# Patient Record
Sex: Female | Born: 1960
Health system: Southern US, Community
[De-identification: ages and names within clinical notes are randomized; demographics above are authoritative.]

## PROBLEM LIST (undated history)

## (undated) DIAGNOSIS — Z9109 Other allergy status, other than to drugs and biological substances: Secondary | ICD-10-CM

## (undated) DIAGNOSIS — Z973 Presence of spectacles and contact lenses: Secondary | ICD-10-CM

## (undated) DIAGNOSIS — M179 Osteoarthritis of knee, unspecified: Secondary | ICD-10-CM

## (undated) DIAGNOSIS — B259 Cytomegaloviral disease, unspecified: Secondary | ICD-10-CM

## (undated) DIAGNOSIS — S83207A Unspecified tear of unspecified meniscus, current injury, left knee, initial encounter: Secondary | ICD-10-CM

## (undated) DIAGNOSIS — K589 Irritable bowel syndrome without diarrhea: Secondary | ICD-10-CM

## (undated) DIAGNOSIS — M1710 Unilateral primary osteoarthritis, unspecified knee: Secondary | ICD-10-CM

## (undated) DIAGNOSIS — S0300XA Dislocation of jaw, unspecified side, initial encounter: Secondary | ICD-10-CM

## (undated) HISTORY — PX: URETERAL REIMPLANTION: SHX2611

## (undated) HISTORY — DX: Cytomegaloviral disease, unspecified: B25.9

## (undated) HISTORY — PX: APPENDECTOMY: SHX54

## (undated) HISTORY — DX: Irritable bowel syndrome, unspecified: K58.9

---

## 1998-02-26 ENCOUNTER — Other Ambulatory Visit: Admission: RE | Admit: 1998-02-26 | Discharge: 1998-02-26 | Payer: Self-pay | Admitting: *Deleted

## 1999-03-07 ENCOUNTER — Other Ambulatory Visit: Admission: RE | Admit: 1999-03-07 | Discharge: 1999-03-07 | Payer: Self-pay | Admitting: *Deleted

## 2001-05-25 ENCOUNTER — Other Ambulatory Visit: Admission: RE | Admit: 2001-05-25 | Discharge: 2001-05-25 | Payer: Self-pay | Admitting: Gynecology

## 2001-06-03 ENCOUNTER — Encounter: Payer: Self-pay | Admitting: Gynecology

## 2001-06-03 ENCOUNTER — Ambulatory Visit (HOSPITAL_COMMUNITY): Admission: RE | Admit: 2001-06-03 | Discharge: 2001-06-03 | Payer: Self-pay | Admitting: Gynecology

## 2001-11-16 ENCOUNTER — Encounter: Payer: Self-pay | Admitting: Emergency Medicine

## 2001-11-16 ENCOUNTER — Emergency Department (HOSPITAL_COMMUNITY): Admission: EM | Admit: 2001-11-16 | Discharge: 2001-11-16 | Payer: Self-pay | Admitting: Emergency Medicine

## 2002-04-12 ENCOUNTER — Ambulatory Visit (HOSPITAL_COMMUNITY): Admission: RE | Admit: 2002-04-12 | Discharge: 2002-04-12 | Payer: Self-pay | Admitting: Gynecology

## 2002-04-12 ENCOUNTER — Encounter: Payer: Self-pay | Admitting: Gynecology

## 2002-04-20 ENCOUNTER — Inpatient Hospital Stay (HOSPITAL_COMMUNITY): Admission: RE | Admit: 2002-04-20 | Discharge: 2002-04-22 | Payer: Self-pay | Admitting: Gynecology

## 2002-04-20 ENCOUNTER — Encounter (INDEPENDENT_AMBULATORY_CARE_PROVIDER_SITE_OTHER): Payer: Self-pay | Admitting: Specialist

## 2002-04-20 HISTORY — PX: OTHER SURGICAL HISTORY: SHX169

## 2002-06-23 ENCOUNTER — Other Ambulatory Visit: Admission: RE | Admit: 2002-06-23 | Discharge: 2002-06-23 | Payer: Self-pay | Admitting: Gynecology

## 2003-07-17 ENCOUNTER — Other Ambulatory Visit: Admission: RE | Admit: 2003-07-17 | Discharge: 2003-07-17 | Payer: Self-pay | Admitting: Gynecology

## 2004-02-01 ENCOUNTER — Encounter (INDEPENDENT_AMBULATORY_CARE_PROVIDER_SITE_OTHER): Payer: Self-pay | Admitting: *Deleted

## 2004-02-01 ENCOUNTER — Ambulatory Visit (HOSPITAL_COMMUNITY): Admission: RE | Admit: 2004-02-01 | Discharge: 2004-02-01 | Payer: Self-pay | Admitting: *Deleted

## 2004-04-23 ENCOUNTER — Ambulatory Visit (HOSPITAL_COMMUNITY): Admission: RE | Admit: 2004-04-23 | Discharge: 2004-04-23 | Payer: Self-pay | Admitting: Gynecology

## 2004-05-01 ENCOUNTER — Encounter: Admission: RE | Admit: 2004-05-01 | Discharge: 2004-05-01 | Payer: Self-pay | Admitting: Gynecology

## 2004-09-04 ENCOUNTER — Other Ambulatory Visit: Admission: RE | Admit: 2004-09-04 | Discharge: 2004-09-04 | Payer: Self-pay | Admitting: Gynecology

## 2005-09-16 ENCOUNTER — Other Ambulatory Visit: Admission: RE | Admit: 2005-09-16 | Discharge: 2005-09-16 | Payer: Self-pay | Admitting: Gynecology

## 2005-10-31 ENCOUNTER — Ambulatory Visit (HOSPITAL_BASED_OUTPATIENT_CLINIC_OR_DEPARTMENT_OTHER): Admission: RE | Admit: 2005-10-31 | Discharge: 2005-10-31 | Payer: Self-pay | Admitting: Gynecology

## 2005-10-31 ENCOUNTER — Encounter (INDEPENDENT_AMBULATORY_CARE_PROVIDER_SITE_OTHER): Payer: Self-pay | Admitting: *Deleted

## 2005-10-31 HISTORY — PX: COMBINED LAPAROSCOPY W/ HYSTEROSCOPY: SUR299

## 2006-04-14 ENCOUNTER — Encounter: Admission: RE | Admit: 2006-04-14 | Discharge: 2006-04-14 | Payer: Self-pay | Admitting: Gynecology

## 2007-01-28 ENCOUNTER — Other Ambulatory Visit: Admission: RE | Admit: 2007-01-28 | Discharge: 2007-01-28 | Payer: Self-pay | Admitting: Gynecology

## 2007-07-05 ENCOUNTER — Ambulatory Visit: Payer: Self-pay | Admitting: Cardiology

## 2007-07-08 ENCOUNTER — Ambulatory Visit: Payer: Self-pay

## 2008-03-06 ENCOUNTER — Emergency Department (HOSPITAL_BASED_OUTPATIENT_CLINIC_OR_DEPARTMENT_OTHER): Admission: EM | Admit: 2008-03-06 | Discharge: 2008-03-06 | Payer: Self-pay | Admitting: Emergency Medicine

## 2008-11-07 ENCOUNTER — Telehealth: Payer: Self-pay | Admitting: Family Medicine

## 2008-11-07 ENCOUNTER — Ambulatory Visit: Payer: Self-pay | Admitting: Family Medicine

## 2008-11-07 DIAGNOSIS — R5381 Other malaise: Secondary | ICD-10-CM | POA: Insufficient documentation

## 2008-11-07 DIAGNOSIS — R5383 Other fatigue: Secondary | ICD-10-CM

## 2008-11-08 LAB — CONVERTED CEMR LAB
Albumin: 4.4 g/dL (ref 3.5–5.2)
Alkaline Phosphatase: 70 units/L (ref 39–117)
BUN: 21 mg/dL (ref 6–23)
CO2: 25 meq/L (ref 19–32)
Calcium: 9.2 mg/dL (ref 8.4–10.5)
Chloride: 108 meq/L (ref 96–112)
Cholesterol: 184 mg/dL (ref 0–200)
Glucose, Bld: 110 mg/dL — ABNORMAL HIGH (ref 70–99)
HDL: 48 mg/dL (ref >39)
Hemoglobin: 13.4 g/dL (ref 12.0–15.0)
MCHC: 32.6 g/dL (ref 30.0–36.0)
Potassium: 4.5 meq/L (ref 3.5–5.3)
RBC: 4.6 M/uL (ref 3.87–5.11)
Triglycerides: 181 mg/dL — ABNORMAL HIGH (ref <150)
Vit D, 25-Hydroxy: 17 ng/mL — ABNORMAL LOW (ref 30–89)

## 2008-12-26 ENCOUNTER — Other Ambulatory Visit: Admission: RE | Admit: 2008-12-26 | Discharge: 2008-12-26 | Payer: Self-pay | Admitting: Gynecology

## 2008-12-26 ENCOUNTER — Ambulatory Visit: Payer: Self-pay | Admitting: Gynecology

## 2008-12-26 ENCOUNTER — Encounter: Payer: Self-pay | Admitting: Gynecology

## 2009-03-06 ENCOUNTER — Encounter: Admission: RE | Admit: 2009-03-06 | Discharge: 2009-03-06 | Payer: Self-pay | Admitting: Gynecology

## 2009-06-01 ENCOUNTER — Ambulatory Visit: Payer: Self-pay | Admitting: Gynecology

## 2009-06-05 ENCOUNTER — Ambulatory Visit: Payer: Self-pay | Admitting: Gynecology

## 2009-06-13 ENCOUNTER — Ambulatory Visit: Payer: Self-pay | Admitting: Gynecology

## 2010-01-01 ENCOUNTER — Ambulatory Visit: Payer: Self-pay | Admitting: Family Medicine

## 2010-01-01 DIAGNOSIS — L03319 Cellulitis of trunk, unspecified: Secondary | ICD-10-CM

## 2010-01-01 DIAGNOSIS — L02219 Cutaneous abscess of trunk, unspecified: Secondary | ICD-10-CM | POA: Insufficient documentation

## 2010-01-02 ENCOUNTER — Encounter: Payer: Self-pay | Admitting: Family Medicine

## 2010-07-17 ENCOUNTER — Telehealth: Payer: Self-pay | Admitting: Family Medicine

## 2010-07-17 ENCOUNTER — Ambulatory Visit: Payer: Self-pay | Admitting: Family Medicine

## 2010-07-17 DIAGNOSIS — R21 Rash and other nonspecific skin eruption: Secondary | ICD-10-CM | POA: Insufficient documentation

## 2010-07-18 ENCOUNTER — Encounter: Payer: Self-pay | Admitting: Family Medicine

## 2010-09-08 ENCOUNTER — Encounter: Payer: Self-pay | Admitting: Gynecology

## 2010-09-17 NOTE — Assessment & Plan Note (Signed)
Summary: abscess   Vital Signs:  Patient profile:   50 year old female Height:      63 inches Weight:      175 pounds BMI:     31.11 O2 Sat:      100 % on Room air Temp:     97.4 degrees F oral Pulse rate:   72 / minute BP sitting:   138 / 84  (left arm) Cuff size:   large  Vitals Entered By: Payton Spark CMA (Jan 01, 2010 48:44 PM)  O2 Flow:  Room air CC: ? abscess/bug bite on lower L back x 3 days. Has started draining.    Primary Care Provider:  Nani Gasser MD  CC:  ? abscess/bug bite on lower L back x 3 days. Has started draining. Marland Kitchen  History of Present Illness: 50 yo WF presents for an open wound over the L Lower back x 10 days that has opened and drained some yellow - clear fluid.  It is tender.  She had subjective fever and chills last night.  She is not using anything on it.  She works in Teacher, music.  Denies any bites.    Current Medications (verified): 1)  None  Allergies (verified): 1)  ! Darvocet  Past History:  Past Medical History: Reviewed history from 11/07/2008 and no changes required. Endometriosis - Did depo/Lupron in 2009  Social History: Reviewed history from 11/07/2008 and no changes required. 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  Come college. Certificate progrzam.  Married to [place] with 2 biologic and 2 step children.   Never Smoked Alcohol use-no Drug use-no Regular exercise-no  Review of Systems      See HPI  Physical Exam  General:  alert, well-developed, well-nourished, and well-hydrated.   Head:  normocephalic and atraumatic.   Skin:  color normal.   2.5 x 1.5 cm open abscess with shallow pink base over the L lower back with clear drainage.   Cervical Nodes:  No lymphadenopathy noted Psych:  good eye contact, not anxious appearing, and not depressed appearing.     Impression & Recommendations:  Problem # 1:  CELLULITIS AND ABSCESS OF TRUNK (ICD-682.2) Cleaned area with betadine and alcohol.  Opened with a TB syringe  (unroofed the top) to get a culture.  Lesion is flat and unable to I&D.  Will f/u cx results and start on Doxycycline.  Call if redenss/ pain/ fever.  Clean with antibacterial soap and water.  Cover with polysporin oitnment and a bandage. Her updated medication list for this problem includes:    Doxycycline Hyclate 100 Mg Caps (Doxycycline hyclate) .Marland Kitchen... 1 capsule by mouth two times a day x 10 days  Orders: T-Culture, Wound ([id number])  Complete Medication List: 1)  Doxycycline Hyclate 100 Mg Caps (Doxycycline hyclate) .Marland Kitchen.. 1 capsule by mouth two times a day x 10 days  Patient Instructions: 1)  Clean abscess area with warm soapy water. 2)  Cover with polysporin ointment and a bandaid. 3)  Take Doxycycline 2 x a day (with food) x 10 days. 4)  Will call you w/ culture results in 48 hrs. 5)  Use Motrin as needed for aches/ pains. Prescriptions: DOXYCYCLINE HYCLATE 100 MG CAPS (DOXYCYCLINE HYCLATE) 1 capsule by mouth two times a day x 10 days  #20 x 0   Entered and Authorized by:   Seymour Bars DO   Signed by:   Seymour Bars DO on 01/01/2010   Method used:   [facility]  [place] [phone] ([facility]       [address] [place], [place]  [address]       Ph: [phone] or [phone]       Fax: [phone]   RxID:   [id number]

## 2010-09-17 NOTE — Assessment & Plan Note (Signed)
Summary: RAsh   Vital Signs:  Patient profile:   50 year old female Height:      63 inches Weight:      174 pounds Pulse rate:   63 / minute BP sitting:   127 / 84  (right arm) Cuff size:   regular  Vitals Entered By: Avon Gully CMA, Duncan Dull) (July 17, 2010 11:43 AM) CC: red lesion on lower back that appeared last pm   Primary Care Provider:  Nani Gasser MD  CC:  red lesion on lower back that appeared last pm.  History of Present Illness: Lesion on her low back, feels likea bee sting. Not draining. This lesoin is in the exact same place as the lesion in May.  This si the thrid time had a lesion like this. with the previous two lesion she says after a few days it started draining on their own.  Typically it's a whitish or yellow drainage but then becomes clear or serous.  She said that the previous two lesions have resolved within one to two weeks.  This time she was placed on doxycycline and that did seem to clear it up pretty quickly.  The last episode was in May here in our office and she was placed on doxycycline.  She did have a culture that was positive for staph.  She denies any other infections.  She does feel slightly feverish and had some myalgias.  Current Medications (verified): 1)  None  Allergies (verified): 1)  ! Darvocet  Comments:  Nurse/Medical Assistant: The patient's medications and allergies were reviewed with the patient and were updated in the Medication and Allergy Lists. Avon Gully CMA, Duncan Dull) (July 17, 2010 11:44 AM)  Physical Exam  General:  Well-developed,well-nourished,in no acute distress; alert,appropriate and cooperative throughout examination Skin:  on her left leg back area she has an erythematous patch that approximately 3 x 4 to 5 cm.  Within that area she has very tiny flattened vesicles.  I did try it lanced a couple of these vesicles and collect a viral culture.  The sample was very small   Impression &  Recommendations:  Problem # 1:  SKIN RASH (ICD-782.1) based on exam and history I actually think this is herpes simplex virus.  I did do a herpes culture though I did not get a decent quantity of sample to send.  Thus if it is negative it doesn't rule out HSV.  A medical ahead and treat her with an antiviral as well as the doxycycline which she has used in the past.  I see no pus or drainage or any evidence that the lesion is going into the skin abscess.  The rash itself is very superficial Orders: T-Herpes Culture (Routine) ([id number])  Complete Medication List: 1)  Doxycycline Hyclate 100 Mg Caps (Doxycycline hyclate) .Marland Kitchen.. 1 capsule by mouth two times a day x 10 days 2)  Acyclovir 400 Mg Tabs (Acyclovir) .... Take 1 tablet by mouth three times a day for 5 days  Patient Instructions: 1)  We will call you with the culture results.  Prescriptions: DOXYCYCLINE HYCLATE 100 MG CAPS (DOXYCYCLINE HYCLATE) 1 capsule by mouth two times a day x 10 days  #20 x 0   Entered and Authorized by:   Nani Gasser MD   Signed by:   Nani Gasser MD on 07/17/2010   Method used:   [facility]        [facility] [phone][facility] (retail)  [address], [place]  [address]       Ph: [phone] or [phone]       Fax: [phone]   RxID:   [id number] ACYCLOVIR 400 MG TABS (ACYCLOVIR) Take 1 tablet by mouth three times a day for 5 days  #15 x 0   Entered and Authorized by:   Nani Gasser MD   Signed by:   Nani Gasser MD on 07/17/2010   Method used:   [facility]ronically to        [facility] [phone]* (retail)       [address] [place], [place]  [address]       Ph: [phone] or [phone]       Fax: [phone]   RxID:   [id number]    Orders Added: 1)  T-Herpes Culture (Routine) [[id number]] 2)  Est. Patient Level III [10258]

## 2010-09-17 NOTE — Progress Notes (Signed)
Summary: Staph inf on back  Phone Note Call from Patient   Caller: Patient Call For: Nani Gasser MD Summary of Call: Pt calls and states that the area on her left side of back is back- has had several times and treated for staph. Wanted to know if you would call in antibiotic for her since has came back again. Send to [facility] on [address] Initial call taken by: Kathlene November LPN,  July 17, 2010 8:30 AM  Follow-up for Phone Call        REally needs to come in.  Follow-up by: Nani Gasser MD,  July 17, 2010 8:54 AM  Additional Follow-up for Phone Call Additional follow up Details #1::        Called pt and notified her of above instructions. Scheduled her appt for 11:30 today Additional Follow-up by: Kathlene November LPN,  July 17, 2010 9:26 AM

## 2010-12-20 ENCOUNTER — Telehealth: Payer: Self-pay | Admitting: Family Medicine

## 2010-12-20 DIAGNOSIS — R928 Other abnormal and inconclusive findings on diagnostic imaging of breast: Secondary | ICD-10-CM

## 2010-12-20 NOTE — Telephone Encounter (Signed)
Pt called and needs an order to do diagnostic.  Original mammo showed mass LT chest wall, and they need to do diagnostic.  Pt would like an order for them to do it at [facility]/off [address]. Pt at work today [phone](downstairs). Plan:  Routed to Dr. Linford Arnold and Sue Lush, CMA Jarvis Newcomer, LPN Domingo Dimes

## 2010-12-23 ENCOUNTER — Telehealth: Payer: Self-pay | Admitting: Family Medicine

## 2010-12-23 NOTE — Telephone Encounter (Signed)
Called the patient at the work number [phone] to let her know that her diagnostic  mammography order is in the system for her to go for additional testing per her request. Plan:  Pt informed that order in system. Jarvis Newcomer, LPN Domingo Dimes

## 2010-12-23 NOTE — Telephone Encounter (Signed)
Cara from imaging in [place] calling regarding order that is in the system regarding Jayleene Glaeser  DOB April 06, 2061 .  Please call. Plan:  Call returned to [place], and regarding order placed in [facility].for diagnostic mammo.  They want to know where the last imaging was done??  Plan:  Jason Nest pts work number and told her to call pt for specific information.  Charlynne Pander said she may need to call us back. Jarvis Newcomer, LPN Domingo Dimes

## 2010-12-24 ENCOUNTER — Telehealth: Payer: Self-pay | Admitting: Family Medicine

## 2010-12-24 NOTE — Telephone Encounter (Addendum)
Order needs to be resent please and say per [facility] [place] [facility].  Thanks. Diagnostic bilateral mammo. Plan:  Routed to Dr. Marlyne Beards, LPN Domingo Dimes   This was taken care of. Jarvis Newcomer, LPN Domingo Dimes

## 2010-12-26 ENCOUNTER — Telehealth: Payer: Self-pay | Admitting: Family Medicine

## 2010-12-26 ENCOUNTER — Other Ambulatory Visit: Payer: Self-pay | Admitting: Family Medicine

## 2010-12-26 DIAGNOSIS — N63 Unspecified lump in unspecified breast: Secondary | ICD-10-CM

## 2010-12-26 NOTE — Telephone Encounter (Signed)
Order resent

## 2010-12-26 NOTE — Telephone Encounter (Addendum)
Please change order in the system for this patient so she can have her test.  Order states pt to have diagnostic limited on RT. However, order needs to change to read:  Need bilateral diagnostic per Cara at imaging center.  2nd request. Plan:  Routed to Dr. Marlyne Beards, LPN Domingo Dimes  This encounter has long been taken care of.  Closed encounter. Jarvis Newcomer, LPN Domingo Dimes

## 2010-12-31 NOTE — Assessment & Plan Note (Signed)
[facility]                            CARDIOLOGY OFFICE NOTE   CYRENE, GHARIBIAN                    MRN:          [id number]  DATE:07/05/2007                            DOB:          06-09-1961    REFERRING PHYSICIAN:  Madlyn Frankel. Charlann Boxer, M.D.   REASON FOR CONSULTATION:  Evaluate the patient with chest pain,  cardiovascular risk factors.   HISTORY OF PRESENT ILLNESS:  The patient is a 50 year old white female  without prior cardiac history.  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  She is  complaining of chest discomfort.  This has been going on for about 5  months.  It is a substernal gripping discomfort.  It seems to come and  go.  She does notice it with emotional stress, though not with physical  stress.  She is able to do some jogging without bringing this on.  When  it does come on, it again is substernal.  There is no radiation to her  jaw or to her arms.  It can be 5/10 in intensity.  It can last from  minutes to hours.  There is no associated nausea, vomiting, or  diaphoresis.  She has never had this kind of discomfort before.  She has  not tried to take anything to get rid of it.  It does go away on its  own.  She does not notice it to be positional or associated with  respiration.   Of note, she did have syncope related to a GI illness in March.  At that  time, she was seen in the ER.  There was some chest pain.  She had a  spiral CT and did not have a pulmonary embolism or other abnormalities  apparently at that time.   PAST MEDICAL HISTORY:  Endometriosis.  The patient has no history of  hypertension, diabetes, or hyperlipidemia.   PAST SURGICAL HISTORY:  Two C-sections.  Transurethral mass resected,  apparently benign.   ALLERGIES:  None.   MEDICATIONS:  Depo-Lupron.  Progestin.   SOCIAL HISTORY:  The patient is an Publishing rights manager.  She is married.  She has  2 children.  She has never smoked cigarettes.  She does not drink  alcohol.   FAMILY HISTORY:  Contributory for coronary artery disease with her  father having heart disease in his 16s.   REVIEW OF SYSTEMS:  Positive for dizziness, deviated septum.  Negative  for other systems.   PHYSICAL EXAMINATION:  The patient is in no distress.  Blood pressure 131/72, heart rate 66 and regular, weight 161 pounds,  body mass index 28.  HEENT:  Eyelids unremarkable.  Pupils are equal, round, and reactive to  light and accommodation.  Fundi within normal limits.  Oral mucosa  unremarkable.  NECK:  No jugular venous distension at 45 degrees, carotid upstroke  brisk and symmetric, no bruits, thyromegaly.  LYMPHATICS:  No cervical, axillary, or inguinal adenopathy.  LUNGS:  Clear to auscultation bilaterally.  BACK:  No costovertebral angle tenderness.  CHEST:  Unremarkable.  HEART:  PMI not displaced or sustained, S1 and S2 within normal  limits,  no S3, no S4, no clicks, rubs, murmurs.  ABDOMEN:  Obese, positive bowel sounds, normal in frequency and pitch,  no bruits, rebound, guarding.  No midline pulsatile masses,  hepatomegaly, splenomegaly.  SKIN:  No rashes, no nodules.  EXTREMITIES:  With 2+ pulses throughout, no edema, cyanosis, clubbing.  NEURO:  Oriented to person, place, and time, cranial nerves 2-12 grossly  intact, motor grossly intact.   ASSESSMENT AND PLAN:  1. Chest discomfort.  The patient's chest discomfort has some typical      and worrisome features.  It has been increasing in intensity and      frequency.  She is on hormone replacement and has a family history.      Given all of this, I think the pre-test probability of obstructive      coronary disease is somewhat moderate.  She needs to be screened      and I will perform an exercise Cardiolite.  Further evaluation      based on these results.  2. Weight.  We discussed the fact that she is overweight.  She is well      aware of her body mass index and does exercise to try to improve      this.  3.  Risk reduction.  She says she does know her lipid profile and that      it has been normal.  I would be happy to review that at her      request.  4. Followup.  I will see the patient back based on the results of the      above.     Rollene Rotunda, MD, [facility]  Electronically Signed    JH/MedQ  DD: 07/05/2007  DT: 07/05/2007  Job #: [id number]   cc:   Madlyn Frankel Charlann Boxer, M.D.

## 2011-01-03 NOTE — H&P (Signed)
Jessica Mullen, Jessica Mullen             ACCOUNT NO.:  [id number]   MEDICAL RECORD NO.:  [id number]          PATIENT TYPE:  AMB   LOCATION:  [facility]                         FACILITY:  [facility]   PHYSICIAN:  Timothy P. Fontaine, M.D.DATE OF BIRTH:  1960-10-16   DATE OF ADMISSION:  DATE OF DISCHARGE:                                HISTORY & PHYSICAL   DATE OF ADMISSION:  October 31, 2005, at 7:30 a.m.   CHIEF COMPLAINT:  1.  Pelvic pain.  2.  Endometrial cells on Pap smear.   HISTORY OF PRESENT ILLNESS:  A 50 year old G2, P2 female. History of  endometriosis with endometrioma status post left salpingo-oophorectomy 2003.  Presents with worsening left-sided pelvic pain with intercourse as well as  increasing dysmenorrhea. The patient also notes this pain with defecation  during her menses and she is admitted at this time for laser laparoscopy.  The patient also on recent annual exam had benign-appearing endometrial  cells on Pap smear. She is for hysteroscopy D&C at the same time.   PAST MEDICAL HISTORY:  Mitral valve prolapse for which she takes no  antibiotic prophylaxis.   PAST SURGICAL HISTORY:  Includes left salpingo-oophorectomy, transurethral  resection of polyp, cesarean section x2, reimplantation of left ureter.   ALLERGIES:  None.   REVIEW OF SYSTEMS:  Noncontributory.   FAMILY HISTORY:  Noncontributory.   SOCIAL HISTORY:  Noncontributory.   ADMISSION PHYSICAL EXAMINATION:  VITAL SIGNS:  Afebrile, vital signs are  stable.  HEENT:  Normal.  LUNGS:  Clear.  CARDIAC:  Regular rate without rubs, murmurs, or gallops.  ABDOMEN:  Benign.  PELVIC:  External, BUS, vagina normal. Cervix normal. Uterus grossly normal  in size, midline and mobile, nontender. Adnexa without gross masses or  tenderness.   ASSESSMENT:  A 50 year old gravida 2, para 2 female, vasectomy birth  control, left-sided pelvic pain with intercourse, increasing dysmenorrhea,  pain with defecation during menses, all  reminiscent of her prior episode of  endometriosis, for laparoscopy. She also had benign-appearing endometrial  cells on Pap smear, for hysteroscopy D&C. Risks, benefits, indications, and  alternatives for the procedures were reviewed with the patient to include  the expected intraoperative/postoperative courses. The D&C portion,  hysteroscopy, as well as the laparoscopic portion, insufflation, trocar  placement, multiple port sites, use of sharp/blunt dissection,  electrocautery, and laser were all reviewed, understood, and accepted. The  patient understands that her pain may persist, worsen, or change following  the procedure. There are no guarantees as far as pain relief. We will assess  the pelvis. If pathology is encountered we will attempt to eradicate  pathology as is felt safe to do and she understands and accepts this. The  risks of infection - both internal requiring prolonged antibiotics as well  as incisional requiring opening and draining of incisions, closure by  secondary intention, was all discussed, understood, and accepted. The risk  of hemorrhage necessitating transfusion and the risks of transfusion  including transfusion reaction, hepatitis, HIV, mad cow disease, and other  unknown entities was all discussed, understood, and accepted. The risks of  inadvertant injury to internal organs -  both immediately recognized and  delay recognized including bowel, bladder, ureters, vessels and nerves  necessitating major exploratory reparative surgeries and further reparative  surgeries including ostomy formation was all discussed, understood, and  accepted. The patient's questions are answered to her satisfaction and she  is ready to proceed with surgery.      Timothy P. Fontaine, M.D.  Electronically Signed     TPF/MEDQ  D:  10/28/2005  T:  10/28/2005  Job:  [id number]

## 2011-01-03 NOTE — Op Note (Signed)
TNAMEBRIEL, GALLICCHIO                      ACCOUNT NO.:  [id number]   MEDICAL RECORD NO.:  [id number]                   PATIENT TYPE:  INP   LOCATION:  0478                                 FACILITY:  [facility]   PHYSICIAN:  Timothy P. Fontaine, M.D.           DATE OF BIRTH:  [birth year]   DATE OF PROCEDURE:  04/20/2002  DATE OF DISCHARGE:                                 OPERATIVE REPORT   PREOPERATIVE DIAGNOSES:  Left pelvic mass.   POSTOPERATIVE DIAGNOSES:  Endometriosis with left ovarian endometrioma.   PROCEDURE:  Exploratory laparotomy, left salpingo-oophorectomy, fulguration  endometriosis.   SURGEON:  Timothy P. Fontaine, M.D.   ASSISTANTGaetano Hawthorne. Lily Peer, M.D.   ANESTHESIA:  General endotracheal.   COMPLICATIONS:  None.   ESTIMATED BLOOD LOSS:  65 cc   SPECIMENS:  Opening cell washing left salpingo-oophorectomy.   FINDINGS:  Left ovary 9-10 cystic, adherent to the pelvic sidewall ruptured  upon dissection with chocolate-like material extruding. Left fallopian tube  attenuated with a hydrosalpinx type appearance, anterior cul-de-sac normal,  posterior cul-de-sac normal, uterus normal size, shape, contour. Right  fallopian tube normal __________ and caliber fimbriated end, right ovary  grossly normal in size with several areas of superficial endometriosis which  were superficially fulgurated.   DESCRIPTION OF PROCEDURE:  The patient was taken to the operating room,  placed in the low dorsal lithotomy position, received abdominoperineal  vaginal preparation with Betadine solution, bladder emptied with indwelling  Foley catheter placement sterile technique. The patient was draped in the  usual fashion and the abdomen was sharply entered through a Pfannenstiel  incision keeping adequate hemostasis at all levels. An opening cell washing  was then obtained and subsequently a Balfour retractor and bladder blade  were placed within the incision and the intestines were  packed in the  operative site. The sigmoid colon was noted to be adherent along the left  ovary and these adhesions were sharply lysed to free up the superior aspect  of the left cystic mass. The cystic mass was adherent to the pelvic sidewall  and during the elevation and dissection, the cystic mass was ruptured noting  to have endometriotic type fluid extruding. The resection was initiated at  the uterine ovarian pedicle and this was doubly clamped, cut and ligated  using #0 Vicryl suture. The mesosalpinx broad ligament were sharply incised  to the level of the pelvic sidewall and through sharp and blunt dissection,  the ovary and fallopian tube segment were freed to the level of the  infundibulopelvic ligament and vessels. The underlying great vessels and  ureter were identified and found to be away from the surgical site. The  infundibulopelvic ligament was then clamped, cut, and doubly ligated using  #0 Vicryl suture. The specimen was then sent for frozen section diagnosis  which ultimately returned consistent with endometriosis. The pelvis was  copiously irrigated, adequate hemostasis was visualized. The bowel packing  was removed,  Balfour retractor and bladder blade removed and the anterior  fascia was reapproximated using #0 Vicryl  suture starting at the angle meeting in the middle. The subcutaneous tissues  were irrigated, adequate hemostasis achieved with electrocautery. The skin  reapproximated with staples, sterile dressing applied. The patient was taken  to the recovery room in good condition having tolerated the procedure well.                                               Timothy P. Audie Box, M.D.    TPF/MEDQ  D:  04/20/2002  T:  04/20/2002  Job:  [id number]

## 2011-01-03 NOTE — H&P (Signed)
NAME:  Jessica Mullen, Jessica Mullen                       ACCOUNT NO.:  [id number]   MEDICAL RECORD NO.:  [id number]                   PATIENT TYPE:  INP   LOCATION:  NA                                   FACILITY:  [facility]   PHYSICIAN:  Timothy P. Fontaine, M.D.           DATE OF BIRTH:  [birth year]   DATE OF ADMISSION:  04/20/2002  DATE OF DISCHARGE:                                HISTORY & PHYSICAL   CHIEF COMPLAINT:  Pelvic mass.   HISTORY OF PRESENT ILLNESS:  The patient is a 50 year old G2, P2 female  presented April 07, 2002 complaining of left flank to low back pain.  Evaluation included an initial ultrasound which showed a large mixed echo  pattern left ovarian mass measuring 9.7 cm.  Right adnexa is normal.  Cul-de-  sac showed a moderate amount of fluid.  She subsequently underwent CT scan  which confirmed the left cystic pelvic mass with some ureteral deviation but  no compression and is otherwise normal showing no adenopathy.  Serum markers  to include CA 125, CEA, alpha-fetoprotein, beta hCG were all negative.  The  patient is admitted at this time for exploratory laparotomy, left salpingo-  oophorectomy and a possible debulking procedure.   PAST MEDICAL HISTORY:  Mitral valve prolapse for which she does not take  antibiotics.   PAST SURGICAL HISTORY:  Cesarean section x2 and a transurethral resection of  polyp.   CURRENT MEDICATION:  Tylox for pain.   SOCIAL HISTORY:  Noncontributory.   FAMILY HISTORY:  Noncontributory.   ADMISSION PHYSICAL EXAMINATION:  VITAL SIGNS: Stable.  HEENT: Normal.  LUNGS: Clear.  CARDIAC: Regular rate without rubs, murmurs, or gallops.  ABDOMEN: Benign.  No masses, guarding, rebound, or organomegaly.  PELVIC: External BUS vagina normal.  Cervix normal.  Bimanual with 10-weeks-  size mass.   ASSESSMENT:  The patient is a 50 year old G2, P2 female vasectomy birth  control with large left ovarian cystic mass causing her increasing pain.   PLAN:  The patient is admitted for exploratory laparotomy and probable left  salpingo-oophorectomy.  I reviewed the situation with the patient and her  husband and the various scenarios to include exploratory laparotomy with  removal of tube and ovary on that side.  If frozen section confirms it to be  benign then we will stop there.  If frozen section is suspicious for  carcinoma or is consistent with overt carcinoma then we will proceed with a  TAH/BSO, omentectomy, lymph node sampling, appendectomy, and resection of  any carcinoma to debulk her to include the potential for bowel resection.  The risks, benefits, indications and alternatives for the various potential  surgeries were reviewed with her to include the risks of bleeding,  transfusion including transfusion reaction, hepatitis, HIV, mad cow disease  and other unknown entities.  The risks of infection both internal requiring  prolonged antibiotics as well as abscess formation requiring reoperation and  drainage of  abscesses as well as wound complications requiring opening and  draining of incisions and closure by secondary intention.  The risks of  inadvertant injury to internal organs including bowel, bladder, ureters,  vessels, and nerves necessitating major exploratory reparative surgeries and  future reparative surgeries including ostomy formation was all discussed,  understood and accepted.  The patient is on a bowel prep for this potential.  The absolute sterility of hysterectomy was reviewed with her as well as  sexuality following hysterectomy to include the potential for orgasmic  dysfunction and persistent dyspareunia.  I also reviewed that there is an  oncologist on standby that if indeed it does turn out to be cancer we need  to proceed with a staging procedure that he will scrub into the case and  participate in her care and I reviewed the risks of lymphadenectomies to  include the potential for major vascular neural  injury as well as persistent  lymphedema following the procedure.  The patient's questions were answered  to her satisfaction and she is ready to proceed with surgery.                                               Timothy P. Audie Box, M.D.    TPF/MEDQ  D:  04/19/2002  T:  04/19/2002  Job:  [id number]

## 2011-01-03 NOTE — Op Note (Signed)
Jessica Mullen, Jessica Mullen             ACCOUNT NO.:  [id number]   MEDICAL RECORD NO.:  [id number]          PATIENT TYPE:  AMB   LOCATION:  [facility]                         FACILITY:  [facility]   PHYSICIAN:  Timothy P. Fontaine, M.D.DATE OF BIRTH:  [birth year]   DATE OF PROCEDURE:  10/31/2005  DATE OF DISCHARGE:                                 OPERATIVE REPORT   PREOPERATIVE DIAGNOSES:  1.  Pelvic pain.  2.  History of endometriosis.  3.  Endometrial cells on Pap smear.   POSTOPERATIVE DIAGNOSES:  1.  Pelvic pain, history of endometriosis.  2.  Endometrial cells on Pap smear.  3.  Omental to the anterior abdominal wall adhesions.  4.  Sigmoid colon to the left pelvic sidewall adhesion. s   PROCEDURE:  Hysteroscopy D&C, laparoscopic biopsy fulguration endometriosis.   SURGEON:  Timothy P. Fontaine, M.D.   ANESTHESIA:  General.   ESTIMATED BLOOD LOSS:  Minimal.   COMPLICATIONS:  None.   SORBITOL DISCREPANCY:  Reported at 65 mL   SPECIMEN:  1.  Endometrial curetting.  2.  Peritoneal biopsy endometriosis.   FINDINGS:  Hysteroscopic, adequate normal noting fundus, anterior-posterior  uterine surfaces, right and left tubal ostia, left lower uterine segment,  endocervical canal all visualized and normal. Laparoscopic anterior cul-de-  sac with several classic powder burn lesions vesicouterine fold  representative biopsy taken. Posterior cul-de-sac with classic powder burn  lesions on both inner aspects of the right and left uterosacral ligament,  uterus normal size, midline and mobile, left adnexa, sigmoid colon adherent  to the left sidewall, tube and ovary surgically absent, right fallopian tube  normal length, caliber fimbriated end. Right ovary grossly normal in size,  free and mobile with multiple brown endometriotic superficial implants on  the capsule. Right pelvic sidewall with several classic endometriotic powder  burn implants, upper abdominal exam with omental veil adhesed  along the  anterior abdominal wall from the infraumbilical region, liver smooth, no  abnormalities. Gallbladder not visualized, no other upper abdominal adhesive  disease noted.   PROCEDURE:  The patient was taken to the operating room, underwent general  anesthesia and was placed in the dorsal lithotomy position, received an  abdominoperineal vaginal preparation with Betadine solution. Bladder emptied  with in-and-out Foley catheterization. EUA performed and the patient was  draped in the usual fashion. Cervix was visualized with a speculum, anterior  lip grasped with a single-tooth tenaculum and the cervix was gently  gradually dilated to admit the diagnostic hysteroscope. Hysteroscopy was  performed which was normal. Subsequently a sharp curettage was performed and  specimen was sent to pathology. The Hulka tenaculum was placed through the  cervix after regloving and gowning. The laparoscopic portion was begun. A  vertical infraumbilical incision was made, the Veress needle placed,  position verified with water and approximately 2-1/2 liters of carbon  dioxide gas was infused without difficulty. Laparoscopic trocar was then  placed without difficulty, its position verified visually.  A right of  midline suprapubic 5 mm port was then placed under direct visualization  after transillumination for the vessels without difficulties. Examination of  the pelvic  organs upper abdominal exam was carried out with findings noted  above. A representative biopsy of the vesicouterine endometriotic implant  was taken and subsequently all visible powder burn endometriotic implants  were bipolar fulgurated superficially. The superficial capsular ovarian  endometriosis on the right was also fulgurated, several small cystic changes  which appeared physiologic were entered to assure that these were not small  endometriomas and they were all consistent with physiologic follicles.  During the entire  fulguration process, care was made to identify and avoid  vascular and ureteral structures. The left sidewall sigmoid area was  inspected and was found to be firmly adherent and was left undisturbed. The  omental veil adherent anteriorly was likewise inspected, there is no  evidence of bile involving the area and the veil was left undisturbed. The  right suprapubic port was removed, the gas allowed to slowly escape,  adequate hemostasis inspected at the port sites as well as the peritoneal  biopsy site. The infraumbilical port was then backed out under direct  visualization showing adequate hemostasis and no evidence of hernia  formation. A #0 Vicryl interrupted subcutaneous fascial stitch was placed  infraumbilically, both port sites injected using 0.25% Marcaine and both  skin incisions closed using Dermabond skin adhesive. The Hulka tenaculum was  removed. The patient placed in the supine position, awakened without  difficulty and taken to the recovery room in good condition having tolerated  the procedure well.      Timothy P. Fontaine, M.D.  Electronically Signed     TPF/MEDQ  D:  10/31/2005  T:  11/01/2005  Job:  [id number]

## 2011-01-06 ENCOUNTER — Ambulatory Visit
Admission: RE | Admit: 2011-01-06 | Discharge: 2011-01-06 | Disposition: A | Discharge status: Home / Self Care | Payer: PRIVATE HEALTH INSURANCE | Source: Ambulatory Visit | Attending: Family Medicine | Admitting: Family Medicine

## 2011-01-06 DIAGNOSIS — N63 Unspecified lump in unspecified breast: Secondary | ICD-10-CM

## 2011-01-07 ENCOUNTER — Telehealth: Payer: Self-pay | Admitting: Family Medicine

## 2011-01-07 NOTE — Telephone Encounter (Signed)
Call patient: Left breast ultrasound showed probably a fatty tumor called a lipoma. These are completely benign. They recommend is to followup mammogram in 1 year.

## 2011-01-08 NOTE — Telephone Encounter (Signed)
Called and left message on pt's cell with results and to call me back with any questions

## 2011-05-16 LAB — BASIC METABOLIC PANEL
BUN: 21
CO2: 29
Chloride: 104
Creatinine, Ser: 0.8
Potassium: 3.8

## 2011-05-16 LAB — PREGNANCY, URINE: Preg Test, Ur: NEGATIVE

## 2011-05-16 LAB — DIFFERENTIAL
Eosinophils Relative: 1
Lymphocytes Relative: 22
Lymphs Abs: 1.9
Monocytes Absolute: 0.5
Neutro Abs: 6.5

## 2011-05-16 LAB — CBC
HCT: 42
Hemoglobin: 14.7
RBC: 4.8
WBC: 8.9

## 2011-05-16 LAB — URINALYSIS, ROUTINE W REFLEX MICROSCOPIC
Glucose, UA: NEGATIVE
Hgb urine dipstick: NEGATIVE
Protein, ur: NEGATIVE
pH: 6.5

## 2011-08-27 ENCOUNTER — Encounter: Payer: Self-pay | Admitting: Emergency Medicine

## 2011-08-27 ENCOUNTER — Emergency Department
Admission: EM | Admit: 2011-08-27 | Discharge: 2011-08-27 | Disposition: A | Discharge status: Home / Self Care | Payer: PRIVATE HEALTH INSURANCE | Source: Home / Self Care | Attending: Family Medicine | Admitting: Family Medicine

## 2011-08-27 DIAGNOSIS — R6889 Other general symptoms and signs: Secondary | ICD-10-CM

## 2011-08-27 DIAGNOSIS — J111 Influenza due to unidentified influenza virus with other respiratory manifestations: Secondary | ICD-10-CM

## 2011-08-27 DIAGNOSIS — H6123 Impacted cerumen, bilateral: Secondary | ICD-10-CM

## 2011-08-27 DIAGNOSIS — H6120 Impacted cerumen, unspecified ear: Secondary | ICD-10-CM

## 2011-08-27 MED ORDER — AMOXICILLIN 875 MG PO TABS: 875.0000 mg | ORAL_TABLET | Freq: Two times a day (BID) | ORAL | Status: AC

## 2011-08-27 NOTE — ED Notes (Signed)
Began ear lavage and worked on Biomedical engineer for approximately 10 minutes each.  Removed a small amount of wax from each ear and was able to see the ear drum but patient was unable to tolerate the entire procedure so the full impaction was not removed.

## 2011-08-27 NOTE — ED Provider Notes (Signed)
History     CSN: [id number]  Arrival date & time 08/27/11  1118   First MD Initiated Contact with Patient 08/27/11 1237      Chief Complaint  Patient presents with  . URI      HPI Comments: HPI : Flu symptoms started 5 days ago.  Developed chills, sweats, myalgias, fatigue, headache, and nasal congestion.  Symptoms worsened and she developed a nonproductive cough.  Has decreased appetite but no nausea/vomiting.  She remains fatigued.  No pleuritic pain or shortness of breath.  She did have a flu shot this season. She notes that her ears feel more full than usual.     History reviewed. No pertinent past medical history.  Past Surgical History  Procedure Date  . Cesarean section   . Gyn surgery     Cystoscopy, ureter reimplantion, ovarian mass removal, oophrectomy, hysteroscopy    No family history on file.  History  Substance Use Topics  . Smoking status: Not on file  . Smokeless tobacco: Not on file  . Alcohol Use:     OB History    Grav Para Term Preterm Abortions TAB SAB Ect Mult Living                  Review of Systems + sore throat initially + cough No pleuritic pain No wheezing + nasal congestion ? post-nasal drainage No sinus pain/pressure No itchy/red eyes ? earache No hemoptysis No SOB No fever, + chills No nausea No vomiting No abdominal pain No diarrhea No urinary symptoms No skin rashes + fatigue + myalgias + headache Used OTC meds without relief  Allergies  Propoxyphene n-acetaminophen  Home Medications   Current Outpatient Rx  Name Route Sig Dispense Refill  . ACETAMINOPHEN 325 MG PO TABS Oral Take 650 mg by mouth every 6 (six) hours as needed.    Marland Kitchen PSEUDOEPHEDRINE-APAP-DM 13-086-57 MG/30ML PO LIQD Oral Take by mouth.    . AMOXICILLIN 875 MG PO TABS Oral Take 1 tablet (875 mg total) by mouth 2 (two) times daily. 20 tablet 0    BP 124/81  Pulse 70  Temp(Src) 98.2 F (36.8 C) (Tympanic)  Resp 16  Ht 5\' 3"  (1.6 m)  Wt 151  lb (68.493 kg)  BMI 26.75 kg/m2  SpO2 99%  Physical Exam Nursing notes and Vital Signs reviewed. Appearance:  Patient appears healthy, stated age, and in no acute distress Eyes:  Pupils are equal, round, and reactive to light and accomodation.  Extraocular movement is intact.  Conjunctivae are not inflamed  Ears:  Canals occluded bilaterally with cerumen.  Unable to clear with lavage Nose:  Mildly congested turbinates.  No sinus tenderness.  Pharynx:  Normal Neck:  Supple.  Slightly tender shotty anterior/posterior nodes are palpated bilaterally  Lungs:  Clear to auscultation.  Breath sounds are equal.  Heart:  Regular rate and rhythm without murmurs, rubs, or gallops.  Abdomen:  Nontender without masses or hepatosplenomegaly.  Bowel sounds are present.  No CVA or flank tenderness.  Extremities:  No edema.  No calf tenderness Skin:  No rash present.   ED Course  Procedures  none      1. Influenza-like illness   2. Impacted cerumen of both ears       MDM  ? Influenza; too late for Tamiflu Will assume patient has bilateral otitis media since she has had increased ear discomfort, and unable to clear cerumen. Take Mucinex D (guaifenesin with decongestant) twice daily for congestion.  Increase fluid intake, rest. May use Afrin nasal spray (or generic oxymetazoline) twice daily for about 5 days.  Also recommend using saline nasal spray several times daily and/or saline nasal irrigation. May take Delsym Cough Suppressant at bedtime for nighttime cough.  Stop all antihistamines for now, and other non-prescription cough/cold preparations. Followup with ENT in about one week to clear bilateral cerumen.        Donna Christen, MD 08/27/11 1352

## 2011-08-27 NOTE — ED Notes (Signed)
Fever, cough, headache, congestion x 5 days

## 2011-08-29 ENCOUNTER — Telehealth: Payer: Self-pay | Admitting: Family Medicine

## 2012-01-29 DIAGNOSIS — I341 Nonrheumatic mitral (valve) prolapse: Secondary | ICD-10-CM | POA: Insufficient documentation

## 2012-01-29 DIAGNOSIS — N809 Endometriosis, unspecified: Secondary | ICD-10-CM | POA: Insufficient documentation

## 2012-02-03 ENCOUNTER — Other Ambulatory Visit (HOSPITAL_COMMUNITY)
Admission: RE | Admit: 2012-02-03 | Discharge: 2012-02-03 | Disposition: A | Discharge status: Home / Self Care | Payer: PRIVATE HEALTH INSURANCE | Source: Ambulatory Visit | Attending: Gynecology | Admitting: Gynecology

## 2012-02-03 ENCOUNTER — Ambulatory Visit (INDEPENDENT_AMBULATORY_CARE_PROVIDER_SITE_OTHER): Payer: PRIVATE HEALTH INSURANCE | Admitting: Gynecology

## 2012-02-03 ENCOUNTER — Encounter: Payer: Self-pay | Admitting: Gynecology

## 2012-02-03 VITALS — BP 116/78 | Ht 62.0 in | Wt 143.0 lb

## 2012-02-03 DIAGNOSIS — Z1159 Encounter for screening for other viral diseases: Secondary | ICD-10-CM | POA: Insufficient documentation

## 2012-02-03 DIAGNOSIS — N9089 Other specified noninflammatory disorders of vulva and perineum: Secondary | ICD-10-CM

## 2012-02-03 DIAGNOSIS — Z113 Encounter for screening for infections with a predominantly sexual mode of transmission: Secondary | ICD-10-CM

## 2012-02-03 DIAGNOSIS — Z131 Encounter for screening for diabetes mellitus: Secondary | ICD-10-CM

## 2012-02-03 DIAGNOSIS — N907 Vulvar cyst: Secondary | ICD-10-CM

## 2012-02-03 DIAGNOSIS — Z13220 Encounter for screening for lipoid disorders: Secondary | ICD-10-CM

## 2012-02-03 DIAGNOSIS — Z01419 Encounter for gynecological examination (general) (routine) without abnormal findings: Secondary | ICD-10-CM

## 2012-02-03 DIAGNOSIS — IMO0002 Reserved for concepts with insufficient information to code with codable children: Secondary | ICD-10-CM

## 2012-02-03 LAB — CBC WITH DIFFERENTIAL/PLATELET
Basophils Relative: 0 % (ref 0–1)
HCT: 40.9 % (ref 36.0–46.0)
Hemoglobin: 13.8 g/dL (ref 12.0–15.0)
Lymphocytes Relative: 25 % (ref 12–46)
Lymphs Abs: 2.2 10*3/uL (ref 0.7–4.0)
MCHC: 33.7 g/dL (ref 30.0–36.0)
Monocytes Absolute: 0.5 10*3/uL (ref 0.1–1.0)
Monocytes Relative: 5 % (ref 3–12)
Neutro Abs: 6 10*3/uL (ref 1.7–7.7)
Neutrophils Relative %: 69 % (ref 43–77)
RBC: 4.55 MIL/uL (ref 3.87–5.11)

## 2012-02-03 NOTE — Patient Instructions (Signed)
Follow up for ultrasound as scheduled 

## 2012-02-03 NOTE — Progress Notes (Signed)
Jessica Mullen 1961-03-25 [id number]        51 y.o.  for annual exam.  Several issues noted below.  Past medical history,surgical history, medications, allergies, family history and social history were all reviewed and documented in the EPIC chart. ROS:  Was performed and pertinent positives and negatives are included in the history.  Exam: Sherrilyn Rist chaperone present Filed Vitals:   02/03/12 1508  BP: 116/78   General appearance  Normal Skin grossly normal Head/Neck normal with no cervical or supraclavicular adenopathy thyroid normal Lungs  clear Cardiac RR, without RMG Abdominal  soft, nontender, without masses, organomegaly or hernia Breasts  examined lying and sitting without masses, retractions, discharge or axillary adenopathy. Pelvic  Ext/BUS/vagina normal   Cervix normal Pap/HPV, GC/Chlamydia  Uterus anteverted, normal size, shape and contour, midline and mobile nontender   Adnexa  Without masses or tenderness    Anus and perineum  normal   Rectovaginal  normal sphincter tone without palpated masses or tenderness.    Assessment/Plan:  51 y.o. female for annual exam.   Several years since her last exam. 1. Amenorrhea. Patient has not had a period in several years. The last time we saw her her [facility] was elevated and her periods are becoming more sporadic consistent with menopausal changes. She's not having significant hot flushes night sweats now and we'll continue to monitor. 2. Dyspareunia. Patient has deep dyspareunia as something is being hit with every coital episode. On exam it is reproduced with manipulation of her cervix. She does have a history of endometriosis with last laparoscopy in 2007 showing endometriosis in the anterior cul-de-sac/posterior cul-de-sac/right adnexa. Recommend start with ultrasound now. Possibilities for relaparoscopy up to and including hysterectomy was discussed. 3. Infidelity. Patient recently found out her husband has spent time with another woman  although denies sexual relations. GC Chlamydia screen done today. Offered blood studies to include HIV RPR hepatitis B and C and she declined these at this time.  We'll follow up if she changes her mind. 4. Vulvar cyst. Patient has a classic small sebaceous cyst right mid to lower labia majora. It is not bothersome to her and she'll continue to monitor.  Follow up if it changes or enlarges. 5. Pap smear. No history of abnormal Pap smears with last Pap smear 2010. Pap/HPV done today. 6. Mammography. Patient is due for her mammogram now I reminded her to schedule this. SBE monthly reviewed. 7. Health maintenance. Patient has not seen her primary physician in over a year. Baseline CBC lipid profile glucose and urinalysis ordered.    Dara Lords MD, 4:01 PM 02/03/2012

## 2012-02-03 NOTE — Addendum Note (Signed)
Addended by: Richardson Chiquito on: 02/03/2012 04:31 PM   Modules accepted: Orders

## 2012-02-04 LAB — URINALYSIS W MICROSCOPIC + REFLEX CULTURE
Bacteria, UA: NONE SEEN
Bilirubin Urine: NEGATIVE
Glucose, UA: NEGATIVE mg/dL
Hgb urine dipstick: NEGATIVE
Ketones, ur: NEGATIVE mg/dL
Protein, ur: NEGATIVE mg/dL
Squamous Epithelial / LPF: NONE SEEN
Urobilinogen, UA: 0.2 mg/dL (ref 0.0–1.0)

## 2012-02-04 LAB — LIPID PANEL
Cholesterol: 186 mg/dL (ref 0–200)
HDL: 56 mg/dL (ref >39)
Total CHOL/HDL Ratio: 3.3 Ratio
Triglycerides: 142 mg/dL (ref <150)
VLDL: 28 mg/dL (ref 0–40)

## 2012-02-04 LAB — GLUCOSE, RANDOM: Glucose, Bld: 92 mg/dL (ref 70–99)

## 2012-02-04 LAB — GC/CHLAMYDIA PROBE AMP, GENITAL: GC Probe Amp, Genital: NEGATIVE

## 2012-02-11 ENCOUNTER — Ambulatory Visit (INDEPENDENT_AMBULATORY_CARE_PROVIDER_SITE_OTHER): Payer: PRIVATE HEALTH INSURANCE | Admitting: Gynecology

## 2012-02-11 ENCOUNTER — Encounter: Payer: Self-pay | Admitting: Gynecology

## 2012-02-11 ENCOUNTER — Ambulatory Visit (INDEPENDENT_AMBULATORY_CARE_PROVIDER_SITE_OTHER): Payer: PRIVATE HEALTH INSURANCE

## 2012-02-11 DIAGNOSIS — IMO0002 Reserved for concepts with insufficient information to code with codable children: Secondary | ICD-10-CM

## 2012-02-11 DIAGNOSIS — N809 Endometriosis, unspecified: Secondary | ICD-10-CM

## 2012-02-11 NOTE — Patient Instructions (Signed)
Follow up if you want to consider surgery for the pain.

## 2012-02-11 NOTE — Progress Notes (Signed)
Patient presents for ultrasound due to her history of deep dyspareunia and endometriosis. Ultrasound is normal showing endometrial echo 1.9 mm. Normal appearing homogeneous uterine echotexture. Right ovary is normal. She is status post LSO with left adnexa negative. Cul-de-sac is negative. Reviewed this with the patient. Options for management reviewed to include observation versus surgical evaluation such as laparoscopy up to and including hysterectomy. With her history of endometriosis certainly reasonable I think to proceed with hysterectomy if she's having persistent deep dyspareunia reproduced with cervical manipulation. More conservative laparoscopy also offered although the question is what intervention could be made that would relieve her pain with laparoscopy and she would have to face the possibility of persistent dyspareunia despite laparoscopy. Ultimately no guarantees even with hysterectomy and she may have persistent dyspareunia. Patient wants to monitor at present will follow up if it continues to be an issue and she wants to pursue surgery.

## 2012-03-03 ENCOUNTER — Telehealth: Payer: Self-pay | Admitting: Cardiology

## 2012-03-03 NOTE — Telephone Encounter (Signed)
Pt has been experienceing chest pain for the last couple of weeks but she thinks it is due to stress but was told to get an ekg to check it out

## 2012-03-04 NOTE — Telephone Encounter (Signed)
Pt scheduled for an appointment 

## 2012-04-13 ENCOUNTER — Ambulatory Visit: Payer: PRIVATE HEALTH INSURANCE | Admitting: Cardiology

## 2012-04-30 ENCOUNTER — Ambulatory Visit (INDEPENDENT_AMBULATORY_CARE_PROVIDER_SITE_OTHER): Payer: PRIVATE HEALTH INSURANCE | Admitting: Physician Assistant

## 2012-04-30 ENCOUNTER — Encounter: Payer: Self-pay | Admitting: Physician Assistant

## 2012-04-30 VITALS — BP 104/65 | HR 86 | Temp 97.5°F | Wt 145.0 lb

## 2012-04-30 DIAGNOSIS — R55 Syncope and collapse: Secondary | ICD-10-CM

## 2012-04-30 DIAGNOSIS — I341 Nonrheumatic mitral (valve) prolapse: Secondary | ICD-10-CM

## 2012-04-30 DIAGNOSIS — R21 Rash and other nonspecific skin eruption: Secondary | ICD-10-CM

## 2012-04-30 DIAGNOSIS — J309 Allergic rhinitis, unspecified: Secondary | ICD-10-CM

## 2012-04-30 DIAGNOSIS — R300 Dysuria: Secondary | ICD-10-CM

## 2012-04-30 DIAGNOSIS — I059 Rheumatic mitral valve disease, unspecified: Secondary | ICD-10-CM

## 2012-04-30 LAB — POCT URINALYSIS DIPSTICK
Bilirubin, UA: NEGATIVE
Glucose, UA: NEGATIVE
Ketones, UA: NEGATIVE
Spec Grav, UA: <=1.005
pH, UA: 5.5

## 2012-04-30 MED ORDER — FLUTICASONE PROPIONATE 50 MCG/ACT NA SUSP: 2.0000 | Freq: Every day | NASAL | Status: DC

## 2012-04-30 NOTE — Progress Notes (Signed)
Subjective:    Patient ID: Jessica Mullen, female    DOB: September 24, 1960, 51 y.o.   MRN: [id number]  HPI Patient presents to the clinic to follow up on a syncopal episode that happened on Wednesday night 2 days ago. She has had syncopal episodes before when she had an ovarian cyst and endometrosis. She seems to be prone to vasovagal episodes when something else is going on in her body. She denies any other pain today. She does have MVP and it does not seemed to been evaluated recently.   Episode happened 2 days ago while at a concert outside and it was hot. She had had one small alcoholic beverage 2 hours prior. She was standing with her husband leaning on him when she said she didn't feel good. Then per husband stated she passed out and he laid her to the ground. She was out per husband 20-30s but had a pulse the whole time but was in the 50's. Once she came to her pulse went up to 120's. She did not void on herself on experience any convulsions. Pt refused to go to the hospital and wanted to stay for concert. She has had some sinus pressure lately and tiny red spots over legs and arms. She denies itching, nausea, vomiting. Has hx of allergies and she does feel like her allergies are worse. She denies fever, chills, muscle aches. She has had a little discomfort when urinating. She denies any CP, SOB, palpitations. She does feel very worn out since episode.   She does not smoke.     Review of Systems     Objective:   Physical Exam  Constitutional: She is oriented to person, place, and time. She appears well-nourished.  HENT:  Head: Normocephalic and atraumatic.  Right Ear: External ear normal.  Left Ear: External ear normal.  Mouth/Throat: Oropharynx is clear and moist. No oropharyngeal exudate.       TM's normal bilaterally.  Negative for maxillary tenderness or frontal tenderness.  Turbinates red and swollen.   Eyes: Conjunctivae normal and EOM are normal. Pupils are equal, round, and  reactive to light.  Neck: Normal range of motion. Neck supple. No thyromegaly present.  Cardiovascular: Normal rate, regular rhythm and intact distal pulses.        Mid systolic click.  Pulmonary/Chest: Effort normal and breath sounds normal. She has no wheezes.       No CVA tenderness.  Abdominal: Bowel sounds are normal. She exhibits no distension and no mass. There is no tenderness. There is no rebound and no guarding.  Lymphadenopathy:    She has no cervical adenopathy.  Neurological: She is alert and oriented to person, place, and time. No cranial nerve deficit.  Skin:       Small macular red spots suspect petechia on both legs and arms.   Psychiatric: She has a normal mood and affect. Her behavior is normal.          Assessment & Plan:  Syncope/Rash/dysuria- UA sent to culture to rule out any infection. EKG: NSR/Good r wave progression/no ST changes/ Normal axis. RAsh suspect petechia and want to check platelets and liver. Has hx of MVP will send to cardiology to get holter monitor and possible echo evaluation of MVP to make sure not worsening. Will get labs to check hgb, WBC, electrolytes. Encouraged her to stay hydrated. Stay away from decongestants. Go to ER with anymore syncopal episodes. Will give nasal spray to use daily and can start Zyrtec  for allergies. If cardiology checks out and epsiodes continue to happen you need a follow up visit.

## 2012-04-30 NOTE — Patient Instructions (Addendum)
Needs appt with Cardiology. Will call with labs. Go to ER for anymore syncope episodes.  Stay away decongestants. Stay hydrated.  Can use zyrtec and will give nasal spray to use daily.

## 2012-05-01 LAB — CBC WITH DIFFERENTIAL/PLATELET
Basophils Absolute: 0.2 10*3/uL — ABNORMAL HIGH (ref 0.0–0.1)
Basophils Relative: 4 % — ABNORMAL HIGH (ref 0–1)
Eosinophils Relative: 2 % (ref 0–5)
HCT: 38.3 % (ref 36.0–46.0)
Lymphocytes Relative: 53 % — ABNORMAL HIGH (ref 12–46)
MCHC: 34.2 g/dL (ref 30.0–36.0)
Monocytes Absolute: 0.4 10*3/uL (ref 0.1–1.0)
Neutro Abs: 1.5 10*3/uL — ABNORMAL LOW (ref 1.7–7.7)
Platelets: 162 10*3/uL (ref 150–400)
RDW: 13 % (ref 11.5–15.5)
WBC: 4.7 10*3/uL (ref 4.0–10.5)

## 2012-05-01 LAB — COMPREHENSIVE METABOLIC PANEL
ALT: 49 U/L — ABNORMAL HIGH (ref 0–35)
AST: 28 U/L (ref 0–37)
Alkaline Phosphatase: 67 U/L (ref 39–117)
BUN: 12 mg/dL (ref 6–23)
Creat: 0.76 mg/dL (ref 0.50–1.10)

## 2012-05-02 LAB — URINE CULTURE: Organism ID, Bacteria: NO GROWTH

## 2012-05-06 ENCOUNTER — Emergency Department (HOSPITAL_COMMUNITY)
Admission: EM | Admit: 2012-05-06 | Discharge: 2012-05-06 | Disposition: A | Discharge status: Home / Self Care | Payer: PRIVATE HEALTH INSURANCE | Attending: Emergency Medicine | Admitting: Emergency Medicine

## 2012-05-06 ENCOUNTER — Encounter (HOSPITAL_COMMUNITY): Payer: Self-pay | Admitting: Emergency Medicine

## 2012-05-06 ENCOUNTER — Emergency Department (HOSPITAL_COMMUNITY): Payer: PRIVATE HEALTH INSURANCE

## 2012-05-06 DIAGNOSIS — R509 Fever, unspecified: Secondary | ICD-10-CM | POA: Insufficient documentation

## 2012-05-06 DIAGNOSIS — R404 Transient alteration of awareness: Secondary | ICD-10-CM | POA: Insufficient documentation

## 2012-05-06 DIAGNOSIS — R21 Rash and other nonspecific skin eruption: Secondary | ICD-10-CM | POA: Insufficient documentation

## 2012-05-06 DIAGNOSIS — R4020 Unspecified coma: Secondary | ICD-10-CM

## 2012-05-06 DIAGNOSIS — R51 Headache: Secondary | ICD-10-CM | POA: Insufficient documentation

## 2012-05-06 HISTORY — DX: Other allergy status, other than to drugs and biological substances: Z91.09

## 2012-05-06 LAB — CSF CELL COUNT WITH DIFFERENTIAL
Tube #: 4
WBC, CSF: 1 /mm3 (ref 0–5)

## 2012-05-06 LAB — BASIC METABOLIC PANEL
BUN: 14 mg/dL (ref 6–23)
Chloride: 99 mEq/L (ref 96–112)
Creatinine, Ser: 0.83 mg/dL (ref 0.50–1.10)
GFR calc Af Amer: >90 mL/min (ref >90)
Glucose, Bld: 124 mg/dL — ABNORMAL HIGH (ref 70–99)
Potassium: 3.9 mEq/L (ref 3.5–5.1)

## 2012-05-06 LAB — CBC WITH DIFFERENTIAL/PLATELET
Basophils Absolute: 0.2 10*3/uL — ABNORMAL HIGH (ref 0.0–0.1)
Basophils Relative: 3 % — ABNORMAL HIGH (ref 0–1)
Lymphocytes Relative: 48 % — ABNORMAL HIGH (ref 12–46)
Lymphs Abs: 3.9 10*3/uL (ref 0.7–4.0)
MCHC: 34.2 g/dL (ref 30.0–36.0)
Monocytes Relative: 6 % (ref 3–12)
Neutro Abs: 3.4 10*3/uL (ref 1.7–7.7)
RBC: 4.19 MIL/uL (ref 3.87–5.11)
RDW: 13.4 % (ref 11.5–15.5)

## 2012-05-06 LAB — URINALYSIS, ROUTINE W REFLEX MICROSCOPIC
Bilirubin Urine: NEGATIVE
Glucose, UA: NEGATIVE mg/dL
Ketones, ur: NEGATIVE mg/dL
Specific Gravity, Urine: 1.019 (ref 1.005–1.030)
pH: 6 (ref 5.0–8.0)

## 2012-05-06 LAB — GRAM STAIN

## 2012-05-06 LAB — URINE MICROSCOPIC-ADD ON

## 2012-05-06 LAB — PROTEIN AND GLUCOSE, CSF: Glucose, CSF: 64 mg/dL (ref 43–76)

## 2012-05-06 MED ORDER — DOXYCYCLINE HYCLATE 100 MG PO TABS
100.0000 mg | ORAL_TABLET | Freq: Once | ORAL | Status: AC
  Administered 2012-05-06: 100 mg via ORAL
  Filled 2012-05-06: qty 1

## 2012-05-06 MED ORDER — LIDOCAINE HCL 2 % IJ SOLN
20.0000 mL | Freq: Once | INTRAMUSCULAR | Status: AC
  Administered 2012-05-06: 400 mg
  Filled 2012-05-06: qty 20

## 2012-05-06 MED ORDER — DOXYCYCLINE HYCLATE 100 MG PO CAPS: 100.0000 mg | ORAL_CAPSULE | Freq: Two times a day (BID) | ORAL | Status: DC

## 2012-05-06 NOTE — ED Provider Notes (Signed)
History     CSN: [id number]  Arrival date & time 05/06/12  4098   First MD Initiated Contact with Patient 05/06/12 [phone]      Chief Complaint  Patient presents with  . Loss of Consciousness    (Consider location/radiation/quality/duration/timing/severity/associated sxs/prior treatment) HPI 51 y.o. female INAD c/o LOC with prodrome of nausea this AM while in the shower. Husband states that he saw all extremities move similar to seizure, states she was out for 15 s with no post-ictal confusion.  No h/o seizure d/o. Pt also lost consciousness 8 days ago while at a concert. At that time she displayed the same movement to all extremities with no loss of bowel or bladder control, but Pt did have 15 minutes of postictal confusion. Fever to 101.8 last night. Pt has had petechial rash x7 days to legs and arms. She also reports fatigue, HA (releived by caffeine, and exedrine) 5/10, "vice like" to frontal area moving to occipital area x7days, not excarbated by valsa. Pt has noticed that her HR is faster than normal with palpations. Denies chest pain, SOB, emesis, tick exposure.    Past Medical History  Diagnosis Date  . Endometriosis   . MVP (mitral valve prolapse)   . Environmental allergies     Past Surgical History  Procedure Date  . Transurethral resection of bladder   . Cesarean section     X2  . Ureteral reimplantion     LEFT  . Pelvic laparoscopy 10/2005    ENDOMETRIOSIS  . Cystoscopy   . Oophorectomy 04/2002    LSO    Family History  Problem Relation Age of Onset  . Hypertension Mother   . Diabetes Father   . Cancer Father     lung, leukemia  . Cancer Maternal Grandfather     COLON    History  Substance Use Topics  . Smoking status: Never Smoker   . Smokeless tobacco: Not on file  . Alcohol Use: No    OB History    Grav Para Term Preterm Abortions TAB SAB Ect Mult Living   2 2   0     2      Review of Systems  Constitutional: Negative for fever.    Respiratory: Negative for shortness of breath.   Cardiovascular: Negative for chest pain.  Gastrointestinal: Negative for nausea, vomiting, abdominal pain and diarrhea.  Neurological: Positive for syncope.  All other systems reviewed and are negative.    Allergies  Bee venom and Propoxyphene-acetaminophen  Home Medications   Current Outpatient Rx  Name Route Sig Dispense Refill  . ACETAMINOPHEN 500 MG PO TABS Oral Take 1,000 mg by mouth once.    Marland Kitchen CETIRIZINE HCL 5 MG PO TABS Oral Take 5 mg by mouth daily.    Marland Kitchen FLUTICASONE PROPIONATE 50 MCG/ACT NA SUSP Nasal Place 2 sprays into the nose daily.      BP 109/67  Pulse 96  Temp 99.4 F (37.4 C) (Oral)  Resp 20  Ht 5\' 3"  (1.6 m)  Wt 145 lb (65.772 kg)  BMI 25.69 kg/m2  SpO2 96%  Physical Exam  Nursing note and vitals reviewed. Constitutional: She is oriented to person, place, and time. She appears well-developed and well-nourished. No distress.  HENT:  Head: Normocephalic and atraumatic.  Mouth/Throat: Oropharynx is clear and moist.  Eyes: Conjunctivae normal and EOM are normal. Pupils are equal, round, and reactive to light.  Neck: Normal range of motion. No JVD present.  No meningeal signs  Cardiovascular: Normal rate, regular rhythm and intact distal pulses.   Pulmonary/Chest: Effort normal and breath sounds normal. No stridor. No respiratory distress. She has no wheezes. She has no rales. She exhibits no tenderness.  Abdominal: Soft. Bowel sounds are normal. She exhibits no distension and no mass. There is no tenderness. There is no rebound and no guarding.  Musculoskeletal: Normal range of motion.  Neurological: She is alert and oriented to person, place, and time.       Cranial nerves III through XII intact, strength 5 out of 5x4 extremities, negative pronator drift, finger to nose and heel-to-shin coordinated, sensation intact to pinprick and light touch, gait is coordinated and Romberg is negative.   Skin: Skin  is warm and dry.  Psychiatric: She has a normal mood and affect.    ED Course  LUMBAR PUNCTURE Date/Time: 05/06/2012 11:24 AM Performed by: Wynetta Emery Authorized by: Wynetta Emery Consent: Verbal consent obtained. Written consent obtained. Risks and benefits: risks, benefits and alternatives were discussed Consent given by: patient Patient understanding: patient states understanding of the procedure being performed Patient consent: the patient's understanding of the procedure matches consent given Procedure consent: procedure consent matches procedure scheduled Relevant documents: relevant documents present and verified Test results: test results available and properly labeled Site marked: the operative site was marked Imaging studies: imaging studies available Required items: required blood products, implants, devices, and special equipment available Patient identity confirmed: arm band Time out: Immediately prior to procedure a "time out" was called to verify the correct patient, procedure, equipment, support staff and site/side marked as required. Indications: evaluation for infection Anesthesia: local infiltration Local anesthetic: lidocaine 2% without epinephrine Patient sedated: no Preparation: Patient was prepped and draped in the usual sterile fashion. Lumbar space: L4-L5 interspace Patient's position: sitting Needle gauge: 20 Needle type: spinal needle - Quincke tip Number of attempts: 1 Fluid appearance: clear Tubes of fluid: 4 Total volume: 5 ml Post-procedure: site cleaned and adhesive bandage applied Patient tolerance: Patient tolerated the procedure well with no immediate complications.   (including critical care time)    Labs Reviewed  CBC WITH DIFFERENTIAL - Abnormal; Notable for the following:    Platelets 139 (*)     Neutrophils Relative 42 (*)     Lymphocytes Relative 48 (*)     Basophils Relative 3 (*)     Basophils Absolute 0.2 (*)      All other components within normal limits  BASIC METABOLIC PANEL - Abnormal; Notable for the following:    Glucose, Bld 124 (*)     GFR calc non Af Amer 81 (*)     All other components within normal limits  URINALYSIS, ROUTINE W REFLEX MICROSCOPIC - Abnormal; Notable for the following:    Hgb urine dipstick TRACE (*)     Leukocytes, UA SMALL (*)     All other components within normal limits  URINE MICROSCOPIC-ADD ON - Abnormal; Notable for the following:    Bacteria, UA MANY (*)  MANY   All other components within normal limits  CSF CELL COUNT WITH DIFFERENTIAL - Abnormal; Notable for the following:    RBC Count, CSF 1 (*)     All other components within normal limits  GRAM STAIN  PROTEIN AND GLUCOSE, CSF  CSF CULTURE  ROCKY MTN SPOTTED FVR AB, IGG-BLOOD  ROCKY MTN SPOTTED FVR AB, IGM-BLOOD   Dg Chest 1 View  05/06/2012  *RADIOLOGY REPORT*  Clinical Data: Syncope.  Dizziness.  CHEST - 1 VIEW  Comparison: None.  Findings: Heart size and pulmonary vascularity are normal and the lungs are clear.  There is a thoracolumbar scoliosis.  No acute osseous abnormality.  IMPRESSION: No acute disease.   Original Report Authenticated By: Gwynn Burly, M.D.     Date: 05/06/2012  Rate: 82  Rhythm: normal sinus rhythm  QRS Axis: normal  Intervals: normal  ST/T Wave abnormalities: normal  Conduction Disutrbances:none  Narrative Interpretation:   Old EKG Reviewed: unchanged   1. LOC (loss of consciousness)   2. Fever   3. Rash   4. Headache       MDM  dDx Vasovagal vs RMSF vs meningitis vs new onset seizure d/o  Chart review shows h/o of vasovagal with stress.  Neurological exam show no abnormalities.   Pt is not orthostatic.   Platelet are dropping today 139 vs 162 x6 days ago  LP yields clear fluid and initial analysis shows no abnormalities.   Will treat for RMSF and send blood tests.   New Prescriptions   DOXYCYCLINE (VIBRAMYCIN) 100 MG CAPSULE    Take 1 capsule (100  mg total) by mouth 2 (two) times daily. One po bid x 7 days          Wynetta Emery, PA-C 05/06/12 1623

## 2012-05-06 NOTE — ED Notes (Addendum)
Patient states she passed out one week ago at a concert with seizure-like activity.  Patient passed out again this morning in the shower and had jerking movements (per husband) and +LOC for 10-15 seconds.  Patient was confused for several minutes when she woke up.  Patient was running a fever of 101.8 last night.

## 2012-05-07 ENCOUNTER — Telehealth: Payer: Self-pay | Admitting: *Deleted

## 2012-05-07 LAB — ROCKY MTN SPOTTED FVR AB, IGM-BLOOD: RMSF IgM: 0.32 IV (ref 0.00–0.89)

## 2012-05-07 LAB — ROCKY MTN SPOTTED FVR AB, IGG-BLOOD: RMSF IgG: 0.22 IV

## 2012-05-07 NOTE — Telephone Encounter (Signed)
Patient advised per Dr. Linford Arnold message

## 2012-05-07 NOTE — Telephone Encounter (Signed)
Patient called to states almost positive she has [facility] Fever-titers drawn but will not be back for 2 weeks. Has spots, HA and Fever symptoms. Has these questions: 1. Can she take Tylenol her Liver enzymes are elevated 2. Can she take Flonase and allegra 3.  Want can she take for cough

## 2012-05-07 NOTE — ED Provider Notes (Signed)
Medical screening examination/treatment/procedure(s) were conducted as a shared visit with non-physician practitioner(s) and myself.  I personally evaluated the patient during the encounter Patient with a new onset headache for the last one week and a petechial rash over the upper and lower strength his. Slow decline in her platelet count from 200-160 to 1:30 today. Otherwise labs are within normal limits except for atypical lymphocytes on CBC. Patient's head CT is negative an LP today demonstrates no signs of meningitis. Patient was started on doxycycline for [facility] spotted fever. Findings were discussed with her PCP and patient will have close followup. No evidence of altered mental status and feel the syncope is most likely due to the stress of other issues going on in her body.  Gwyneth Sprout, MD 05/07/12 1539

## 2012-05-07 NOTE — Telephone Encounter (Signed)
SHe can use Aleve or Ibuprofen for for fever and HA.  Ok to take flonase and Careers adviser. Can use delsym OTC for cough.

## 2012-05-08 ENCOUNTER — Inpatient Hospital Stay (HOSPITAL_COMMUNITY)
Admission: EM | Admit: 2012-05-08 | Discharge: 2012-05-11 | DRG: 866 | Disposition: A | Discharge status: Home / Self Care | Payer: PRIVATE HEALTH INSURANCE | Attending: Family Medicine | Admitting: Family Medicine

## 2012-05-08 ENCOUNTER — Encounter (HOSPITAL_COMMUNITY): Payer: Self-pay

## 2012-05-08 DIAGNOSIS — B349 Viral infection, unspecified: Secondary | ICD-10-CM

## 2012-05-08 DIAGNOSIS — I341 Nonrheumatic mitral (valve) prolapse: Secondary | ICD-10-CM | POA: Diagnosis present

## 2012-05-08 DIAGNOSIS — B088 Other specified viral infections characterized by skin and mucous membrane lesions: Secondary | ICD-10-CM | POA: Diagnosis present

## 2012-05-08 DIAGNOSIS — I059 Rheumatic mitral valve disease, unspecified: Secondary | ICD-10-CM | POA: Diagnosis present

## 2012-05-08 DIAGNOSIS — B199 Unspecified viral hepatitis without hepatic coma: Secondary | ICD-10-CM | POA: Diagnosis present

## 2012-05-08 DIAGNOSIS — R509 Fever, unspecified: Secondary | ICD-10-CM

## 2012-05-08 DIAGNOSIS — R5381 Other malaise: Secondary | ICD-10-CM | POA: Diagnosis present

## 2012-05-08 DIAGNOSIS — B2790 Infectious mononucleosis, unspecified without complication: Principal | ICD-10-CM | POA: Diagnosis present

## 2012-05-08 DIAGNOSIS — R161 Splenomegaly, not elsewhere classified: Secondary | ICD-10-CM | POA: Diagnosis present

## 2012-05-08 DIAGNOSIS — B259 Cytomegaloviral disease, unspecified: Secondary | ICD-10-CM | POA: Diagnosis present

## 2012-05-08 DIAGNOSIS — R21 Rash and other nonspecific skin eruption: Secondary | ICD-10-CM

## 2012-05-08 DIAGNOSIS — R55 Syncope and collapse: Secondary | ICD-10-CM

## 2012-05-08 DIAGNOSIS — R5383 Other fatigue: Secondary | ICD-10-CM | POA: Diagnosis present

## 2012-05-08 DIAGNOSIS — D709 Neutropenia, unspecified: Secondary | ICD-10-CM | POA: Diagnosis present

## 2012-05-08 DIAGNOSIS — D72820 Lymphocytosis (symptomatic): Secondary | ICD-10-CM | POA: Diagnosis present

## 2012-05-08 LAB — CBC
Hemoglobin: 12.5 g/dL (ref 12.0–15.0)
MCV: 89.2 fL (ref 78.0–100.0)
Platelets: 139 10*3/uL — ABNORMAL LOW (ref 150–400)
RBC: 4.16 MIL/uL (ref 3.87–5.11)
WBC: 5.3 10*3/uL (ref 4.0–10.5)

## 2012-05-08 LAB — BASIC METABOLIC PANEL
CO2: 29 mEq/L (ref 19–32)
Calcium: 8.8 mg/dL (ref 8.4–10.5)
GFR calc non Af Amer: >90 mL/min (ref >90)
Potassium: 3.3 mEq/L — ABNORMAL LOW (ref 3.5–5.1)
Sodium: 139 mEq/L (ref 135–145)

## 2012-05-08 MED ORDER — HEPARIN SODIUM (PORCINE) 5000 UNIT/ML IJ SOLN
5000.0000 [IU] | Freq: Three times a day (TID) | INTRAMUSCULAR | Status: DC
  Administered 2012-05-08 – 2012-05-11 (×9): 5000 [IU] via SUBCUTANEOUS
  Filled 2012-05-08 (×12): qty 1

## 2012-05-08 MED ORDER — POTASSIUM CHLORIDE CRYS ER 20 MEQ PO TBCR
40.0000 meq | EXTENDED_RELEASE_TABLET | Freq: Once | ORAL | Status: AC
  Administered 2012-05-08: 40 meq via ORAL
  Filled 2012-05-08: qty 2

## 2012-05-08 MED ORDER — METOCLOPRAMIDE HCL 5 MG/ML IJ SOLN
10.0000 mg | Freq: Once | INTRAMUSCULAR | Status: AC
  Administered 2012-05-08: 10 mg via INTRAVENOUS
  Filled 2012-05-08: qty 2

## 2012-05-08 MED ORDER — SODIUM CHLORIDE 0.9 % IV SOLN
INTRAVENOUS | Status: DC
  Administered 2012-05-08: 15:00:00 via INTRAVENOUS

## 2012-05-08 MED ORDER — SODIUM CHLORIDE 0.9 % IV BOLUS (SEPSIS)
1000.0000 mL | Freq: Once | INTRAVENOUS | Status: AC
  Administered 2012-05-08: 1000 mL via INTRAVENOUS

## 2012-05-08 MED ORDER — FLUTICASONE PROPIONATE 50 MCG/ACT NA SUSP
2.0000 | Freq: Every day | NASAL | Status: DC
  Administered 2012-05-08 – 2012-05-09 (×2): 2 via NASAL
  Filled 2012-05-08: qty 16

## 2012-05-08 NOTE — ED Notes (Addendum)
Pt in bed states feeling a little better. Headache gone.

## 2012-05-08 NOTE — ED Notes (Signed)
Report given to [facility] on 4 E.

## 2012-05-08 NOTE — ED Provider Notes (Signed)
History     CSN: [id number]  Arrival date & time 05/08/12  7829   First MD Initiated Contact with Patient 05/08/12 [phone]      Chief Complaint  Patient presents with  . Migraine    (Consider location/radiation/quality/duration/timing/severity/associated sxs/prior treatment) HPI Patient complains of headache for the past week. Headache is occipital patient has had multiple episodes of vomiting today. Maximum temperature was probably 1013 days ago per her husband. Patient has had 2 syncopal events in the past week. She continues to have dry heaves and nausea symptoms accompanied by mild photophobia no other complaint. With doxycycline empirically for possible RMSF no other complaint no other associated symptoms. RMSF titers have come back negative. Patient also had CT scan of her brain and lumbar puncture results of which are negative. Culture negative to date Past Medical History  Diagnosis Date  . Endometriosis   . MVP (mitral valve prolapse)   . Environmental allergies     Past Surgical History  Procedure Date  . Transurethral resection of bladder   . Cesarean section     X2  . Ureteral reimplantion     LEFT  . Pelvic laparoscopy 10/2005    ENDOMETRIOSIS  . Cystoscopy   . Oophorectomy 04/2002    LSO    Family History  Problem Relation Age of Onset  . Hypertension Mother   . Diabetes Father   . Cancer Father     lung, leukemia  . Cancer Maternal Grandfather     COLON    History  Substance Use Topics  . Smoking status: Never Smoker   . Smokeless tobacco: Not on file  . Alcohol Use: No    OB History    Grav Para Term Preterm Abortions TAB SAB Ect Mult Living   2 2   0     2      Review of Systems  Constitutional: Negative.   Eyes: Positive for photophobia.  Respiratory: Positive for cough.        Mild cough for the past 2 weeks, nonproductive typical of "allergies  Cardiovascular: Negative.   Gastrointestinal: Positive for nausea and vomiting.    Genitourinary:       Amenorrheic for 6 years and since her ablation for endometriosis  Musculoskeletal: Negative.   Skin: Negative.   Neurological: Positive for headaches.  Hematological: Negative.   Psychiatric/Behavioral: Negative.   All other systems reviewed and are negative.    Allergies  Bee venom and Propoxyphene-acetaminophen  Home Medications   Current Outpatient Rx  Name Route Sig Dispense Refill  . ACETAMINOPHEN 500 MG PO TABS Oral Take 1,000 mg by mouth once.    Marland Kitchen CETIRIZINE HCL 5 MG PO TABS Oral Take 5 mg by mouth daily.    Marland Kitchen DOXYCYCLINE HYCLATE 100 MG PO CAPS Oral Take 100 mg by mouth 2 (two) times daily. One po bid x 7 days. PT'S ON DAY 3 OF THERAPY    . FLUTICASONE PROPIONATE 50 MCG/ACT NA SUSP Nasal Place 2 sprays into the nose daily.      BP 117/55  Pulse 73  Temp 98 F (36.7 C) (Oral)  Resp 16  SpO2 98%  Physical Exam  Nursing note and vitals reviewed. Constitutional: She is oriented to person, place, and time. She appears well-developed and well-nourished. She appears distressed.       Mildly uncomfortable Glasgow Coma Score 15  HENT:  Head: Normocephalic and atraumatic.  Eyes: Conjunctivae normal are normal. Pupils are equal, round, and reactive  to light.       Fundi benign  Neck: Neck supple. No tracheal deviation present. No thyromegaly present.  Cardiovascular: Normal rate and regular rhythm.   No murmur heard. Pulmonary/Chest: Effort normal and breath sounds normal.  Abdominal: Soft. Bowel sounds are normal. She exhibits no distension. There is no tenderness.  Musculoskeletal: Normal range of motion. She exhibits no edema and no tenderness.  Neurological: She is alert and oriented to person, place, and time. She exhibits normal muscle tone. Coordination normal.  Skin: Skin is warm and dry. No rash noted.  Psychiatric: She has a normal mood and affect.    ED Course  Procedures (including critical care time)  Labs Reviewed - No data to  display No results found.  Date: 05/08/2012  Rate: 70  Rhythm: normal sinus rhythm  QRS Axis: normal  Intervals: normal  ST/T Wave abnormalities: nonspecific T wave changes  Conduction Disutrbances:none  Narrative Interpretation:   Old EKG Reviewed: No change from 04/16/1999 interpreted by me   No diagnosis found. Results for orders placed during the hospital encounter of 05/08/12  BASIC METABOLIC PANEL      Component Value Range   Sodium 139  135 - 145 mEq/L   Potassium 3.3 (*) 3.5 - 5.1 mEq/L   Chloride 103  96 - 112 mEq/L   CO2 29  19 - 32 mEq/L   Glucose, Bld 171 (*) 70 - 99 mg/dL   BUN 17  6 - 23 mg/dL   Creatinine, Ser 1.61  0.50 - 1.10 mg/dL   Calcium 8.8  8.4 - 09.6 mg/dL   GFR calc non Af Amer >90  >90 mL/min   GFR calc Af Amer >90  >90 mL/min  CBC      Component Value Range   WBC 5.3  4.0 - 10.5 K/uL   RBC 4.16  3.87 - 5.11 MIL/uL   Hemoglobin 12.5  12.0 - 15.0 g/dL   HCT 04.5  40.9 - 81.1 %   MCV 89.2  78.0 - 100.0 fL   MCH 30.0  26.0 - 34.0 pg   MCHC 33.7  30.0 - 36.0 g/dL   RDW 91.4  78.2 - 95.6 %   Platelets 139 (*) 150 - 400 K/uL   Dg Chest 1 View  05/06/2012  *RADIOLOGY REPORT*  Clinical Data: Syncope.  Dizziness.  CHEST - 1 VIEW  Comparison: None.  Findings: Heart size and pulmonary vascularity are normal and the lungs are clear.  There is a thoracolumbar scoliosis.  No acute osseous abnormality.  IMPRESSION: No acute disease.   Original Report Authenticated By: Gwynn Burly, M.D.    Ct Head Wo Contrast  05/06/2012  *RADIOLOGY REPORT*  Clinical Data: Loss of consciousness  CT HEAD WITHOUT CONTRAST  Technique:  Contiguous axial images were obtained from the base of the skull through the vertex without contrast.  Comparison: 03/06/2008  Findings: There is no evidence for acute hemorrhage, hydrocephalus, mass lesion, or abnormal extra-axial fluid collection.  No definite CT evidence for acute infarction.  The visualized paranasal sinuses and mastoid air  cells are predominately clear.  The  IMPRESSION: No acute intracranial abnormality.   Original Report Authenticated By: Waneta Martins, M.D.     12:40 PM patient is alert [place] to come score 15 there is improvement after treatment with intravenous Reglan  MDM   Spoke with Dr. Burnett Harry plan telemetry, observation, infectious disease consultation. Echocardiogram ordered Diagnoses #1 headache #2 recurrent syncope 3 hypokalemia 4 hyperglycemia  Doug Sou, MD 05/08/12 1256

## 2012-05-08 NOTE — Consult Note (Signed)
INFECTIOUS DISEASE CONSULT NOTE  Date of Admission:  05/08/2012  Date of Consult:  05/08/2012  Reason for Consult: Fever and Rash Referring Physician: Dr Sharin Mons  Impression/Recommendation Fever and Rash  Check - ANA, ANCA, EBV, CMV, TSH, HIV, Ehrlichia Ab, state arbovirus virus panel.   Comment- she does not have CSF consistent with bacterial process. She could have ehrlichiosis but would expect this to be improving after doxy (although only 2 days). She could have "mono" like syndrome although she is a bit old to get primary CMV or EBV or toxo. Lastly, could consider auto-immune condition behind her rash and fever. Would seem most likely she has "summer virus" such as echo, coxsackie. It would be very odd for it to last this long though (>2 weeks). Continue to watch off anbx unless sh clinically changes.  Thank you so much for this interesting consult,   Johny Sax [phone]  Jessica Mullen is an 51 y.o. female.  HPI: 51 yo F with PMHx of Mitral Valve Prolapse comes to [facility] with feelings of fatigue, headache and LE rash (UE and LE) beginning on Sept 4.  By 9-11 she had had a 30 second syncopal episode while at a concert. She was seen by her PMD and felt to be dehydrated.  By 9-19 she came to ED with syncope and 15 minutes of confusion afterwards. She had an un-wittnessed fall and was "out" for 15-20 seconds. She was noted to have a temp 101.8 as well as headache. She was started on doxy for RMSF (serologies ultimately -). She had a head CT (-) for acute change. CXR also no acute findings.  She had LP showing Glc 64, Prot 18, 1 WBC, 1 RBC. Cx ngtd.  She returned 9-20 with worsening headache, sweats and fever. She was able to sleep overnight. She awoke in the AM, ate some crackers and then developed n/v.  Her headache is now resolved, as is her nausea (s/p reglan).   Past Medical History  Diagnosis Date  . Endometriosis   . MVP (mitral valve prolapse)   . Environmental allergies       Past Surgical History  Procedure Date  . Transurethral resection of bladder   . Cesarean section     X2  . Ureteral reimplantion     LEFT  . Pelvic laparoscopy 10/2005    ENDOMETRIOSIS  . Cystoscopy   . Oophorectomy 04/2002    LSO     Allergies  Allergen Reactions  . Bee Venom     "BEE STINGS"  . Propoxyphene-Acetaminophen     REACTION: Suppreses Respirations    Medications:  Scheduled:   . fluticasone  2 spray Each Nare Daily  . heparin  5,000 Units Subcutaneous Q8H  . metoCLOPramide (REGLAN) injection  10 mg Intravenous Once  . potassium chloride  40 mEq Oral Once  . sodium chloride  1,000 mL Intravenous Once    Social History:  reports that she has never smoked. She does not have any smokeless tobacco history on file. She reports that she does not drink alcohol or use illicit drugs.  Family History  Problem Relation Age of Onset  . Hypertension Mother   . Diabetes Father   . Cancer Father     lung, leukemia  . Cancer Maternal Grandfather     COLON    General ROS: photophobia previously (resolved several days ago), normal urination, no BM since admission (previously normal), rash is mostly resolved. She does out Art gallery manager, has 1  dog, denies hx of tick bites, has had mosquito bites. No LAN. occas myalgias with fever.   Blood pressure 108/67, pulse 80, temperature 97.8 F (36.6 C), temperature source Oral, resp. rate 18, height 5\' 3"  (1.6 m), weight 65.137 kg (143 lb 9.6 oz), SpO2 100.00%. General appearance: alert, cooperative and no distress Eyes: negative findings: conjunctivae and sclerae normal and pupils equal, round, reactive to light and accomodation Throat: normal findings: soft palate, uvula, and tonsils normal and oropharynx pink & moist without lesions or evidence of thrush Neck: no adenopathy and FROM Heart: regular rate and rhythm Abdomen: normal findings: bowel sounds normal and soft, non-tender Extremities: no edema. no elbow,  wrist, knee swelling or tenderness.  Skin: petechiae - scattered on legs   Results for orders placed during the hospital encounter of 05/08/12 (from the past 48 hour(s))  BASIC METABOLIC PANEL     Status: Abnormal   Collection Time   05/08/12 10:01 AM      Component Value Range Comment   Sodium 139  135 - 145 mEq/L    Potassium 3.3 (*) 3.5 - 5.1 mEq/L    Chloride 103  96 - 112 mEq/L    CO2 29  19 - 32 mEq/L    Glucose, Bld 171 (*) 70 - 99 mg/dL    BUN 17  6 - 23 mg/dL    Creatinine, Ser 2.95  0.50 - 1.10 mg/dL    Calcium 8.8  8.4 - 62.1 mg/dL    GFR calc non Af Amer >90  >90 mL/min    GFR calc Af Amer >90  >90 mL/min   CBC     Status: Abnormal   Collection Time   05/08/12 10:01 AM      Component Value Range Comment   WBC 5.3  4.0 - 10.5 K/uL    RBC 4.16  3.87 - 5.11 MIL/uL    Hemoglobin 12.5  12.0 - 15.0 g/dL    HCT 30.8  65.7 - 84.6 %    MCV 89.2  78.0 - 100.0 fL    MCH 30.0  26.0 - 34.0 pg    MCHC 33.7  30.0 - 36.0 g/dL    RDW 96.2  95.2 - 84.1 %    Platelets 139 (*) 150 - 400 K/uL       Component Value Date/Time   SDES CSF 05/06/2012 1225   SDES CSF 05/06/2012 1225   SPECREQUEST NONE 05/06/2012 1225   SPECREQUEST NONE 05/06/2012 1225   CULT NO GROWTH 05/06/2012 1225   REPTSTATUS PENDING 05/06/2012 1225   REPTSTATUS 05/06/2012 FINAL 05/06/2012 1225   No results found. Recent Results (from the past 240 hour(s))  URINE CULTURE     Status: Normal   Collection Time   04/30/12  4:42 PM      Component Value Range Status Comment   Colony Count NO GROWTH   Final    Organism ID, Bacteria NO GROWTH   Final   CSF CULTURE     Status: Normal (Preliminary result)   Collection Time   05/06/12 12:25 PM      Component Value Range Status Comment   Specimen Description CSF   Final    Special Requests NONE   Final    Gram Stain     Final    Value: NO WBC SEEN     NO ORGANISMS SEEN   Culture NO GROWTH   Final    Report Status PENDING   Incomplete   GRAM STAIN  Status: Normal    Collection Time   05/06/12 12:25 PM      Component Value Range Status Comment   Specimen Description CSF   Final    Special Requests NONE   Final    Gram Stain     Final    Value: WBC SEEN     NO ORGANISMS SEEN     Gram Stain Report Called to,Read Back By and Verified With:     Margarita Rana RN AT 1325 ON [phone] BY SHUEA   Report Status 05/06/2012 FINAL   Final       05/08/2012, 3:54 PM     LOS: 0 days

## 2012-05-08 NOTE — Progress Notes (Signed)
[facility] History and Physical  Jessica Mullen WUJ:[id number] DOB: [birth year] DOA: 05/08/2012  Referring physician: Ethelda Chick, MD PCP: Nani Gasser, MD   Chief Complaint: Syncopal episodes plus fever over the past from over the past 2-3 days  HPI: Jessica Mullen is a 51 y.o. female  Patient stated she started to feel run down, tired was shivering and was feeling overall ill on Sept 4th, had a terrible Headache and noted a rash on the lower legs and arms-had some URI symptoms at the time and was taking some medciations, caffeine which seeemd to alleviate the HA's as well as Excedrin migraine.  Her husband and her went to a concert on 9/11 and half was through the second set of the concert she felt "hot and dizzy and lightheaded " and then passed out. She was given water to drink and felt somewhat "drugged" after this (although doesnot use any drugs)-she was "out" for about 30 seconds and didn't come to and was talking to her husband for 3-4 min.  No tongue biting, no rolling of eyes back into head She went to Dr. Eppie Gibson on 9/14 and was thought to have vol depletion, some allergy meds were changed and it was thgouht at the time this might be a Sinusitus  Then 2 days ago on 9/19 and presented to the Ed 2/2 to pre-syncopy and mild confusion. Her Husband heard a "crash" in the RR and pt was "out for 15 -20 second which seemed to be post-ictal and has noted some "shakiness: and "rigor" Did feel a little nauseous before this symptoms-a fever of 101.8 was noted by husband per the EDP report and she had significant headaches which prompted a work-up with aDDx of Vasovagal syncopy vs RMSF (wth rash h/o) vs New onset seizure disorder  Currently on D#3 of doxicyclin for empiric RMSF coverage  Last night 9/20 had a bad HA, started at about 10:00 pm or 11-she awaoke at about 07:00 am and had some cold swweats and felt feverish and wen tto RR and lay on the floor and was"drenched throguth"  and tried to take something for he rHEA and got nauseated and Husband cam home to find her vomiting about 6-7 times this am  Review of Systems:  Occasional involuntary shakes of L arm NO Ha-right now-had one about 7/10 HA, no blurred or double vision, some stuffiness of nose right now. NO current fever-Regular thermometer is digital +fullness of ears, no sore throat, No Cp right now, No Gerd NO diarrhea, NO dysuria, NO vag bleeding, no discharge BNO dark or tarry stool Still has slight rash   Past Medical History  Diagnosis Date  . Endometriosis   . MVP (mitral valve prolapse)   . Environmental allergies    Chart review  Left Pelvic mass s/p L Salpingo-oophorectomy 2003  S/p transurethral polpy resection and re-implantation L Ureter  S/p Hysteroscopy 10/31/2005 with endometrial currettage and biospy  Seen by Dr. Antoine Poche 07/05/2007 with Chest discomfort-noted on that HPI to have syncopal episodes related to GI illness in March of 2008  Rx Bug bite doxycycline 01/01/2010-culture + then for staph, thoguht to potentially be HSV simplex??  Past Surgical History  Procedure Date  . Transurethral resection of bladder   . Cesarean section     X2  . Ureteral reimplantion     LEFT  . Pelvic laparoscopy 10/2005    ENDOMETRIOSIS  . Cystoscopy   . Oophorectomy 04/2002    LSO   Social History:  reports that  she has never smoked. She does not have any smokeless tobacco history on file. She reports that she does not drink alcohol or use illicit drugs.  History   Social History  . Marital Status: Married    Spouse Name: N/A    Number of Children: N/A  . Years of Education: N/A   Occupational History  . Not on file.   Social History Main Topics  . Smoking status: Never Smoker   . Smokeless tobacco: Not on file  . Alcohol Use: No  . Drug Use: No  . Sexually Active: Yes   Other Topics Concern  . Not on file   Social History Narrative  . No narrative on file   History    Social History Narrative  . No narrative on file     Allergies  Allergen Reactions  . Bee Venom     "BEE STINGS"  . Propoxyphene-Acetaminophen     REACTION: Suppreses Respirations    Family History  Problem Relation Age of Onset  . Hypertension Mother   . Diabetes Father   . Cancer Father     lung, leukemia  . Cancer Maternal Grandfather     COLON    Prior to Admission medications   Medication Sig Start Date End Date Taking? Authorizing Provider  acetaminophen (TYLENOL) 500 MG tablet Take 1,000 mg by mouth once.   Yes Historical Provider, MD  cetirizine (ZYRTEC) 5 MG tablet Take 5 mg by mouth daily.   Yes Historical Provider, MD  doxycycline (VIBRAMYCIN) 100 MG capsule Take 100 mg by mouth 2 (two) times daily. One po bid x 7 days. PT'S ON DAY 3 OF THERAPY 05/06/12  Yes Nicole Pisciotta, PA-C  fluticasone (FLONASE) 50 MCG/ACT nasal spray Place 2 sprays into the nose daily. 04/30/12 04/30/13 Yes Jomarie Longs, PA-C   Physical Exam: Filed Vitals:   05/08/12 0943 05/08/12 1044 05/08/12 1123 05/08/12 1211  BP: 117/55 94/55  109/62  Pulse: 73 69 72 78  Temp: 98 F (36.7 C)   98.4 F (36.9 C)  TempSrc: Oral   Oral  Resp: 16 16 20 18   SpO2: 98% 99% 98% 98%     General:  Alert oriented pleasant Caucasian female feeling much better than earlier this morning  Eyes: Fundi examined and sharp bilaterally no silver wiring. No evidence of papular edema. Vision by direct confrontation normal  ENT: Throat clear with moderate dentition uvula midline smile symmetric wrinkles for equally bilaterally  Neck: Soft, supple, no JVD, no bruit  Cardiovascular: S1-S2 regular rate rhythm  Respiratory: Clinically clear no added sound  Abdomen: Soft nontender nondistended no rebound no guarding  Skin: Fine papular rash over her lower extremities and found nowhere else  Musculoskeletal: No swelling of joints  Psychiatric: Euthymic  Neurologic: Power 5 over 5 bilaterally in shoulders  biceps triceps hips knees and hamstrings. Reflexes 2/3. Coordination intact. Gait not assessed. Babinski's downgoing pulses palpable bilaterally lower extremities  Labs on Admission:  Basic Metabolic Panel:  Lab 05/08/12 1610 05/06/12 0850  NA 139 135  K 3.3* 3.9  CL 103 99  CO2 29 28  GLUCOSE 171* 124*  BUN 17 14  CREATININE 0.77 0.83  CALCIUM 8.8 8.7  MG -- --  PHOS -- --   Liver Function Tests: No results found for this basename: AST:5,ALT:5,ALKPHOS:5,BILITOT:5,PROT:5,ALBUMIN:5 in the last 168 hours No results found for this basename: LIPASE:5,AMYLASE:5 in the last 168 hours No results found for this basename: AMMONIA:5 in the last 168  hours CBC:  Lab 05/08/12 1001 05/06/12 0850  WBC 5.3 8.1  NEUTROABS -- 3.4  HGB 12.5 12.6  HCT 37.1 36.8  MCV 89.2 87.8  PLT 139* 139*   Cardiac Enzymes: No results found for this basename: CKTOTAL:5,CKMB:5,CKMBINDEX:5,TROPONINI:5 in the last 168 hours  BNP (last 3 results) No results found for this basename: PROBNP:3 in the last 8760 hours CBG: No results found for this basename: GLUCAP:5 in the last 168 hours  Radiological Exams on Admission: No results found.  EKG: Independently reviewed. Normal sinus rhythm potential slight right atrial enlargement however and no criteria for LVH ST-T wave segments in normal  Assessment/Plan Principal Problem:  *Syncope and collapse Active Problems:  FATIGUE  SKIN RASH  MVP (mitral valve prolapse)  Viral illness  Mitral valve prolapse syndrome   1. Syncope and collapse-differential diagnosis at this stage includes potential new-onset seizure [rare at this age] versus vasovagal incompetence and falls secondary to orthostasis. Please see below-she has had CT scan of head recently I will not order any other imaging at this stage 2. Mitral valve prolapse-EKG reviewed this morning and seemed normal. I will get an echocardiogram to delineate valvular pathology. Patient claims to me that she  drinks about 100 ounces of water a day and has only been able to keep up the 50 or 60 ounce water in spite of doing her regular activities since she's been 69/11/2011. I am inclined to think that this patient is pre-load dependent and if she does not keep up with her fluids in the setting of any viral illness or stress she potentially will become hypotensive-she was given a bolus IV fluids in the emergency room and we will continue IV fluid at 100 cc per hour and get orthostatic blood pressures At this stage I will hold off on further neuroimaging in terms of MRI brain and carotid arteries given she has a low pretest probability of significant atherosclerosis and her symptoms do not fit with either embolic or thromboembolic phenomena to the brain 3. ? [place] spotted fever-I've consulted Dr. Ninetta Lights to help me determine if there is any other further workup that we need to pursue. She had a urine analysis done in the emergency room 9/19 which showed many bacteria but is asymptomatic and does not have any burning. I would hold IV antibiotics at this time. Her doxycycline will be held given she had nausea and vomiting this morning and her RMSF titers are negative 4. Likely viral exanthem-DDx [facility] spotted fever versus [place] or Western equine versus HSV meningitis-this dates back 17 days 2 04/21/2012 and do not think there is a role for further workup at this stage. She has had a lumbar puncture for this which are shown no organisms-her differential count confirms mild neutropenai and lymphocytosis and we'll repeat his CBC with differential count 9/22 am-also get LFTs and INR in the morning  Code Status: Full Family Communication: Discussed in detail at bedside with the husband and the patient the course of care and my likely thought process that this maybe related to mitral valve prolapse Disposition Plan: Observation on telemetry, [facility] 3  Time spent: 75 minutes  Mahala Menghini  [facility] Pager [phone]  If 7PM-7AM, please contact night-coverage [email or web address] Password [facility] 05/08/2012, 12:47 PM

## 2012-05-08 NOTE — ED Notes (Signed)
MD at bedside. 

## 2012-05-08 NOTE — ED Notes (Signed)
Pt husband states pt has near syncopal episodes and migraines since 1 week. Pt has vomited multiple times this Am,

## 2012-05-09 ENCOUNTER — Observation Stay (HOSPITAL_COMMUNITY): Payer: PRIVATE HEALTH INSURANCE

## 2012-05-09 DIAGNOSIS — R55 Syncope and collapse: Secondary | ICD-10-CM

## 2012-05-09 HISTORY — PX: TRANSTHORACIC ECHOCARDIOGRAM: SHX275

## 2012-05-09 LAB — COMPREHENSIVE METABOLIC PANEL
Albumin: 2.4 g/dL — ABNORMAL LOW (ref 3.5–5.2)
Alkaline Phosphatase: 59 U/L (ref 39–117)
BUN: 16 mg/dL (ref 6–23)
Calcium: 8.3 mg/dL — ABNORMAL LOW (ref 8.4–10.5)
GFR calc Af Amer: >90 mL/min (ref >90)
Potassium: 3.9 mEq/L (ref 3.5–5.1)
Total Protein: 5.7 g/dL — ABNORMAL LOW (ref 6.0–8.3)

## 2012-05-09 LAB — HEPATITIS PANEL, ACUTE
HCV Ab: NEGATIVE
Hep A IgM: NEGATIVE
Hep B C IgM: NEGATIVE
Hepatitis B Surface Ag: NEGATIVE

## 2012-05-09 LAB — CBC
HCT: 31.3 % — ABNORMAL LOW (ref 36.0–46.0)
Hemoglobin: 10.5 g/dL — ABNORMAL LOW (ref 12.0–15.0)
MCV: 89.2 fL (ref 78.0–100.0)
RBC: 3.51 MIL/uL — ABNORMAL LOW (ref 3.87–5.11)
RDW: 13.8 % (ref 11.5–15.5)
WBC: 7.3 10*3/uL (ref 4.0–10.5)

## 2012-05-09 LAB — HIV ANTIBODY (ROUTINE TESTING W REFLEX): HIV: NONREACTIVE

## 2012-05-09 LAB — PROTIME-INR: INR: 1.03 (ref 0.00–1.49)

## 2012-05-09 MED ORDER — IBUPROFEN 600 MG PO TABS
600.0000 mg | ORAL_TABLET | Freq: Once | ORAL | Status: AC
  Administered 2012-05-09: 600 mg via ORAL
  Filled 2012-05-09: qty 1

## 2012-05-09 MED ORDER — ACETAMINOPHEN 325 MG PO TABS
650.0000 mg | ORAL_TABLET | Freq: Four times a day (QID) | ORAL | Status: DC | PRN
  Administered 2012-05-09: 650 mg via ORAL

## 2012-05-09 MED ORDER — ACETAMINOPHEN 325 MG PO TABS
ORAL_TABLET | ORAL | Status: AC
  Filled 2012-05-09: qty 2

## 2012-05-09 NOTE — Progress Notes (Signed)
  Echocardiogram 2D Echocardiogram has been performed.  Jessica Mullen 05/09/2012, 8:58 AM

## 2012-05-09 NOTE — Progress Notes (Signed)
INFECTIOUS DISEASE PROGRESS NOTE  ID: Jessica Mullen is a 51 y.o. female with   Principal Problem:  *Syncope and collapse Active Problems:  FATIGUE  SKIN RASH  MVP (mitral valve prolapse)  Viral illness  Mitral valve prolapse syndrome  Subjective: Without complaints  Abtx:  Anti-infectives    None      Medications:  Scheduled:   . acetaminophen      . fluticasone  2 spray Each Nare Daily  . heparin  5,000 Units Subcutaneous Q8H    Objective: Vital signs in last 24 hours: Temp:  [97.3 F (36.3 C)-98.9 F (37.2 C)] 98.4 F (36.9 C) (09/22 1321) Pulse Rate:  [72-89] 84  (09/22 1321) Resp:  [16-20] 16  (09/22 1321) BP: (90-131)/(49-88) 122/52 mmHg (09/22 1321) SpO2:  [97 %-100 %] 99 % (09/22 1321)   General appearance: alert, cooperative and no distress Skin: fine petechial rash on legs, no change  Lab Results  Basename 05/09/12 0520 05/08/12 1001  WBC 7.3 5.3  HGB 10.5* 12.5  HCT 31.3* 37.1  NA 138 139  K 3.9 3.3*  CL 107 103  CO2 25 29  BUN 16 17  CREATININE 0.65 0.77  GLU -- --   Liver Panel  Basename 05/09/12 0520  PROT 5.7*  ALBUMIN 2.4*  AST 185*  ALT 191*  ALKPHOS 59  BILITOT 0.3  BILIDIR --  IBILI --   Sedimentation Rate No results found for this basename: ESRSEDRATE in the last 72 hours C-Reactive Protein No results found for this basename: CRP:2 in the last 72 hours  Microbiology: Recent Results (from the past 240 hour(s))  URINE CULTURE     Status: Normal   Collection Time   04/30/12  4:42 PM      Component Value Range Status Comment   Colony Count NO GROWTH   Final    Organism ID, Bacteria NO GROWTH   Final   CSF CULTURE     Status: Normal (Preliminary result)   Collection Time   05/06/12 12:25 PM      Component Value Range Status Comment   Specimen Description CSF   Final    Special Requests NONE   Final    Gram Stain     Final    Value: NO WBC SEEN     NO ORGANISMS SEEN   Culture NO GROWTH 2 DAYS   Final    Report Status PENDING   Incomplete   GRAM STAIN     Status: Normal   Collection Time   05/06/12 12:25 PM      Component Value Range Status Comment   Specimen Description CSF   Final    Special Requests NONE   Final    Gram Stain     Final    Value: WBC SEEN     NO ORGANISMS SEEN     Gram Stain Report Called to,Read Back By and Verified With:     Margarita Rana RN AT 1325 ON [id number] BY SHUEA   Report Status 05/06/2012 FINAL   Final     Studies/Results: US Abdomen Complete  05/09/2012  *RADIOLOGY REPORT*  Clinical Data:  Elevated liver function tests  COMPLETE ABDOMINAL ULTRASOUND  Comparison:  Abdomen and pelvis CT, 12/24/2010  Findings:  Gallbladder:  Gallbladder is moderately distended.  There are three small polyps, but no stones or wall thickening or evidence of acute cholecystitis.  Common bile duct:  Normal in caliber measuring 5 mm  Liver:  No  focal lesion identified.  Within normal limits in parenchymal echogenicity.  IVC:  Appears normal.  Pancreas:  Normal in size and echogenicity with no masses  Spleen:  Spleen is mildly enlarged measuring 14.1 cm x 14.9 cm x 5 cm.  This yields a volume of 552 ml.  No splenic mass or focal lesion.  Right Kidney:  Normal measuring 12.1 cm in length  Left Kidney:  Normal measuring 10 cm in length  Abdominal aorta:  Normal in caliber  IMPRESSION: Normal gallbladder.  No bile duct dilation.  Normal liver.  Mild splenomegaly of unclear etiology.  Exam otherwise unremarkable.   Original Report Authenticated By: Domenic Moras, M.D.      Assessment/Plan: Fever and Rash Fever appears to have resolved.  Now relates hx of having been in extreme race and being in "dirty" fresh water.  Total days of antibiotics 0 Will continue to watch her off anbx Multiple serologies pending: ANA, ANCA, EBV, CMV, TSH, Ehrlichia Ab, state arbovirus virus panel.  HIV (-) Her new hx raises ? Of leptospirosis which has been seen with extreme races.  Will check serologies.    Johny Sax Infect[facility] Diseases [phone] 05/09/2012, 3:56 PM   LOS: 1 day

## 2012-05-09 NOTE — Progress Notes (Signed)
PROGRESS NOTE  Jessica Mullen WGN:[id number] DOB: 31-Jan-1961 DOA: 05/08/2012 PCP: Nani Gasser, MD  Brief narrative: 51 yr old female admitted after extended convalsecence from ? Viral illness.  Bacterial meninigitis ruled out on 9/19 with LP, but as she continued to have further HA's, sweats and malaise , re-presented to [facility] ED 9/22 and was ultimately admitted to further work-up  Past medical history-As per Problem list Chart review  Left Pelvic mass s/p L Salpingo-oophorectomy 2003  S/p transurethral polpy resection and re-implantation L Ureter  S/p Hysteroscopy 10/31/2005 with endometrial currettage and biospy  Seen by Dr. Antoine Poche 07/05/2007 with Chest discomfort-noted on that HPI to have syncopal episodes related to GI illness in March of 2008  Rx Bug bite doxycycline 01/01/2010-culture + then for staph, thoguht to potentially be HSV simplex??  Consultants:  ID-Dr. Ninetta Lights (consult appreciated-thank you)  Procedures:  CXR 1 view 9/19=neg  CT head 9/19=neg  Echo 9/22=pending    Antibiotics/ID  LAbs  Ehrlichia P  EBV P  CMV P  Western Nile P  Arbovirus CSF-Needs to be added  ANA/ANCA P   Subjective  Feels much better.  No n/v/cp. Tolerated full diet.  Passed a restful night Echo just completed of heart Husband @ bedside   Objective    Interim History: NAd  Telemetry: Sinus-Tele d/c 9/22 am  Objective: Filed Vitals:   05/08/12 2300 05/09/12 0700 05/09/12 0710 05/09/12 0715  BP: 101/61 90/49 103/61 107/61  Pulse: 89 75 81 78  Temp: 98.9 F (37.2 C) 98.4 F (36.9 C)    TempSrc: Oral Oral    Resp: 20 20    Height:      Weight:      SpO2: 97% 98%      Intake/Output Summary (Last 24 hours) at 05/09/12 0906 Last data filed at 05/09/12 0700  Gross per 24 hour  Intake    480 ml  Output   1100 ml  Net   -620 ml    Exam:  General: Alert oriented pleasant Caucasian female feeling much better than earlier this morning  Eyes: nothing  changed ENT: Throat clear  Neck: Soft, supple, no JVD, no bruit  Cardiovascular: S1-S2 regular rate rhythm, grade 2-3 ESM LUSE Respiratory: Clinically clear no added sound  Abdomen: tender c deep palpation RUQ  Data Reviewed: Basic Metabolic Panel:  Lab 05/09/12 7846 05/08/12 1001 05/06/12 0850  NA 138 139 135  K 3.9 3.3* --  CL 107 103 99  CO2 25 29 28   GLUCOSE 113* 171* 124*  BUN 16 17 14   CREATININE 0.65 0.77 0.83  CALCIUM 8.3* 8.8 8.7  MG -- -- --  PHOS -- -- --   Liver Function Tests:  Lab 05/09/12 0520  AST 185*  ALT 191*  ALKPHOS 59  BILITOT 0.3  PROT 5.7*  ALBUMIN 2.4*   No results found for this basename: LIPASE:5,AMYLASE:5 in the last 168 hours No results found for this basename: AMMONIA:5 in the last 168 hours CBC:  Lab 05/09/12 0520 05/08/12 1001 05/06/12 0850  WBC 7.3 5.3 8.1  NEUTROABS -- -- 3.4  HGB 10.5* 12.5 12.6  HCT 31.3* 37.1 36.8  MCV 89.2 89.2 87.8  PLT 140* 139* 139*   Cardiac Enzymes: No results found for this basename: CKTOTAL:5,CKMB:5,CKMBINDEX:5,TROPONINI:5 in the last 168 hours BNP: No components found with this basename: POCBNP:5 CBG: No results found for this basename: GLUCAP:5 in the last 168 hours  Recent Results (from the past 240 hour(s))  URINE CULTURE  Status: Normal   Collection Time   04/30/12  4:42 PM      Component Value Range Status Comment   Colony Count NO GROWTH   Final    Organism ID, Bacteria NO GROWTH   Final   CSF CULTURE     Status: Normal (Preliminary result)   Collection Time   05/06/12 12:25 PM      Component Value Range Status Comment   Specimen Description CSF   Final    Special Requests NONE   Final    Gram Stain     Final    Value: NO WBC SEEN     NO ORGANISMS SEEN   Culture NO GROWTH 1 DAY   Final    Report Status PENDING   Incomplete   GRAM STAIN     Status: Normal   Collection Time   05/06/12 12:25 PM      Component Value Range Status Comment   Specimen Description CSF   Final     Special Requests NONE   Final    Gram Stain     Final    Value: WBC SEEN     NO ORGANISMS SEEN     Gram Stain Report Called to,Read Back By and Verified With:     Margarita Rana RN AT 1325 ON [phone] BY SHUEA   Report Status 05/06/2012 FINAL   Final      Studies:              All Imaging reviewed and is as per above notation   Scheduled Meds:   . acetaminophen      . fluticasone  2 spray Each Nare Daily  . heparin  5,000 Units Subcutaneous Q8H  . metoCLOPramide (REGLAN) injection  10 mg Intravenous Once  . potassium chloride  40 mEq Oral Once  . sodium chloride  1,000 mL Intravenous Once   Continuous Infusions:   . sodium chloride 100 mL/hr at 05/08/12 1500     1. Viral illness-? Hepatitis-LFT's slighlt elevated. Mentions she take sa OTC weight loss supplement.  HUsband mentions 1-2 days prior to concert he had diarrhoea.  WIll get Acute Hapatitis serologies.  She has never been transfused to her knowledge in the past-  Await other viral serolgoies as per above-HIV 9/21 was neg-US RUQ ordered 9/23.  Tylenol d/c'd 2. Syncope and collapse-differential diagnosis Vaso-vagal, New Sz disorder-Hold EEG for now given no focal neuro findings 3. Mitral valve prolapse-EKG reviewed this morning and seemed normal. I will get an echocardiogram to delineate valvular pathology. Patient claims to me that she drinks about 100 ounces of water a day and has only been able to keep up the 50 or 60 ounce water in spite of doing her regular activities since she's been 69/11/2011.  orthostatic blood pressures are negative  4. ? [place] spotted fever-I've consulted Dr. Ninetta Lights to help me determine if there is any other further workup that we need to pursue. Thank you for your assistance in this interesting case 5. Likely viral exanthem-DDx [facility] spotted fever versus [place] or Western equine versus HSV meningitis-this dates back 17 days 2 04/21/2012 and do not think there is a role for further workup  at this stage. She has had a lumbar puncture for this which are shown no organisms-her differential count confirms mild neutropenai and lymphocytosis and we'll repeat his CBC with differential count 9/22 am-also get LFTs and INR in the morning   Code Status: FUll  Family Communication:  Discussed c Husband and pt., POC this am Disposition Plan: Inpatient pedning further work uip   Pleas Koch, MD  [facility] Pager [phone] 05/09/2012, 9:06 AM    LOS: 1 day

## 2012-05-10 DIAGNOSIS — R509 Fever, unspecified: Secondary | ICD-10-CM

## 2012-05-10 DIAGNOSIS — R21 Rash and other nonspecific skin eruption: Secondary | ICD-10-CM

## 2012-05-10 LAB — COMPREHENSIVE METABOLIC PANEL
Albumin: 2.6 g/dL — ABNORMAL LOW (ref 3.5–5.2)
Alkaline Phosphatase: 74 U/L (ref 39–117)
BUN: 13 mg/dL (ref 6–23)
Creatinine, Ser: 0.72 mg/dL (ref 0.50–1.10)
GFR calc Af Amer: >90 mL/min (ref >90)
Glucose, Bld: 110 mg/dL — ABNORMAL HIGH (ref 70–99)
Total Protein: 5.9 g/dL — ABNORMAL LOW (ref 6.0–8.3)

## 2012-05-10 LAB — CMV IGM: CMV IgM: 3.28 — ABNORMAL HIGH (ref <0.90)

## 2012-05-10 LAB — EPSTEIN-BARR VIRUS VCA ANTIBODY PANEL
EBV EA IgG: 87.7 U/mL — ABNORMAL HIGH (ref <9.0)
EBV NA IgG: >600 U/mL — ABNORMAL HIGH (ref <18.0)
EBV VCA IgG: >750 U/mL — ABNORMAL HIGH (ref <18.0)

## 2012-05-10 LAB — ARBOVIRUS PANEL, ~~LOC~~ LAB

## 2012-05-10 LAB — ANA: Anti Nuclear Antibody(ANA): POSITIVE — AB

## 2012-05-10 LAB — CSF CULTURE W GRAM STAIN: Culture: NO GROWTH

## 2012-05-10 LAB — GLUCOSE, CAPILLARY: Glucose-Capillary: 106 mg/dL — ABNORMAL HIGH (ref 70–99)

## 2012-05-10 LAB — CBC WITH DIFFERENTIAL/PLATELET
Basophils Absolute: 0.2 10*3/uL — ABNORMAL HIGH (ref 0.0–0.1)
Basophils Relative: 2 % — ABNORMAL HIGH (ref 0–1)
Eosinophils Absolute: 0.2 10*3/uL (ref 0.0–0.7)
HCT: 32.8 % — ABNORMAL LOW (ref 36.0–46.0)
Hemoglobin: 11.2 g/dL — ABNORMAL LOW (ref 12.0–15.0)
Lymphocytes Relative: 65 % — ABNORMAL HIGH (ref 12–46)
MCHC: 34.1 g/dL (ref 30.0–36.0)
Monocytes Relative: 10 % (ref 3–12)
Neutro Abs: 1.6 10*3/uL — ABNORMAL LOW (ref 1.7–7.7)
Neutrophils Relative %: 21 % — ABNORMAL LOW (ref 43–77)
RDW: 13.7 % (ref 11.5–15.5)
WBC: 7.6 10*3/uL (ref 4.0–10.5)

## 2012-05-10 LAB — ANCA SCREEN W REFLEX TITER
Atypical p-ANCA Screen: NEGATIVE
c-ANCA Screen: NEGATIVE

## 2012-05-10 LAB — ANTI-NUCLEAR AB-TITER (ANA TITER): ANA Titer 1: NEGATIVE

## 2012-05-10 MED ORDER — ONDANSETRON HCL 4 MG PO TABS: 4.0000 mg | ORAL_TABLET | ORAL | Status: DC | PRN

## 2012-05-10 MED ORDER — PROMETHAZINE HCL 25 MG/ML IJ SOLN
12.5000 mg | INTRAMUSCULAR | Status: DC | PRN
  Administered 2012-05-10: 12.5 mg via INTRAVENOUS
  Filled 2012-05-10: qty 1

## 2012-05-10 MED ORDER — SODIUM CHLORIDE 0.9 % IV SOLN
INTRAVENOUS | Status: DC
  Administered 2012-05-10 (×2): via INTRAVENOUS

## 2012-05-10 MED ORDER — OXYCODONE HCL 5 MG PO TABS
5.0000 mg | ORAL_TABLET | ORAL | Status: DC | PRN
  Administered 2012-05-10: 5 mg via ORAL
  Filled 2012-05-10: qty 1

## 2012-05-10 MED ORDER — SUMATRIPTAN 20 MG/ACT NA SOLN
20.0000 mg | NASAL | Status: DC | PRN
  Administered 2012-05-11: 20 mg via NASAL
  Filled 2012-05-10: qty 1

## 2012-05-10 MED ORDER — METOCLOPRAMIDE HCL 5 MG/ML IJ SOLN
5.0000 mg | Freq: Three times a day (TID) | INTRAMUSCULAR | Status: DC
  Administered 2012-05-10 – 2012-05-11 (×4): 5 mg via INTRAVENOUS
  Filled 2012-05-10: qty 1
  Filled 2012-05-10 (×2): qty 2
  Filled 2012-05-10: qty 1
  Filled 2012-05-10: qty 2
  Filled 2012-05-10: qty 1

## 2012-05-10 NOTE — Progress Notes (Signed)
Patient ID: Jessica Mullen, female   DOB: 07/19/1961, 51 y.o.   MRN: [id number]    [facility]    Date of Admission:  05/08/2012     Principal Problem:  *Syncope and collapse Active Problems:  FATIGUE  SKIN RASH  MVP (mitral valve prolapse)  Viral illness  Mitral valve prolapse syndrome      . acetaminophen      . fluticasone  2 spray Each Nare Daily  . heparin  5,000 Units Subcutaneous Q8H  . ibuprofen  600 mg Oral Once  . metoCLOPramide (REGLAN) injection  5 mg Intravenous Q8H    Subjective: She had more nausea this morning with one episode of vomiting today and is back on IV fluids.  Objective: Temp:  [97.7 F (36.5 C)-98.7 F (37.1 C)] 98 F (36.7 C) (09/23 1433) Pulse Rate:  [62-82] 82  (09/23 1433) Resp:  [18-20] 20  (09/23 1433) BP: (100-107)/(46-62) 100/46 mmHg (09/23 1433) SpO2:  [97 %-100 %] 98 % (09/23 1433)  General: Alert and comfortable Skin: Very faint fading petechial rash on her shins No palpable adenopathy Oral: Clear Lungs: Clear Cor: Regular S1 and S2 with no murmur or rub Abdomen: Soft with mild upper quadrant tenderness with palpation on deep inspiration. Soft liver edge just palpable at the lower sternal margin on deep inspiration. I do not palpate her spleen. Joints extremities: Normal  Lab Results Lab Results  Component Value Date   WBC 7.6 05/10/2012   HGB 11.2* 05/10/2012   HCT 32.8* 05/10/2012   MCV 90.4 05/10/2012   PLT 162 05/10/2012    Lab Results  Component Value Date   CREATININE 0.72 05/10/2012   BUN 13 05/10/2012   NA 138 05/10/2012   K 3.5 05/10/2012   CL 103 05/10/2012   CO2 26 05/10/2012    Lab Results  Component Value Date   ALT 254* 05/10/2012   AST 228* 05/10/2012   ALKPHOS 74 05/10/2012   BILITOT 0.3 05/10/2012      Microbiology: Recent Results (from the past 240 hour(s))  URINE CULTURE     Status: Normal   Collection Time   04/30/12  4:42 PM      Component Value Range Status Comment   Colony Count NO GROWTH   Final    Organism ID, Bacteria NO GROWTH   Final   CSF CULTURE     Status: Normal   Collection Time   05/06/12 12:25 PM      Component Value Range Status Comment   Specimen Description CSF   Final    Special Requests NONE   Final    Gram Stain     Final    Value: NO WBC SEEN     NO ORGANISMS SEEN   Culture NO GROWTH 3 DAYS   Final    Report Status 05/10/2012 FINAL   Final   GRAM STAIN     Status: Normal   Collection Time   05/06/12 12:25 PM      Component Value Range Status Comment   Specimen Description CSF   Final    Special Requests NONE   Final    Gram Stain     Final    Value: WBC SEEN     NO ORGANISMS SEEN     Gram Stain Report Called to,Read Back By and Verified With:     Margarita Rana RN AT 1325 ON [id number] BY SHUEA   Report Status 05/06/2012 FINAL  Final     Studies/Results: US Abdomen Complete  05/09/2012  *RADIOLOGY REPORT*  Clinical Data:  Elevated liver function tests  COMPLETE ABDOMINAL ULTRASOUND  Comparison:  Abdomen and pelvis CT, 12/24/2010  Findings:  Gallbladder:  Gallbladder is moderately distended.  There are three small polyps, but no stones or wall thickening or evidence of acute cholecystitis.  Common bile duct:  Normal in caliber measuring 5 mm  Liver:  No focal lesion identified.  Within normal limits in parenchymal echogenicity.  IVC:  Appears normal.  Pancreas:  Normal in size and echogenicity with no masses  Spleen:  Spleen is mildly enlarged measuring 14.1 cm x 14.9 cm x 5 cm.  This yields a volume of 552 ml.  No splenic mass or focal lesion.  Right Kidney:  Normal measuring 12.1 cm in length  Left Kidney:  Normal measuring 10 cm in length  Abdominal aorta:  Normal in caliber  IMPRESSION: Normal gallbladder.  No bile duct dilation.  Normal liver.  Mild splenomegaly of unclear etiology.  Exam otherwise unremarkable.   Original Report Authenticated By: Domenic Moras, M.D.     Assessment: The exact cause of her three-week illness  remains uncertain but I note that her cytomegalovirus IgM antibody is positive along with her IgG antibody. It usually takes a little bit longer than 3 weeks for the IgG antibody of strongly positive but her illness is certainly compatible with acute cytomegalovirus mononucleosis. I could explain her intermittent fevers, leukocytosis, hepatitis, rash and mild splenomegaly. I believe the positive ANA is insignificant. I would continue observation off of antibiotics.  Plan: 1. Continue observation off of antibiotics  Cliffton Asters, MD [facility] [phone] pager   [phone] cell 05/10/2012, 3:42 PM

## 2012-05-10 NOTE — Progress Notes (Signed)
Patient has another headache. 7/10 scale. She is mildly diaphoretic. Checked vs (see in EPIC). No fever; checked CBG 106. I ordered Oxycodone 5mg  for pain and patient is presently resting. I will continue to monitor her for any changes during remainder of shift.

## 2012-05-10 NOTE — Progress Notes (Signed)
Pt BP 100/46 HR 82.  MD notified.

## 2012-05-10 NOTE — Progress Notes (Signed)
PROGRESS NOTE  Jessica Mullen ZOX:[id number] DOB: 11/03/1960 DOA: 05/08/2012 PCP: Nani Gasser, MD  Brief narrative: 51 yr old female admitted after extended convalsecence from ? Viral illness.  Bacterial meninigitis ruled out on 9/19 with LP, but as she continued to have further HA's, sweats and malaise , re-presented to [facility] ED 9/22 and was ultimately admitted to further work-up  Past medical history-As per Problem list Chart review  Left Pelvic mass s/p L Salpingo-oophorectomy 2003  S/p transurethral polpy resection and re-implantation L Ureter  S/p Hysteroscopy 10/31/2005 with endometrial currettage and biospy  Seen by Dr. Antoine Poche 07/05/2007 with Chest discomfort-noted on that HPI to have syncopal episodes related to GI illness in March of 2008  Rx Bug bite doxycycline 01/01/2010-culture + then for staph, thoguht to potentially be HSV simplex??  Consultants:  ID-Dr. Ninetta Lights (consult appreciated-thank you)  Procedures:  CXR 1 view 9/19=neg  CT head 9/19=neg  Echo 9/22=chorgae tendinae  [place] 9/22=Splenomegally  Antibiotics/ID  LAbs  Ehrlichia P  EBV P  CMV P  Western Nile P  Arbovirus CSF-Needs to be added  Leptospiral AB-P  Acute Hepatitis panel neg  ANA/ANCA P   Subjective  Feels much worse! Headache started yesterday (behind eyes) then she developed chills c out fever or rigors and then became nauseous-Husband  Noticed vomit was clear~300 cc, no specks Husband @ bedside   Objective    Interim History: Nad  Non tele   Objective: Filed Vitals:   05/09/12 2128 05/10/12 0251 05/10/12 0549 05/10/12 0847  BP: 101/49 107/55 105/62   Pulse: 62 67 70   Temp: 98.7 F (37.1 C) 98.3 F (36.8 C) 97.7 F (36.5 C) 98 F (36.7 C)  TempSrc: Oral Oral Oral Oral  Resp: 18 18 20    Height:      Weight:      SpO2: 100%  97%     Intake/Output Summary (Last 24 hours) at 05/10/12 0945 Last data filed at 05/10/12 0551  Gross per 24 hour  Intake    240  ml  Output   2300 ml  Net  -2060 ml    Exam:  General: Alert oriented pleasant Caucasian female NAD this morning  Eyes: nothing changed ENT: Throat clear  Neck: Soft, supple, no JVD, no bruit  Cardiovascular: S1-S2 regular rate rhythm, grade 2-3 ESM LUSE Respiratory: Clinically clear no added sound  Abdomen: tender c deep palpation RUQ  Data Reviewed: Basic Metabolic Panel:  Lab 05/10/12 8119 05/09/12 0520 05/08/12 1001 05/06/12 0850  NA 138 138 139 135  K 3.5 3.9 -- --  CL 103 107 103 99  CO2 26 25 29 28   GLUCOSE 110* 113* 171* 124*  BUN 13 16 17 14   CREATININE 0.72 0.65 0.77 0.83  CALCIUM 8.4 8.3* 8.8 8.7  MG -- -- -- --  PHOS -- -- -- --   Liver Function Tests:  Lab 05/10/12 0413 05/09/12 0520  AST 228* 185*  ALT 254* 191*  ALKPHOS 74 59  BILITOT 0.3 0.3  PROT 5.9* 5.7*  ALBUMIN 2.6* 2.4*   No results found for this basename: LIPASE:5,AMYLASE:5 in the last 168 hours No results found for this basename: AMMONIA:5 in the last 168 hours CBC:  Lab 05/09/12 0520 05/08/12 1001 05/06/12 0850  WBC 7.3 5.3 8.1  NEUTROABS -- -- 3.4  HGB 10.5* 12.5 12.6  HCT 31.3* 37.1 36.8  MCV 89.2 89.2 87.8  PLT 140* 139* 139*   Cardiac Enzymes: No results found for this basename: CKTOTAL:5,CKMB:5,CKMBINDEX:5,TROPONINI:5  in the last 168 hours BNP: No components found with this basename: POCBNP:5 CBG:  Lab 05/10/12 0259  GLUCAP 106*    Recent Results (from the past 240 hour(s))  URINE CULTURE     Status: Normal   Collection Time   04/30/12  4:42 PM      Component Value Range Status Comment   Colony Count NO GROWTH   Final    Organism ID, Bacteria NO GROWTH   Final   CSF CULTURE     Status: Normal (Preliminary result)   Collection Time   05/06/12 12:25 PM      Component Value Range Status Comment   Specimen Description CSF   Final    Special Requests NONE   Final    Gram Stain     Final    Value: NO WBC SEEN     NO ORGANISMS SEEN   Culture NO GROWTH 2 DAYS   Final      Report Status PENDING   Incomplete   GRAM STAIN     Status: Normal   Collection Time   05/06/12 12:25 PM      Component Value Range Status Comment   Specimen Description CSF   Final    Special Requests NONE   Final    Gram Stain     Final    Value: WBC SEEN     NO ORGANISMS SEEN     Gram Stain Report Called to,Read Back By and Verified With:     Margarita Rana RN AT 1325 ON [phone] BY SHUEA   Report Status 05/06/2012 FINAL   Final      Studies:              All Imaging reviewed and is as per above notation   Scheduled Meds:    . acetaminophen      . fluticasone  2 spray Each Nare Daily  . heparin  5,000 Units Subcutaneous Q8H  . ibuprofen  600 mg Oral Once  . metoCLOPramide (REGLAN) injection  5 mg Intravenous Q8H   Continuous Infusions:    . sodium chloride       1. Viral illness-? Hepatitis-LFT's slightly elevated. Mentions she takes OTC weight loss supplement.  Husband mentions 1-2 days prior to concert he had diarrhoea.  She allegedly participated in a "mud run".  Acute Hepatitis serologies neg.  Leptospira AB pending.  Never been transfused to her knowledge in the past-  Await other viral serolgoies as per above-HIV 9/21 was neg-Tylenol d/c'd 2. Splenomegaly?-unclear significance-await EBV/CMV/ANA.  Should TB be ruled out?-get DS-DNA as well (more specific).  Hold further imaging (MRA/CTA) until seen by ID Physician-I am inclined to think that this is more infectious>cryptogenic, but will get a CBC and Differential as Diff on 9/20 showed relative Neutropenia and Lymphocytosis which typically is more in keeping with Viral Causes-Ddx Lymphocytic hematological process.  Discussed with Radiologist, Dr. Dia Sitter focal masses confirmed.  Called and discussed case c Dr. Kandis Mannan to discuss potential work-up for the same-he recommends 3. Syncope and collapse-differential diagnosis Vaso-vagal, New Sz disorder-Hold EEG for now given no focal neuro findings-Given her N/V, I have  re-started IVF @75cc /hour 4. Mitral valve prolapse-EKG reviewed this morning and seemed normal.  Echo showed Attached Chordae Tendinae-Hold TEE for now-No fever, Patient claims to me that she drinks about 100 ounces of water a day and has only been able to keep up the 50 or 60 ounce water in spite of doing her regular activities  since she's been 04/21/2012.  orthostatic pressures are negative  5. Likely viral exanthem-DDx [facility] spotted fever versus [place] or Western equine versus HSV meningitis-this dates back 17 days 2 04/21/2012 and do not think there is a role for further workup at this stage. She has had a lumbar puncture for this which are shown no organisms-her differential count confirms mild neutropenai and lymphocytosis-see above  Code Status: Full  Family Communication: Discussed c Husband and pt, POC this am-understand course of care to date. Disposition Plan: Inpatient pending further work up.  Will review patient later today   Pleas Koch, MD  [facility] Pager [phone] 05/10/2012, 9:45 AM    LOS: 2 days

## 2012-05-11 ENCOUNTER — Ambulatory Visit: Payer: PRIVATE HEALTH INSURANCE | Admitting: Family Medicine

## 2012-05-11 ENCOUNTER — Ambulatory Visit: Payer: PRIVATE HEALTH INSURANCE | Admitting: Cardiovascular Disease

## 2012-05-11 NOTE — Discharge Summary (Addendum)
Physician Discharge Summary  ROZA CREAMER ZOX:[id number] DOB: [birth year] DOA: 05/08/2012  PCP: Nani Gasser, MD  Admit date: 05/08/2012 Discharge date: 05/11/2012  Recommendations for Outpatient Follow-up:  1. Needs CMET + cbc in 2-3 days 2. Pleas follow up DS-DNA 3. See PCP in 5 days please. 4.  No contact sports until seen by PCP. 5. Please follow-up further viral work-up as indicated below that is currently not available  Discharge Diagnoses:  Principal Problem:  *Syncope and collapse Active Problems:  FATIGUE  SKIN RASH  MVP (mitral valve prolapse)  Viral illness  Mitral valve prolapse syndrome  51 yr old female admitted after extended convalsecence from ? Viral illness. Bacterial meninigitis ruled out on 9/19 with LP in the [facility] emergency room, but as she continued to have further HA's, sweats and malaise , re-presented to [facility] ED 9/22 and was ultimately admitted to further work-up   1. Viral illness-thought to be CMV Hepatitis LFT's slightly elevated. Mentions she takes OTC weight loss supplement. Husband mentions 1-2 days prior to concert he had diarrhoea. She allegedly participated in a "mud run". Acute Hepatitis serologies neg. Leptospira AB pending. Never been transfused to her knowledge in the past- -HIV 9/21 was neg-Tylenol d/c'd 2. Splenomegaly-2/2 to CMV Hepatitis-unclear significance-await EBV +CMV+ ANA was nonspecific as per above.  DS-DNA Pending (please follow-up)--Neutropenia and Lymphocytosis which typically is more in keeping with Viral Causes-Ddx Lymphocytic hematological process. Discussed with Radiologist, Dr. Dia Sitter focal masses confirmed. Called and discussed case c Dr. Kandis Mannan to discuss potential work-up for the same-he recommended following Viral studies. 3. Syncope and collapse-differential diagnosis Vaso-vagal, New Sz disorder-Hold EEG for now given no focal neuro findings-Experiences transient N/v throughout hospital stay which finally  resovevd on day of D/c home 4. Mitral valve prolapse-EKG reviewed this morning and seemed normal. Echo showed Attached Chordae Tendinae-Hold TEE for now-No fever, Patient claims to me that she drinks about 100 ounces of water a day and has only been able to keep up the 50 or 60 ounce water in spite of doing her regular activities since she's been 04/21/2012. orthostatic pressures are negative-mo further current work-up indicated.  Might need Cardiology input in the remote future 5. Viral exanthem-DDx [facility] spotted fever versus [place] or Western equine versus HSV meningitis-this dates back 17 days 2 04/21/2012 and do not think there is a role for further workup at this stage. She has had a lumbar puncture for this which are shown no organisms  Chart review  Left Pelvic mass s/p L Salpingo-oophorectomy 2003  S/p transurethral polpy resection and re-implantation L Ureter  S/p Hysteroscopy 10/31/2005 with endometrial currettage and biospy  Seen by Dr. Antoine Poche 07/05/2007 with Chest discomfort-noted on that HPI to have syncopal episodes related to GI illness in March of 2008  Rx Bug bite doxycycline 01/01/2010-culture + then for staph, thoguht to potentially be HSV simplex??  Consultants:  ID-Dr. Ninetta Lights (consult appreciated-thank you)  Procedures:  CXR 1 view 9/19=neg  CT head 9/19=neg  Echo 9/22=chorgae tendinae  [place] 9/22=Splenomegally(mild)  Antibiotics/ID LAbs  Ehrlichia P  EBV IgM/IgG=>750/>600 CMV IgM=3.28 and IgG 5.75 (elevated) Western Nile-sent to [employer] [facility] 9/23 P Arbovirus CSF-Needs to be added  Leptospiral AB-P  Acute Hepatitis panel neg  ANA was +, thought to be non-specific (<1:40)-Pattern was not indicated ANCA P E [place] Igm/Igg was negative   Discharge Condition: Stable  Diet recommendation: Soft, bland diet  Filed Weights   05/08/12 1450  Weight: 65.137 kg (143 lb 9.6 oz)   Discharge  Exam: Filed Vitals:   05/10/12 0847 05/10/12 1433 05/10/12 2200  05/11/12 0514  BP:  100/46 109/63 105/65  Pulse:  82 79 76  Temp: 98 F (36.7 C) 98 F (36.7 C) 99.2 F (37.3 C) 98.1 F (36.7 C)  TempSrc: Oral Oral Oral Oral  Resp:  20 18 16   Height:      Weight:      SpO2:  98% 98% 96%    General: Alert oriented pleasant Caucasian female NAD this morning  Eyes: nothing changed  ENT: Throat clear  Neck: Soft, supple, no JVD, no bruit  Cardiovascular: S1-S2 regular rate rhythm, grade 2-3 ESM LUSE  Respiratory: Clinically clear no added sound  Abdomen: tender c deep palpation RUQ, but less so than prior   Discharge Instructions  Follow-up Information    Follow up with METHENEY,CATHERINE, MD. In 5 days.   Contact information:   [address]  [address]  [place] [address] [phone]              Medication List     As of 05/11/2012  3:51 PM    STOP taking these medications         doxycycline 100 MG capsule   Commonly known as: VIBRAMYCIN      TAKE these medications         acetaminophen 500 MG tablet   Commonly known as: TYLENOL   Take 1,000 mg by mouth once.      cetirizine 5 MG tablet   Commonly known as: ZYRTEC   Take 5 mg by mouth daily.      fluticasone 50 MCG/ACT nasal spray   Commonly known as: FLONASE   Place 2 sprays into the nose daily.          The results of significant diagnostics from this hospitalization (including imaging, microbiology, ancillary and laboratory) are listed below for reference.    Significant Diagnostic Studies: Dg Chest 1 View  05/06/2012  *RADIOLOGY REPORT*  Clinical Data: Syncope.  Dizziness.  CHEST - 1 VIEW  Comparison: None.  Findings: Heart size and pulmonary vascularity are normal and the lungs are clear.  There is a thoracolumbar scoliosis.  No acute osseous abnormality.  IMPRESSION: No acute disease.   Original Report Authenticated By: Gwynn Burly, M.D.    Ct Head Wo Contrast  05/06/2012  *RADIOLOGY REPORT*  Clinical Data: Loss of consciousness  CT  HEAD WITHOUT CONTRAST  Technique:  Contiguous axial images were obtained from the base of the skull through the vertex without contrast.  Comparison: 03/06/2008  Findings: There is no evidence for acute hemorrhage, hydrocephalus, mass lesion, or abnormal extra-axial fluid collection.  No definite CT evidence for acute infarction.  The visualized paranasal sinuses and mastoid air cells are predominately clear.  The  IMPRESSION: No acute intracranial abnormality.   Original Report Authenticated By: Waneta Martins, M.D.    US Abdomen Complete  05/09/2012  *RADIOLOGY REPORT*  Clinical Data:  Elevated liver function tests  COMPLETE ABDOMINAL ULTRASOUND  Comparison:  Abdomen and pelvis CT, 12/24/2010  Findings:  Gallbladder:  Gallbladder is moderately distended.  There are three small polyps, but no stones or wall thickening or evidence of acute cholecystitis.  Common bile duct:  Normal in caliber measuring 5 mm  Liver:  No focal lesion identified.  Within normal limits in parenchymal echogenicity.  IVC:  Appears normal.  Pancreas:  Normal in size and echogenicity with no masses  Spleen:  Spleen  is mildly enlarged measuring 14.1 cm x 14.9 cm x 5 cm.  This yields a volume of 552 ml.  No splenic mass or focal lesion.  Right Kidney:  Normal measuring 12.1 cm in length  Left Kidney:  Normal measuring 10 cm in length  Abdominal aorta:  Normal in caliber  IMPRESSION: Normal gallbladder.  No bile duct dilation.  Normal liver.  Mild splenomegaly of unclear etiology.  Exam otherwise unremarkable.   Original Report Authenticated By: Domenic Moras, M.D.     Microbiology: Recent Results (from the past 240 hour(s))  CSF CULTURE     Status: Normal   Collection Time   05/06/12 12:25 PM      Component Value Range Status Comment   Specimen Description CSF   Final    Special Requests NONE   Final    Gram Stain     Final    Value: NO WBC SEEN     NO ORGANISMS SEEN   Culture NO GROWTH 3 DAYS   Final    Report Status  05/10/2012 FINAL   Final   GRAM STAIN     Status: Normal   Collection Time   05/06/12 12:25 PM      Component Value Range Status Comment   Specimen Description CSF   Final    Special Requests NONE   Final    Gram Stain     Final    Value: WBC SEEN     NO ORGANISMS SEEN     Gram Stain Report Called to,Read Back By and Verified WithMargarita Rana RN AT 1325 ON [id number] BY SHUEA   Report Status 05/06/2012 FINAL   Final      Labs: Basic Metabolic Panel:  Lab 05/10/12 0454 05/09/12 0520 05/08/12 1001 05/06/12 0850  NA 138 138 139 135  K 3.5 3.9 3.3* 3.9  CL 103 107 103 99  CO2 26 25 29 28   GLUCOSE 110* 113* 171* 124*  BUN 13 16 17 14   CREATININE 0.72 0.65 0.77 0.83  CALCIUM 8.4 8.3* 8.8 8.7  MG -- -- -- --  PHOS -- -- -- --   Liver Function Tests:  Lab 05/10/12 0413 05/09/12 0520  AST 228* 185*  ALT 254* 191*  ALKPHOS 74 59  BILITOT 0.3 0.3  PROT 5.9* 5.7*  ALBUMIN 2.6* 2.4*   No results found for this basename: LIPASE:5,AMYLASE:5 in the last 168 hours No results found for this basename: AMMONIA:5 in the last 168 hours CBC:  Lab 05/10/12 0425 05/09/12 0520 05/08/12 1001 05/06/12 0850  WBC 7.6 7.3 5.3 8.1  NEUTROABS 1.6* -- -- 3.4  HGB 11.2* 10.5* 12.5 12.6  HCT 32.8* 31.3* 37.1 36.8  MCV 90.4 89.2 89.2 87.8  PLT 162 140* 139* 139*   Cardiac Enzymes: No results found for this basename: CKTOTAL:5,CKMB:5,CKMBINDEX:5,TROPONINI:5 in the last 168 hours BNP: BNP (last 3 results) No results found for this basename: PROBNP:3 in the last 8760 hours CBG:  Lab 05/10/12 0259  GLUCAP 106*    Time coordinating discharge: 45 minutes  Signed:  Rhetta Mura  [facility] 05/11/2012, 11:20 AM

## 2012-05-12 NOTE — Care Management Note (Addendum)
    Page 1 of 1   05/12/2012     11:27:21 AM   CARE MANAGEMENT NOTE 05/12/2012  Patient:  Jessica Mullen, Jessica Mullen   Account Number:  [id number]  Date Initiated:  05/12/2012  Documentation initiated by:  Lanier Clam  Subjective/Objective Assessment:   ADMITTED W/SYNCOPE     Action/Plan:   FROM HOME   Anticipated DC Date:  05/11/2012   Anticipated DC Plan:  HOME/SELF CARE      DC Planning Services  CM consult      Choice offered to / List presented to:             Status of service:  Completed, signed off Medicare Important Message given?   (If response is "NO", the following Medicare IM given date fields will be blank) Date Medicare IM given:   Date Additional Medicare IM given:    Discharge Disposition:  HOME/SELF CARE  Per UR Regulation:  Reviewed for med. necessity/level of care/duration of stay  If discussed at Long Length of Stay Meetings, dates discussed:    Comments:

## 2012-05-13 ENCOUNTER — Encounter: Payer: Self-pay | Admitting: *Deleted

## 2012-05-14 LAB — ANTI-DNA ANTIBODY, DOUBLE-STRANDED: ds DNA Ab: 3 IU/mL (ref <30)

## 2012-05-18 ENCOUNTER — Ambulatory Visit (INDEPENDENT_AMBULATORY_CARE_PROVIDER_SITE_OTHER): Payer: PRIVATE HEALTH INSURANCE | Admitting: Family Medicine

## 2012-05-18 ENCOUNTER — Encounter: Payer: Self-pay | Admitting: Family Medicine

## 2012-05-18 ENCOUNTER — Other Ambulatory Visit: Payer: Self-pay | Admitting: Family Medicine

## 2012-05-18 VITALS — BP 128/79 | HR 85 | Wt 142.0 lb

## 2012-05-18 DIAGNOSIS — R7401 Elevation of levels of liver transaminase levels: Secondary | ICD-10-CM

## 2012-05-18 DIAGNOSIS — B199 Unspecified viral hepatitis without hepatic coma: Secondary | ICD-10-CM

## 2012-05-18 DIAGNOSIS — B251 Cytomegaloviral hepatitis: Secondary | ICD-10-CM

## 2012-05-18 DIAGNOSIS — R748 Abnormal levels of other serum enzymes: Secondary | ICD-10-CM

## 2012-05-18 DIAGNOSIS — B259 Cytomegaloviral disease, unspecified: Secondary | ICD-10-CM

## 2012-05-18 NOTE — Progress Notes (Signed)
  Subjective:    Patient ID: Jessica Mullen, female    DOB: 01-16-1961, 51 y.o.   MRN: [id number]  HPI She was recently hospitalized with CMV hepatitis.  Says she is extremely tired and weak.  Did have a petchial rash but it is almost resolved.  Urine was orange but now is clear.  Says got better thsi week.  Says she is not having any fever.  Still dec appetite. Has lost 3 lbs.  Her joint pain is better. She does still have some soreness in the abdomen.   Reviewed hospital records.    Review of Systems     Objective:   Physical Exam  Constitutional: She is oriented to person, place, and time. She appears well-developed and well-nourished.  HENT:  Head: Normocephalic and atraumatic.  Eyes: Conjunctivae normal are normal. Pupils are equal, round, and reactive to light.  Cardiovascular: Normal rate, regular rhythm and normal heart sounds.   Pulmonary/Chest: Effort normal and breath sounds normal.  Abdominal: Soft. Bowel sounds are normal. She exhibits no distension and no mass. There is tenderness. There is no rebound and no guarding.  Neurological: She is alert and oriented to person, place, and time.  Skin: Skin is warm and dry.  Psychiatric: She has a normal mood and affect. Her behavior is normal.          Assessment & Plan:  CMV Hepatitis - Will recheck liver enzymes today.  We will also recheck her CBC with differential to see if her neutrophils are coming down. Her platelets are back to normal right before discharge. Continue to work on diet and rest as much as possible. Work note given for September 19 through the 30th. At this point I recommend she start working half days for one week and I will see her back a week to make sure that she is improving.  After maybe another month before she's able to get back to working out at the gym. Certainly if she wants to start walking a short distance such as half a mile on her own she can do that as tolerated. Explained her that it may even  take a couple months to get back to normal. Sometimes the fatigue can hang around for quite some time.

## 2012-05-19 LAB — HEPATIC FUNCTION PANEL
AST: 52 U/L — ABNORMAL HIGH (ref 0–37)
Albumin: 4 g/dL (ref 3.5–5.2)
Alkaline Phosphatase: 62 U/L (ref 39–117)
Indirect Bilirubin: 0.5 mg/dL (ref 0.0–0.9)
Total Bilirubin: 0.6 mg/dL (ref 0.3–1.2)

## 2012-05-19 LAB — CBC WITH DIFFERENTIAL/PLATELET
Eosinophils Absolute: 0.1 10*3/uL (ref 0.0–0.7)
Hemoglobin: 13.1 g/dL (ref 12.0–15.0)
Lymphs Abs: 3.7 10*3/uL (ref 0.7–4.0)
MCH: 30.1 pg (ref 26.0–34.0)
MCV: 89.2 fL (ref 78.0–100.0)
Monocytes Relative: 11 % (ref 3–12)
Neutrophils Relative %: 32 % — ABNORMAL LOW (ref 43–77)
RBC: 4.35 MIL/uL (ref 3.87–5.11)

## 2012-05-21 ENCOUNTER — Telehealth: Payer: Self-pay | Admitting: Physician Assistant

## 2012-05-21 ENCOUNTER — Telehealth: Payer: Self-pay | Admitting: *Deleted

## 2012-05-21 ENCOUNTER — Encounter: Payer: Self-pay | Admitting: Physician Assistant

## 2012-05-21 ENCOUNTER — Ambulatory Visit (INDEPENDENT_AMBULATORY_CARE_PROVIDER_SITE_OTHER): Payer: PRIVATE HEALTH INSURANCE | Admitting: Physician Assistant

## 2012-05-21 VITALS — BP 111/71 | HR 85 | Temp 98.0°F | Ht 63.0 in | Wt 142.0 lb

## 2012-05-21 DIAGNOSIS — L0390 Cellulitis, unspecified: Secondary | ICD-10-CM

## 2012-05-21 DIAGNOSIS — L0291 Cutaneous abscess, unspecified: Secondary | ICD-10-CM

## 2012-05-21 MED ORDER — DOXYCYCLINE HYCLATE 100 MG PO CAPS: 100.0000 mg | ORAL_CAPSULE | Freq: Two times a day (BID) | ORAL | Status: DC

## 2012-05-21 NOTE — Progress Notes (Signed)
  Subjective:    Patient ID: Jessica Mullen, female    DOB: Sep 24, 1960, 50 y.o.   MRN: [id number]  HPI Patient is 51 yo female who presents to clinic with red bump under her right breast. She has noticed it for only 1 day and done nothing to make better or worse. 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 Denies any past history of MRSA but did have an infection on her butt in 2011. Denies any fever, chills, muscle aches, breast tenderness. Denies any nipple discharge.   Review of Systems     Objective:   Physical Exam  Constitutional: She is oriented to person, place, and time. She appears well-developed and well-nourished.  HENT:  Head: Normocephalic and atraumatic.  Abdominal: Bowel sounds are normal.  Neurological: She is alert and oriented to person, place, and time.  Skin:       Abscess under right breast. Pustule on top of area of erythema. Warm to touch. Not draining closed top.   Psychiatric: She has a normal mood and affect. Her behavior is normal.          Assessment & Plan:  Abscess under right breast- Lanced and culture discharge. Covered and gave h.o. On abscess. Encouraged patient to keep area clean and start doxycycline twice a day for next 10 days. Will call with culture results and make sure not positive for mrsa. Cold compresses on area. Stay away from all tylenol and motrin since recent CMV infection. F/U in 1 week if not improving.

## 2012-05-21 NOTE — Telephone Encounter (Signed)
Pt states that she has an area under breast that resembles abcess. Pt schedule to come in.

## 2012-05-21 NOTE — Telephone Encounter (Signed)
Seen today. Realize need yearly mammogram. Wait until abscess resolves but make patient aware that mammogram referral will be made.

## 2012-05-21 NOTE — Patient Instructions (Addendum)
Will call with culture results. Start doxy 1 tab twice a day.  Abscess An abscess is an infected area that contains a collection of pus and debris.It can occur in almost any part of the body. An abscess is also known as a furuncle or boil. CAUSES  An abscess occurs when tissue gets infected. This can occur from blockage of oil or sweat glands, infection of hair follicles, or a minor injury to the skin. As the body tries to fight the infection, pus collects in the area and creates pressure under the skin. This pressure causes pain. People with weakened immune systems have difficulty fighting infections and get certain abscesses more often.  SYMPTOMS Usually an abscess develops on the skin and becomes a painful mass that is red, warm, and tender. If the abscess forms under the skin, you may feel a moveable soft area under the skin. Some abscesses break open (rupture) on their own, but most will continue to get worse without care. The infection can spread deeper into the body and eventually into the bloodstream, causing you to feel ill.  DIAGNOSIS  Your caregiver will take your medical history and perform a physical exam. A sample of fluid may also be taken from the abscess to determine what is causing your infection. TREATMENT  Your caregiver may prescribe antibiotic medicines to fight the infection. However, taking antibiotics alone usually does not cure an abscess. Your caregiver may need to make a small cut (incision) in the abscess to drain the pus. In some cases, gauze is packed into the abscess to reduce pain and to continue draining the area. HOME CARE INSTRUCTIONS   Only take over-the-counter or prescription medicines for pain, discomfort, or fever as directed by your caregiver.  If you were prescribed antibiotics, take them as directed. Finish them even if you start to feel better.  If gauze is used, follow your caregiver's directions for changing the gauze.  To avoid spreading the  infection:  Keep your draining abscess covered with a bandage.  Wash your hands well.  Do not share personal care items, towels, or whirlpools with others.  Avoid skin contact with others.  Keep your skin and clothes clean around the abscess.  Keep all follow-up appointments as directed by your caregiver. SEEK MEDICAL CARE IF:   You have increased pain, swelling, redness, fluid drainage, or bleeding.  You have muscle aches, chills, or a general ill feeling.  You have a fever. MAKE SURE YOU:   Understand these instructions.  Will watch your condition.  Will get help right away if you are not doing well or get worse. Document Released: 05/14/2005 Document Revised: 02/03/2012 Document Reviewed: 10/17/2011 [facility] Patient Information 2013 [place], [place].

## 2012-05-24 NOTE — Telephone Encounter (Signed)
Pt aware.

## 2012-05-25 ENCOUNTER — Encounter: Payer: Self-pay | Admitting: Family Medicine

## 2012-05-25 ENCOUNTER — Ambulatory Visit (INDEPENDENT_AMBULATORY_CARE_PROVIDER_SITE_OTHER): Payer: PRIVATE HEALTH INSURANCE | Admitting: Family Medicine

## 2012-05-25 VITALS — BP 117/71 | HR 74 | Wt 141.0 lb

## 2012-05-25 DIAGNOSIS — R945 Abnormal results of liver function studies: Secondary | ICD-10-CM

## 2012-05-25 DIAGNOSIS — N61 Mastitis without abscess: Secondary | ICD-10-CM

## 2012-05-25 DIAGNOSIS — B259 Cytomegaloviral disease, unspecified: Secondary | ICD-10-CM

## 2012-05-25 DIAGNOSIS — B251 Cytomegaloviral hepatitis: Secondary | ICD-10-CM

## 2012-05-25 DIAGNOSIS — N611 Abscess of the breast and nipple: Secondary | ICD-10-CM

## 2012-05-25 DIAGNOSIS — K769 Liver disease, unspecified: Secondary | ICD-10-CM

## 2012-05-25 DIAGNOSIS — B199 Unspecified viral hepatitis without hepatic coma: Secondary | ICD-10-CM

## 2012-05-25 LAB — WOUND CULTURE

## 2012-05-25 NOTE — Progress Notes (Signed)
  Subjective:    Patient ID: Jessica Mullen, female    DOB: Apr 26, 1961, 51 y.o.   MRN: [id number]  HPI She is now back to working full days. Slowly  Getting there. Was up late last night so tired today. She is still tired overall.  Apeetite is up but back to normal.  Was able to work total of 3 miles yesterday at work.     She is also currently on doxycycline and Bactrim. She was diagnosed with an abscess on her right inner breast. She saw our PA [facility]. She says it's healing well and seems to be getting a little bit better. She was originally placed on doxycycline and then Dr. colon who she works with had recommend that she start Bactrim so she has been taking both. She's getting a little bit of GI upset but no actual diarrhea.  Review of Systems     Objective:   Physical Exam  Constitutional: She is oriented to person, place, and time. She appears well-developed and well-nourished.  HENT:  Head: Normocephalic and atraumatic.  Cardiovascular: Normal rate, regular rhythm and normal heart sounds.   Pulmonary/Chest: Effort normal and breath sounds normal.  Abdominal: Soft. Bowel sounds are normal. She exhibits no distension and no mass. There is tenderness. There is no rebound and no guarding.       Mild epigastric tenderness.  Neurological: She is alert and oriented to person, place, and time.  Skin: Skin is warm and dry.  Psychiatric: She has a normal mood and affect. Her behavior is normal.    She has an approximately 1 cm erythematous abscess, right inner breast. Appears to be healing well. There is a scab in the center. No exudate or drainage.      Assessment & Plan:  CMV Hepatitis - she is improving significantly which is fantastic. She is back to full days even if she is still very tired. She's been able to do it. I would like to recheck her liver enzymes as well as her CBC with differential in about 2 weeks to make sure that her blood levels go back to normal completely.  Followup as needed at this point time.  Abscess on breast-she's doing well. Her culture came back negative for MRSA which was concerning because she does work in a hospital setting and she was also hospitalized for 5 days. I recommended she take either the doxycycline or Bactrim to complete. She certainly does not need to take those. Keep an eye on the wound she feels it's not healing arts getting larger or worse or becomes painful then please call us back. Right now appears to be healing well.

## 2012-06-14 LAB — CBC WITH DIFFERENTIAL/PLATELET
HCT: 38.3 % (ref 36.0–46.0)
Hemoglobin: 13.2 g/dL (ref 12.0–15.0)
Lymphocytes Relative: 48 % — ABNORMAL HIGH (ref 12–46)
Lymphs Abs: 3.7 10*3/uL (ref 0.7–4.0)
MCHC: 34.5 g/dL (ref 30.0–36.0)
Monocytes Absolute: 0.4 10*3/uL (ref 0.1–1.0)
Monocytes Relative: 5 % (ref 3–12)
Neutro Abs: 3.4 10*3/uL (ref 1.7–7.7)
RBC: 4.38 MIL/uL (ref 3.87–5.11)
WBC: 7.6 10*3/uL (ref 4.0–10.5)

## 2012-06-15 ENCOUNTER — Other Ambulatory Visit: Payer: Self-pay | Admitting: Family Medicine

## 2012-06-15 ENCOUNTER — Telehealth: Payer: Self-pay | Admitting: *Deleted

## 2012-06-15 LAB — HEPATIC FUNCTION PANEL
ALT: 15 U/L (ref 0–35)
AST: 18 U/L (ref 0–37)
Alkaline Phosphatase: 58 U/L (ref 39–117)
Bilirubin, Direct: 0.1 mg/dL (ref 0.0–0.3)
Indirect Bilirubin: 0.5 mg/dL (ref 0.0–0.9)
Total Bilirubin: 0.6 mg/dL (ref 0.3–1.2)

## 2012-06-15 NOTE — Telephone Encounter (Signed)
Message copied by Florestine Avers on Tue Jun 15, 2012 10:06 AM ------      Message from: Nani Gasser D      Created: Tue Jun 15, 2012  7:38 AM       Call patient: Lymphocyte are trending back down to normal. This looks great.

## 2012-06-15 NOTE — Telephone Encounter (Signed)
Pt is going today to have her Hep Panel drawn as [facility] downstairs did not draw this yesterday only done the CBC. She wanted to know once liver enzyme results come back can she get her teeth cleaned and not hurt the hygenist who will be cleaning her teeth. Also is there anything else she can take other than honey and cinnamon for her cold. Also said she had a 3D Echo done while in hospital and wanted to know if you could look at it and let her know whethter she needs to followup with cardiologist or not

## 2012-06-15 NOTE — Telephone Encounter (Signed)
Left message on voice mail that labs are looking better/ per message

## 2012-06-16 NOTE — Telephone Encounter (Signed)
Please call back and let her know that I did look at the echocardiogram results that were done at the hospital. It actually looked very normal. I see no need for any further workup or followup. Liver enzymes are back down to normal, which is fantastic. We can recheck these in 6 months just to make sure that they stay in the normal range.. It's okay for her to use a Tylenol product for her cold she would like. Okay to continue the honey and cinnamon which is helpful for a sore throat.

## 2012-06-17 NOTE — Telephone Encounter (Signed)
Pt notified of MD instructions.Spoke with pt yesterday and informed her to go ahead if needed with tylenol cold for her cold symptoms

## 2012-08-03 ENCOUNTER — Encounter: Payer: Self-pay | Admitting: Family Medicine

## 2012-08-03 ENCOUNTER — Ambulatory Visit (INDEPENDENT_AMBULATORY_CARE_PROVIDER_SITE_OTHER): Payer: PRIVATE HEALTH INSURANCE | Admitting: Family Medicine

## 2012-08-03 ENCOUNTER — Telehealth: Payer: Self-pay | Admitting: Family Medicine

## 2012-08-03 VITALS — BP 122/78 | HR 68 | Temp 97.6°F | Wt 149.0 lb

## 2012-08-03 DIAGNOSIS — B259 Cytomegaloviral disease, unspecified: Secondary | ICD-10-CM

## 2012-08-03 DIAGNOSIS — R5381 Other malaise: Secondary | ICD-10-CM

## 2012-08-03 DIAGNOSIS — B251 Cytomegaloviral hepatitis: Secondary | ICD-10-CM

## 2012-08-03 DIAGNOSIS — R109 Unspecified abdominal pain: Secondary | ICD-10-CM

## 2012-08-03 DIAGNOSIS — R5383 Other fatigue: Secondary | ICD-10-CM

## 2012-08-03 DIAGNOSIS — B199 Unspecified viral hepatitis without hepatic coma: Secondary | ICD-10-CM

## 2012-08-03 LAB — CBC WITH DIFFERENTIAL/PLATELET
Basophils Absolute: 0 10*3/uL (ref 0.0–0.1)
Basophils Relative: 0 % (ref 0–1)
Eosinophils Absolute: 0 10*3/uL (ref 0.0–0.7)
Eosinophils Relative: 1 % (ref 0–5)
Lymphs Abs: 2.3 10*3/uL (ref 0.7–4.0)
MCH: 29.8 pg (ref 26.0–34.0)
MCV: 90.5 fL (ref 78.0–100.0)
Neutrophils Relative %: 52 % (ref 43–77)
Platelets: 212 10*3/uL (ref 150–400)
RBC: 4.73 MIL/uL (ref 3.87–5.11)
RDW: 12.8 % (ref 11.5–15.5)

## 2012-08-03 LAB — POCT URINALYSIS DIPSTICK
Bilirubin, UA: NEGATIVE
Ketones, UA: NEGATIVE
Protein, UA: NEGATIVE
Spec Grav, UA: 1.02
pH, UA: 6.5

## 2012-08-03 LAB — COMPLETE METABOLIC PANEL WITH GFR
ALT: 17 U/L (ref 0–35)
CO2: 30 mEq/L (ref 19–32)
Calcium: 9.7 mg/dL (ref 8.4–10.5)
Chloride: 101 mEq/L (ref 96–112)
Creat: 0.72 mg/dL (ref 0.50–1.10)
GFR, Est African American: >89 mL/min
GFR, Est Non African American: >89 mL/min
Glucose, Bld: 83 mg/dL (ref 70–99)
Sodium: 141 mEq/L (ref 135–145)
Total Bilirubin: 0.4 mg/dL (ref 0.3–1.2)
Total Protein: 6.8 g/dL (ref 6.0–8.3)

## 2012-08-03 LAB — TSH: TSH: 3.038 u[IU]/mL (ref 0.350–4.500)

## 2012-08-03 NOTE — Progress Notes (Signed)
CC: Jessica Mullen is a 51 y.o. female is here for Fatigue   Subjective: HPI:  1 week of fatigue, subjective fevers, small nonproductive cough nasal congestion, return of poor appetite, mild burning left lower back pain. Fatigue is described as constant sleepiness, sleeping through morning alarm, not feeling well rested in the morning, feeling as if she taken at any moment. Left lower back pain is burning, nothing in particular makes it better or worse, she's unsure if it's associated with bowel or bladder habits but states no r irregularities in her bowel regimen. She's unsure of whether or not her urine is more malodorous. She has some epigastric pain that is not influenced by diet or movements, it is mild in severity and nonradiating. It has been present ever since she was diagnosed with CMV hepatitis in mid September.    Around late November she began to get back in her normal routine of working out, jogging 2 miles a day, and getting back to work. Symptoms of fatigue, appetite loss, mild nausea were completely gone but have now returned for no particular reason she can put her finger on.  She denies chills, night sweats, right upper quadrant pain, skin or scleral discoloration, rashes, vomiting, diarrhea, pelvic pain, shortness of breath, wheezing, sore throat, left upper quadrant pain, bleeding or bruising issues, focal joint or muscle pain.    Review Of Systems Outlined In HPI  Past Medical History  Diagnosis Date  . Endometriosis   . MVP (mitral valve prolapse)   . Environmental allergies   . CMV hepatitis      Family History  Problem Relation Age of Onset  . Hypertension Mother   . Diabetes Father   . Cancer Father     lung, leukemia  . Cancer Maternal Grandfather     COLON     History  Substance Use Topics  . Smoking status: Never Smoker   . Smokeless tobacco: Not on file  . Alcohol Use: No     Objective: Filed Vitals:   08/03/12 0909  BP: 122/78  Pulse: 68   Temp: 97.6 F (36.4 C)    General: Alert and Oriented, No Acute Distress HEENT: Pupils equal, round, reactive to light. Conjunctivae clear.  External ears unremarkable, canals clear with intact TMs with appropriate landmarks.  Middle ear appears open without effusion. Pink inferior turbinates.  Moist mucous membranes, pharynx without inflammation nor lesions.  Neck supple without palpable lymphadenopathy nor abnormal masses. Lungs: Clear to auscultation bilaterally, no wheezing/ronchi/rales.  Comfortable work of breathing. Good air movement. Cardiac: Regular rate and rhythm. Normal S1/S2.  No murmurs, rubs, nor gallops.   Abdomen: Trace right lower quadrant pain present only with deep palpation with no rebound, mild epigastric soreness with deep palpation and no rebound. No palpable masses, no bruits, spleen is nontender to palpation, negative Murphy's sign Extremities: No peripheral edema.  Strong peripheral pulses.  Mental Status: No depression, anxiety, nor agitation. Skin: Warm and dry. Trace scattered petechiae on the anterior shins, no rashes elsewhere  Assessment & Plan: Jessica Mullen was seen today for fatigue.  Diagnoses and associated orders for this visit:  Fatigue - COMPLETE METABOLIC PANEL WITH GFR - B12 - CBC w/Diff - TSH  Flank pain - POCT urinalysis dipstick  Cmv hepatitis - COMPLETE METABOLIC PANEL WITH GFR - CBC w/Diff  Fatigue: Ruling out B12, anemia, hypothyroid, or electrolyte abnormalities. White count with differential to rule out infectious etiology History of CMV hepatitis: Repeat CMP to ensure liver function  has not been compromised. Flank pain: Urinalysis not suggestive of urinary tract infection or kidney stone Patient will be called when labs returned this afternoon  Return for Will cal lyou with results.

## 2012-08-03 NOTE — Telephone Encounter (Signed)
Pt.notified

## 2012-08-03 NOTE — Telephone Encounter (Signed)
Jessica Mullen, Will you please let Jessica Mullen know that her CBC and CMP were reassuring without abnormalities.  No liver inflammation nor dysfunction, no elevated white blood count or abnormal ratios of immune cells to suggest an infection.  I'm awaiting results from the thyroid and B12 and expect these tomorrow but didn't want her to be too worried about an infectious issue over night. Thank you.

## 2012-10-02 ENCOUNTER — Other Ambulatory Visit: Payer: Self-pay

## 2013-01-25 ENCOUNTER — Telehealth: Payer: Self-pay | Admitting: *Deleted

## 2013-01-25 NOTE — Telephone Encounter (Signed)
She needs an appointment.

## 2013-01-25 NOTE — Telephone Encounter (Signed)
Pt calls & states that she was diagnosed with CMV last sept & she would like to have labs checked again.  She states that she is starting to experience some of the same symptoms again.  Please advise on what labs need to be ordered.

## 2013-01-26 NOTE — Telephone Encounter (Signed)
Pt notified needs to make appointment via VM. Barry Dienes, LPN

## 2013-03-09 ENCOUNTER — Telehealth: Payer: Self-pay

## 2013-03-09 DIAGNOSIS — N63 Unspecified lump in unspecified breast: Secondary | ICD-10-CM

## 2013-03-09 NOTE — Telephone Encounter (Signed)
Jessica Mullen called about getting a diagnostic, bilateral, mammogram. At [facility] due to lump in left breast.

## 2013-03-09 NOTE — Telephone Encounter (Signed)
OK to place order.

## 2013-03-09 NOTE — Telephone Encounter (Signed)
Patient called and left a message on nurse line asking for a return call.   Returned Call: Left message asking patient to call back.  

## 2013-03-10 NOTE — Telephone Encounter (Signed)
Orders placed.Jessica Mullen  

## 2013-03-15 ENCOUNTER — Ambulatory Visit (INDEPENDENT_AMBULATORY_CARE_PROVIDER_SITE_OTHER): Payer: PRIVATE HEALTH INSURANCE | Admitting: Sports Medicine

## 2013-03-15 ENCOUNTER — Encounter: Payer: Self-pay | Admitting: Sports Medicine

## 2013-03-15 VITALS — BP 118/75 | HR 71 | Wt 150.0 lb

## 2013-03-15 DIAGNOSIS — H6123 Impacted cerumen, bilateral: Secondary | ICD-10-CM

## 2013-03-15 DIAGNOSIS — B349 Viral infection, unspecified: Secondary | ICD-10-CM | POA: Insufficient documentation

## 2013-03-15 DIAGNOSIS — H6120 Impacted cerumen, unspecified ear: Secondary | ICD-10-CM

## 2013-03-15 DIAGNOSIS — B9789 Other viral agents as the cause of diseases classified elsewhere: Secondary | ICD-10-CM

## 2013-03-15 MED ORDER — BENZONATATE 200 MG PO CAPS: 200.0000 mg | ORAL_CAPSULE | Freq: Three times a day (TID) | ORAL | Status: DC | PRN

## 2013-03-15 MED ORDER — HYDROCOD POLST-CHLORPHEN POLST 10-8 MG/5ML PO LQCR: 5.0000 mL | Freq: Two times a day (BID) | ORAL | Status: DC | PRN

## 2013-03-15 MED ORDER — MELOXICAM 15 MG PO TABS: ORAL_TABLET | ORAL | Status: DC

## 2013-03-15 MED ORDER — CARBAMIDE PEROXIDE 6.5 % OT SOLN: 10.0000 [drp] | Freq: Two times a day (BID) | OTIC | Status: DC

## 2013-03-15 NOTE — Assessment & Plan Note (Signed)
Some pleurisy. Cough his predominant symptom. Tussionex, Mobic, Tessalon Perles. Return if no better in 2 weeks.

## 2013-03-15 NOTE — Progress Notes (Signed)
  Subjective:    CC: Cough  HPI: Jessica Mullen is a pleasant 52 year old female who presents with complaint of cough. The patient has experienced 2-3 weeks of 2/10 headache that she characterizes as nagging. About 10 days ago she began to experience sore throat and productive cough. She believes these symptoms have worsened and she now experiences body aches, pleuritic chest pain, nausea, chills and fatigue. She has tried one dose of an allergy medicine with tylenol and experienced some improvement in the cough and body aches. She is training for a half marathon and does not know if the fatigue and body aches have to do with this training. She denies rhinorrhea, ear pain, vision changes/watery eyes, back pain, rash, diarrhea, vomiting, leg pain/swelling and fever.   Past medical history, Surgical history, Family history not pertinant except as noted below, Social history, Allergies, and medications have been entered into the medical record, reviewed, and no changes needed.   Review of Systems: No fevers, night sweats, weight loss, or shortness of breath.   Objective:    General: Well Developed, well nourished, and in no acute distress.  Neuro: Alert and oriented x3, extra-ocular muscles intact, sensation grossly intact.  HEENT: Normocephalic, atraumatic, pupils equal round reactive to light, neck supple, no masses, no lymphadenopathy, thyroid nonpalpable. Unable to visualize TMs secondary to cerumen Skin: Warm and dry, no rashes. Cardiac: Regular rate and rhythm, no murmurs rubs or gallops, no lower extremity edema.  Respiratory: Clear to auscultation bilaterally. Not using accessory muscles, speaking in full sentences.  Indication: Cerumen impaction of the ear(s) left and right. Medical necessity statement: On physical examination, cerumen impairs clinically significant portions of the external auditory canal, and tympanic membrane. Noted obstructive, copious cerumen that cannot be removed  without magnification and instrumentations requiring physician skills Consent: Discussed benefits and risks of procedure and verbal consent obtained Procedure: Patient was prepped for the procedure. Utilized an otoscope to assess and take note of the ear canal, the tympanic membrane, and the presence, amount, and placement of the cerumen. A soft plastic curette was utilized to remove cerumen.  Post procedure examination: shows cerumen was completely removed. Patient tolerated procedure well. The patient is made aware that they may experience temporary vertigo, temporary hearing loss, and temporary discomfort. If these symptom last for more than 24 hours to call the clinic or proceed to the ED. Cerumen was fully removed from the right ear and partially removed from the left ear.  Impression and Recommendations:   Viral Syndrome Prescribing Mobic, tessalon perles, Tussionex  Ceruminosis is noted.  Wax is removed by syringing and manual debridement. Instructions for home care to prevent wax buildup are given. Follow up as needed.  I spent 40 minutes with this patient, greater than 50% was face-to-face time counseling regarding cerumen impaction and viral infection.

## 2013-03-15 NOTE — Assessment & Plan Note (Signed)
Bilateral instrumentation, and irrigation.

## 2013-03-29 ENCOUNTER — Ambulatory Visit: Payer: PRIVATE HEALTH INSURANCE | Admitting: Sports Medicine

## 2013-03-31 ENCOUNTER — Ambulatory Visit
Admission: RE | Admit: 2013-03-31 | Discharge: 2013-03-31 | Disposition: A | Discharge status: Home / Self Care | Payer: PRIVATE HEALTH INSURANCE | Source: Ambulatory Visit | Attending: Family Medicine | Admitting: Family Medicine

## 2013-03-31 DIAGNOSIS — N63 Unspecified lump in unspecified breast: Secondary | ICD-10-CM

## 2013-08-15 ENCOUNTER — Telehealth: Payer: Self-pay | Admitting: *Deleted

## 2013-08-15 MED ORDER — VALACYCLOVIR HCL 500 MG PO TABS: 500.0000 mg | ORAL_TABLET | Freq: Two times a day (BID) | ORAL | Status: DC

## 2013-08-15 NOTE — Telephone Encounter (Signed)
Pt calling c/o HSV outbreak on buttocks, I didn't see where Rx has been sent in epic. Pt has not had outbreak in years requesting valtrex Rx. Okay to send? Pt is overdue for annual is going to schedule. Please advise

## 2013-08-15 NOTE — Telephone Encounter (Signed)
rx sent

## 2013-08-15 NOTE — Telephone Encounter (Signed)
Valtrex 500 mg twice a day #20

## 2013-08-19 ENCOUNTER — Encounter: Payer: Self-pay | Admitting: Family Medicine

## 2013-08-19 ENCOUNTER — Ambulatory Visit (INDEPENDENT_AMBULATORY_CARE_PROVIDER_SITE_OTHER): Payer: PRIVATE HEALTH INSURANCE | Admitting: Family Medicine

## 2013-08-19 VITALS — BP 127/80 | HR 100 | Temp 100.2°F | Ht 63.0 in | Wt 156.0 lb

## 2013-08-19 DIAGNOSIS — J029 Acute pharyngitis, unspecified: Secondary | ICD-10-CM

## 2013-08-19 DIAGNOSIS — R509 Fever, unspecified: Secondary | ICD-10-CM

## 2013-08-19 DIAGNOSIS — J069 Acute upper respiratory infection, unspecified: Secondary | ICD-10-CM

## 2013-08-19 LAB — POCT RAPID STREP A (OFFICE): RAPID STREP A SCREEN: NEGATIVE

## 2013-08-19 LAB — POC INFLUENZA A&B (BINAX/QUICKVUE)
INFLUENZA A, POC: NEGATIVE
INFLUENZA B, POC: NEGATIVE

## 2013-08-19 NOTE — Progress Notes (Signed)
   Subjective:    Patient ID: Jessica SacramentoKathryn A Mullen, female    DOB: 10-07-1960, 53 y.o.   MRN: [id number]  HPI ST x 5 days.  Then yesterday got worse.  Has had cough, congestion.  + exposure to the flu as well.  + fever.  Used old cough syrup from 2009 that she used, bc cough was keeping her up at night.  Fever the last 2 days.  + bodyaches. No ear pain. No GI upset. +fatigued.     Review of Systems     Objective:   Physical Exam  Constitutional: She is oriented to person, place, and time. She appears well-developed and well-nourished.  HENT:  Head: Normocephalic and atraumatic.  Right Ear: External ear normal.  Left Ear: External ear normal.  Nose: Nose normal.  Mouth/Throat: Oropharynx is clear and moist.  TMs and canals are clear.   Eyes: Conjunctivae and EOM are normal. Pupils are equal, round, and reactive to light.  Neck: Neck supple. No thyromegaly present.  Cardiovascular: Normal rate, regular rhythm and normal heart sounds.   Pulmonary/Chest: Effort normal and breath sounds normal. She has no wheezes.  Lymphadenopathy:    She has no cervical adenopathy.  Neurological: She is alert and oriented to person, place, and time.  Skin: Skin is warm and dry.  Psychiatric: She has a normal mood and affect. Her behavior is normal.          Assessment & Plan:  URI- strep neg.  flu test is negative as well. Likely viral. Recommend symptomatic care. Work note given. If she's not feeling better or at least afebrile after the weekend then please give us a call back. Continue symptomatic care. Handout provided.

## 2013-08-22 ENCOUNTER — Encounter: Payer: Self-pay | Admitting: *Deleted

## 2013-08-22 ENCOUNTER — Telehealth: Payer: Self-pay | Admitting: *Deleted

## 2013-08-22 NOTE — Telephone Encounter (Signed)
Pt called and stated that her return date was incorrect. Corrections made and faxed to [phone].Loralee PacasBarkley, [place] Crest[place]etta

## 2013-10-18 ENCOUNTER — Encounter (HOSPITAL_BASED_OUTPATIENT_CLINIC_OR_DEPARTMENT_OTHER): Payer: Self-pay | Admitting: *Deleted

## 2013-10-19 ENCOUNTER — Encounter (HOSPITAL_BASED_OUTPATIENT_CLINIC_OR_DEPARTMENT_OTHER): Payer: Self-pay | Admitting: *Deleted

## 2013-10-19 NOTE — Progress Notes (Signed)
NPO AFTER MN WITH EXCEPTION CLEAR LIQUIDS UNTIL 0930. NEEDS HG. ASK MDA IF URINE PREG NEEDED, NO PERIODS SINCE 2008 DUE TO LUPRON INJECTIONS AND HUSBAND AS VASECTOMY.  MAY TAKE TYLENOL IF NEEDED DOS W/ SIPS OF WATER.

## 2013-10-21 ENCOUNTER — Encounter (HOSPITAL_BASED_OUTPATIENT_CLINIC_OR_DEPARTMENT_OTHER): Payer: PRIVATE HEALTH INSURANCE | Admitting: Anesthesiology

## 2013-10-21 ENCOUNTER — Encounter (HOSPITAL_BASED_OUTPATIENT_CLINIC_OR_DEPARTMENT_OTHER): Payer: Self-pay | Admitting: *Deleted

## 2013-10-21 ENCOUNTER — Ambulatory Visit (HOSPITAL_BASED_OUTPATIENT_CLINIC_OR_DEPARTMENT_OTHER): Payer: PRIVATE HEALTH INSURANCE | Admitting: Anesthesiology

## 2013-10-21 ENCOUNTER — Encounter (HOSPITAL_BASED_OUTPATIENT_CLINIC_OR_DEPARTMENT_OTHER)
Admission: RE | Disposition: A | Discharge status: Home / Self Care | Payer: Self-pay | Source: Ambulatory Visit | Attending: Orthopedic Surgery

## 2013-10-21 ENCOUNTER — Ambulatory Visit (HOSPITAL_BASED_OUTPATIENT_CLINIC_OR_DEPARTMENT_OTHER)
Admission: RE | Admit: 2013-10-21 | Discharge: 2013-10-21 | Disposition: A | Discharge status: Home / Self Care | Payer: PRIVATE HEALTH INSURANCE | Source: Ambulatory Visit | Attending: Orthopedic Surgery | Admitting: Orthopedic Surgery

## 2013-10-21 DIAGNOSIS — Z79899 Other long term (current) drug therapy: Secondary | ICD-10-CM | POA: Insufficient documentation

## 2013-10-21 DIAGNOSIS — M1710 Unilateral primary osteoarthritis, unspecified knee: Secondary | ICD-10-CM | POA: Insufficient documentation

## 2013-10-21 DIAGNOSIS — IMO0002 Reserved for concepts with insufficient information to code with codable children: Secondary | ICD-10-CM | POA: Insufficient documentation

## 2013-10-21 DIAGNOSIS — X500XXA Overexertion from strenuous movement or load, initial encounter: Secondary | ICD-10-CM | POA: Insufficient documentation

## 2013-10-21 DIAGNOSIS — M2240 Chondromalacia patellae, unspecified knee: Secondary | ICD-10-CM | POA: Insufficient documentation

## 2013-10-21 DIAGNOSIS — Z791 Long term (current) use of non-steroidal anti-inflammatories (NSAID): Secondary | ICD-10-CM | POA: Insufficient documentation

## 2013-10-21 HISTORY — PX: KNEE ARTHROSCOPY: SHX127

## 2013-10-21 HISTORY — DX: Osteoarthritis of knee, unspecified: M17.9

## 2013-10-21 HISTORY — DX: Unspecified tear of unspecified meniscus, current injury, left knee, initial encounter: S83.207A

## 2013-10-21 HISTORY — DX: Unilateral primary osteoarthritis, unspecified knee: M17.10

## 2013-10-21 HISTORY — DX: Presence of spectacles and contact lenses: Z97.3

## 2013-10-21 LAB — POCT PREGNANCY, URINE: PREG TEST UR: NEGATIVE

## 2013-10-21 LAB — POCT HEMOGLOBIN-HEMACUE: Hemoglobin: 14 g/dL (ref 12.0–15.0)

## 2013-10-21 SURGERY — ARTHROSCOPY, KNEE
Anesthesia: General | Site: Knee | Laterality: Left

## 2013-10-21 MED ORDER — DEXAMETHASONE SODIUM PHOSPHATE 4 MG/ML IJ SOLN
INTRAMUSCULAR | Status: DC | PRN
  Administered 2013-10-21: 10 mg via INTRAVENOUS

## 2013-10-21 MED ORDER — ONDANSETRON HCL 4 MG/2ML IJ SOLN
INTRAMUSCULAR | Status: DC | PRN
  Administered 2013-10-21: 4 mg via INTRAVENOUS

## 2013-10-21 MED ORDER — LIDOCAINE-EPINEPHRINE (PF) 1 %-1:200000 IJ SOLN
INTRAMUSCULAR | Status: DC | PRN
  Administered 2013-10-21: 30 mL

## 2013-10-21 MED ORDER — MIDAZOLAM HCL 2 MG/2ML IJ SOLN
INTRAMUSCULAR | Status: AC
  Filled 2013-10-21: qty 2

## 2013-10-21 MED ORDER — PROMETHAZINE HCL 25 MG/ML IJ SOLN
6.2500 mg | INTRAMUSCULAR | Status: DC | PRN
  Filled 2013-10-21: qty 1

## 2013-10-21 MED ORDER — OXYCODONE HCL 5 MG PO TABS
5.0000 mg | ORAL_TABLET | Freq: Once | ORAL | Status: DC | PRN
  Filled 2013-10-21: qty 1

## 2013-10-21 MED ORDER — OXYCODONE HCL 5 MG/5ML PO SOLN
5.0000 mg | Freq: Once | ORAL | Status: DC | PRN
  Filled 2013-10-21: qty 5

## 2013-10-21 MED ORDER — ACETAMINOPHEN 10 MG/ML IV SOLN
INTRAVENOUS | Status: DC | PRN
  Administered 2013-10-21: 1000 mg via INTRAVENOUS

## 2013-10-21 MED ORDER — LACTATED RINGERS IV SOLN
INTRAVENOUS | Status: DC
  Administered 2013-10-21: 14:00:00 via INTRAVENOUS
  Filled 2013-10-21: qty 1000

## 2013-10-21 MED ORDER — HYDROMORPHONE HCL PF 1 MG/ML IJ SOLN
0.2500 mg | INTRAMUSCULAR | Status: DC | PRN
  Filled 2013-10-21: qty 1

## 2013-10-21 MED ORDER — MIDAZOLAM HCL 5 MG/5ML IJ SOLN
INTRAMUSCULAR | Status: DC | PRN
  Administered 2013-10-21: 2 mg via INTRAVENOUS

## 2013-10-21 MED ORDER — LIDOCAINE HCL (CARDIAC) 20 MG/ML IV SOLN
INTRAVENOUS | Status: DC | PRN
  Administered 2013-10-21: 100 mg via INTRAVENOUS

## 2013-10-21 MED ORDER — BUPIVACAINE-EPINEPHRINE 0.25% -1:200000 IJ SOLN
INTRAMUSCULAR | Status: DC | PRN
  Administered 2013-10-21: 30 mL

## 2013-10-21 MED ORDER — MEPERIDINE HCL 25 MG/ML IJ SOLN
6.2500 mg | INTRAMUSCULAR | Status: DC | PRN
  Filled 2013-10-21: qty 1

## 2013-10-21 MED ORDER — CEFAZOLIN SODIUM-DEXTROSE 2-3 GM-% IV SOLR
2.0000 g | INTRAVENOUS | Status: AC
  Administered 2013-10-21: 2 g via INTRAVENOUS
  Filled 2013-10-21: qty 50

## 2013-10-21 MED ORDER — FENTANYL CITRATE 0.05 MG/ML IJ SOLN
INTRAMUSCULAR | Status: DC | PRN
  Administered 2013-10-21 (×2): 50 ug via INTRAVENOUS

## 2013-10-21 MED ORDER — CHLORHEXIDINE GLUCONATE 4 % EX LIQD
60.0000 mL | Freq: Once | CUTANEOUS | Status: AC
  Administered 2013-10-21: 4 via TOPICAL
  Filled 2013-10-21: qty 60

## 2013-10-21 MED ORDER — FENTANYL CITRATE 0.05 MG/ML IJ SOLN
INTRAMUSCULAR | Status: AC
  Filled 2013-10-21: qty 4

## 2013-10-21 MED ORDER — PROPOFOL 10 MG/ML IV BOLUS
INTRAVENOUS | Status: DC | PRN
  Administered 2013-10-21: 200 mg via INTRAVENOUS

## 2013-10-21 MED ORDER — SODIUM CHLORIDE 0.9 % IR SOLN
Status: DC | PRN
  Administered 2013-10-21: 6000 mL

## 2013-10-21 SURGICAL SUPPLY — 36 items
BANDAGE ELASTIC 6 VELCRO ST LF (GAUZE/BANDAGES/DRESSINGS) ×2 IMPLANT
BANDAGE GAUZE ELAST BULKY 4 IN (GAUZE/BANDAGES/DRESSINGS) ×1 IMPLANT
BLADE 4.2CUDA (BLADE) IMPLANT
BLADE CUDA SHAVER 3.5 (BLADE) ×2 IMPLANT
CANISTER SUCT LVC 12 LTR MEDI- (MISCELLANEOUS) ×2 IMPLANT
CANISTER SUCTION 2500CC (MISCELLANEOUS) ×2 IMPLANT
CLOTH BEACON ORANGE TIMEOUT ST (SAFETY) ×2 IMPLANT
DRAPE ARTHROSCOPY W/POUCH 114 (DRAPES) ×2 IMPLANT
DRSG EMULSION OIL 3X3 NADH (GAUZE/BANDAGES/DRESSINGS) ×2 IMPLANT
DURAPREP 26ML APPLICATOR (WOUND CARE) ×2 IMPLANT
ELECT MENISCUS 165MM 90D (ELECTRODE) IMPLANT
ELECT REM PT RETURN 9FT ADLT (ELECTROSURGICAL)
ELECTRODE REM PT RTRN 9FT ADLT (ELECTROSURGICAL) IMPLANT
GAUZE XEROFORM 1X8 LF (GAUZE/BANDAGES/DRESSINGS) ×1 IMPLANT
GLOVE BIO SURGEON STRL SZ7.5 (GLOVE) ×2 IMPLANT
GLOVE BIOGEL PI IND STRL 7.5 (GLOVE) ×2 IMPLANT
GLOVE BIOGEL PI INDICATOR 7.5 (GLOVE) ×2
GOWN PREVENTION PLUS LG XLONG (DISPOSABLE) ×2 IMPLANT
GOWN STRL REUS W/ TWL LRG LVL3 (GOWN DISPOSABLE) ×1 IMPLANT
GOWN STRL REUS W/TWL LRG LVL3 (GOWN DISPOSABLE) ×2
GOWN STRL REUS W/TWL XL LVL3 (GOWN DISPOSABLE) ×2 IMPLANT
KNEE WRAP E Z 3 GEL PACK (MISCELLANEOUS) ×2 IMPLANT
PACK ARTHROSCOPY DSU (CUSTOM PROCEDURE TRAY) ×2 IMPLANT
PACK BASIN DAY SURGERY FS (CUSTOM PROCEDURE TRAY) ×2 IMPLANT
PADDING CAST ABS 4INX4YD NS (CAST SUPPLIES) ×1
PADDING CAST ABS COTTON 4X4 ST (CAST SUPPLIES) ×1 IMPLANT
PENCIL BUTTON HOLSTER BLD 10FT (ELECTRODE) IMPLANT
SET ARTHROSCOPY TUBING (MISCELLANEOUS) ×2
SET ARTHROSCOPY TUBING LN (MISCELLANEOUS) ×1 IMPLANT
SPONGE GAUZE 4X4 12PLY (GAUZE/BANDAGES/DRESSINGS) ×2 IMPLANT
SPONGE GAUZE 4X4 12PLY STER LF (GAUZE/BANDAGES/DRESSINGS) ×1 IMPLANT
SUT ETHILON 4 0 PS 2 18 (SUTURE) ×2 IMPLANT
SYR 30ML LL (SYRINGE) ×2 IMPLANT
SYR CONTROL 10ML LL (SYRINGE) ×2 IMPLANT
TOWEL OR 17X24 6PK STRL BLUE (TOWEL DISPOSABLE) ×2 IMPLANT
WATER STERILE IRR 500ML POUR (IV SOLUTION) ×2 IMPLANT

## 2013-10-21 NOTE — Anesthesia Procedure Notes (Signed)
Procedure Name: LMA Insertion Date/Time: 10/21/2013 3:37 PM Performed by: Maris BergerENENNY, Titianna Loomis T Pre-anesthesia Checklist: Patient identified, Emergency Drugs available, Suction available and Patient being monitored Patient Re-evaluated:Patient Re-evaluated prior to inductionOxygen Delivery Method: Circle System Utilized Preoxygenation: Pre-oxygenation with 100% oxygen Intubation Type: IV induction Ventilation: Mask ventilation without difficulty LMA: LMA inserted LMA Size: 4.0 Number of attempts: 1 Airway Equipment and Method: bite block Placement Confirmation: positive ETCO2 Dental Injury: Teeth and Oropharynx as per pre-operative assessment

## 2013-10-21 NOTE — H&P (Signed)
  CC- Jessica SacramentoKathryn A Mullen is a 53 y.o. female who presents with left knee pain, medial meniscal tear.  HPI- . Knee Pain: Patient presents with knee pain involving the  left knee. Onset of the symptoms was several weeks ago. Inciting event: injured while doing lunges, squats, associated with running program. Current symptoms include giving out, locking, pain located medially along joint line and swelling. Pain is aggravated by lateral movements, pivoting and walking.  Patient has had no prior knee problems. Evaluation to date: plain films: normal and MRI: abnormal with evidence of medial meniscal tearing and patellofemoral chondromalcia. Treatment to date: avoidance of offending activity, corticosteroid injection which was not very effective, ice, prescription NSAIDS which are ineffective and rest.  Past Medical History  Diagnosis Date  . Endometriosis   . Environmental allergies   . Acute meniscal tear of left knee   . History of hepatitis     CMV  04-2012---  NOW RESOLVED  . OA (osteoarthritis) of knee     BILATERAL KNEE  . Wears glasses     Past Surgical History  Procedure Laterality Date  . Cesarean section  1984  &  1987  . Ureteral reimplantion Left AGE 86  . Exploratory laparotomy/  left salpingoophorectomy/  ablation endometriosis  04-20-2002  . Combined laparoscopy w/ hysteroscopy  10-31-2005    W/ BX AND ALBATION ENDOMETRIOSIS  . Transthoracic echocardiogram  05-09-2012    NORMAL STUDY/  EF 60-65%/  NO EVIDENCE MVP  . Cysto/  left ureteral stent placement  AS CHILD    Prior to Admission medications   Medication Sig Start Date End Date Taking? Authorizing Provider  acetaminophen (TYLENOL) 500 MG tablet Take 500 mg by mouth every 6 (six) hours as needed.   Yes Historical Provider, MD  meloxicam (MOBIC) 15 MG tablet Take 15 mg by mouth daily. 03/15/13  Yes Monica Bectonhomas J Thekkekandam, MD    antalgic gait, soft tissue tenderness over medial joint line, effusion, collateral ligaments  intact, normal ipsilateral hip exam, normal contralateral knee exam  Physical Examination: Mental status - alert, oriented to person, place, and time, normal mood, behavior, speech, dress, motor activity, and thought processes Chest - no tachypnea, retractions or cyanosis Heart - normal rate and regular rhythm Abdomen - soft, nontender, nondistended, no masses or organomegaly Neurological - alert, oriented, normal speech, no focal findings or movement disorder noted Musculoskeletal - as above findings describe Extremities - peripheral pulses normal, no pedal edema, no clubbing or cyanosis Skin - normal coloration and turgor, no rashes, no suspicious skin lesions noted   Asessment/Plan--- Left knee medial meniscal tear and grade II-III patellofemoral chondromalacia- -   Plan left knee arthroscopy with medial meniscal debridement and chondroplasty. Procedure risks and potential comps discussed with patient who elects to proceed. Goals are decreased pain and increased function with a high likelihood of achieving both

## 2013-10-21 NOTE — Anesthesia Preprocedure Evaluation (Addendum)
Anesthesia Evaluation  Patient identified by MRN, date of birth, ID band Patient awake    Reviewed: Allergy & Precautions, H&P , NPO status , Patient's Chart, lab work & pertinent test results  Airway Mallampati: II TM Distance: >3 FB Neck ROM: Full    Dental  (+) Dental Advisory Given   Pulmonary neg pulmonary ROS,  breath sounds clear to auscultation        Cardiovascular negative cardio ROS  Rhythm:Regular Rate:Normal  Echo 2013 Left ventricle: The cavity size was normal. Wall thickness was normal. Systolic function was normal. The estimated ejection fraction was in the range of 60% to 65%.   Neuro/Psych negative neurological ROS  negative psych ROS   GI/Hepatic negative GI ROS, Neg liver ROS,   Endo/Other  negative endocrine ROS  Renal/GU negative Renal ROS     Musculoskeletal negative musculoskeletal ROS (+)   Abdominal   Peds  Hematology negative hematology ROS (+)   Anesthesia Other Findings   Reproductive/Obstetrics negative OB ROS                         Anesthesia Physical Anesthesia Plan  ASA: II  Anesthesia Plan: General   Post-op Pain Management:    Induction: Intravenous  Airway Management Planned: LMA  Additional Equipment:   Intra-op Plan:   Post-operative Plan: Extubation in OR  Informed Consent: I have reviewed the patients History and Physical, chart, labs and discussed the procedure including the risks, benefits and alternatives for the proposed anesthesia with the patient or authorized representative who has indicated his/her understanding and acceptance.   Dental advisory given  Plan Discussed with: CRNA  Anesthesia Plan Comments:         Anesthesia Quick Evaluation

## 2013-10-21 NOTE — Transfer of Care (Signed)
Immediate Anesthesia Transfer of Care Note  Patient: Jessica SacramentoKathryn A Mullen  Procedure(s) Performed: Procedure(s): LEFT ARTHROSCOPY KNEE WITH DEBRIDEMENT (Left)  Patient Location: PACU  Anesthesia Type:General  Level of Consciousness: awake, alert  and oriented  Airway & Oxygen Therapy: Patient Spontanous Breathing and Patient connected to nasal cannula oxygen  Post-op Assessment: Report given to PACU RN  Post vital signs: Reviewed and stable  Complications: No apparent anesthesia complications

## 2013-10-21 NOTE — Discharge Instructions (Signed)
ARTHROSCOPIC KNEE SURGERY HOME CARE INSTRUCTIONS   PAIN You will be expected to have a moderate amount of pain in the affected knee for approximately two weeks.  However, the first two to four days will be the most severe in terms of the pain you will experience.  Prescriptions have been provided for you to take as needed for the pain.  The pain can be markedly reduced by using the ice/compressive bandage given.  Exchange the ice packs whenever they thaw.  During the night, keep the bandage on because it will still provide some compression for the swelling.  Also, keep the leg elevated on pillows above your heart, and this will help alleviate the pain and swelling.  MEDICATION Prescriptions have been provided to take as needed for pain. To prevent blood clots, take Aspirin 325mg  daily with a meal if not on a blood thinner and if no history of stomach ulcers.  ACTIVITY It is preferred that you stay on bedrest for approximately 24 hours.  However, you may go to the bathroom with help.  After this, you can start to be up and about progressively more.  Remember that the swelling may still increase after three to four days if you are up and doing too much.  You may put as much weight on the affected leg as pain will allow.  Use your crutches for comfort and safety.  However, as soon as you are able, you may discard the crutches and go without them.   DRESSING Keep the current dressing as dry as possible.  Two days after your surgery, you may remove the ice/compressive wrap, and surgical dressing.  You may now take a shower, but do not scrub the sounds directly with soap.  Let water rinse over these and gently wipe with your hand.  Reapply band-aids over the puncture wounds and more gauze if needed.  A slight amount of thin drainage can be normal at this time, and do not let it frighten you.  Reapply the ice/compressive wrap.  You may now repeat this every day each time you shower.  SYMPTOMS TO REPORT TO  YOUR DOCTOR  -Extreme pain.  -Extreme swelling.  -Temperature above 101 degrees that does not come down with acetaminophen     (Tylenol).  -Any changes in the feeling, color or movement of your toes.  -Extreme redness, heat, swelling or drainage at your incision  EXERCISE It is preferred that you begin to exercise on the day of your surgery.  Straight leg raises and short arc quads should be begun the afternoon or evening of surgery and continued until you come back for your follow-up appointment.   Attached is an instruction sheet on how to perform these two simple exercises.  Do these at least three times per day if not more.  You may bend your knee as much as is comfortable.  The puncture wounds may occasionally be slightly uncomfortable with bending of the knee.  Do not let this frighten you.  It is important to keep your knee motion, but do not overdo it.  If you have significant pain, simply do not bend the knee as far.   You will be given more exercises to perform at your first return visit.    RETURN APPOINTMENT Please make an appointment to be seen by your doctor in *** days from your surgery.  Patient Signature:  ________________________________________________________  Nurse's Signature:  ________________________________________________________ Post Anesthesia Home Care Instructions  Activity: Get plenty of rest for  the remainder of the day. A responsible adult should stay with you for 24 hours following the procedure.  For the next 24 hours, DO NOT: -Drive a car -Advertising copywriterperate machinery -Drink alcoholic beverages -Take any medication unless instructed by your physician -Make any legal decisions or sign important papers.  Meals: Start with liquid foods such as gelatin or soup. Progress to regular foods as tolerated. Avoid greasy, spicy, heavy foods. If nausea and/or vomiting occur, drink only clear liquids until the nausea and/or vomiting subsides. Call your physician if vomiting  continues.  Special Instructions/Symptoms: Your throat may feel dry or sore from the anesthesia or the breathing tube placed in your throat during surgery. If this causes discomfort, gargle with warm salt water. The discomfort should disappear within 24 hours.

## 2013-10-21 NOTE — Brief Op Note (Signed)
10/21/2013  4:19 PM  PATIENT:  Jessica SacramentoKathryn A Mullen  53 y.o. female  PRE-OPERATIVE DIAGNOSIS:  1. LEFT KNEE MEDIAL MENISCAL TEAR PATELLAR FEMORAL OSTEOARTHRITIS  POST-OPERATIVE DIAGNOSIS:  1.  LEFT KNEE MEDIAL MENISCAL TEAR, 2.  Grade III  PATELLAR FEMORAL Chondromalacia  PROCEDURE:  Procedure(s): LEFT ARTHROSCOPY KNEE WITH DEBRIDEMENT (Left)  SURGEON:  Surgeon(s) and Role:    * Shelda PalMatthew D Skyleigh Windle, MD - Primary  PHYSICIAN ASSISTANT: None  ANESTHESIA:   general  EBL:  Total I/O In: 500 [I.V.:500] Out: -   BLOOD ADMINISTERED:none  DRAINS: none   LOCAL MEDICATIONS USED:  MARCAINE     SPECIMEN:  No Specimen  DISPOSITION OF SPECIMEN:  N/A  COUNTS:  YES  TOURNIQUET:  * No tourniquets in log *  DICTATION: .Other Dictation: Dictation Number [id number]  PLAN OF CARE: Discharge to home after PACU  PATIENT DISPOSITION:  PACU - hemodynamically stable.   Delay start of Pharmacological VTE agent (>24hrs) due to surgical blood loss or risk of bleeding: no

## 2013-10-22 NOTE — Op Note (Signed)
NAME:  Jessica, Mullen NO.:  [id number]  MEDICAL RECORD NO.:  [id number]  LOCATION:                                 FACILITY:  PHYSICIAN:  Madlyn Frankel. Charlann Boxer, M.D.       DATE OF BIRTH:  DATE OF PROCEDURE:  10/21/2013 DATE OF DISCHARGE:                              OPERATIVE REPORT   PREOPERATIVE DIAGNOSIS:  Left knee medial meniscal tear associated with chondromalacia patella.  POSTOPERATIVE DIAGNOSIS/FINDINGS: 1. Complex tear of the posterior horn at the medial meniscus into the     midbody junction. 2. Grade 2-3 chondromalacia on the lateral facet, apex of the patella     with a relatively intact trochlea.  PROCEDURE: 1. Left knee diagnostic and operative arthroscopy with medial partial     meniscectomy. 2. Patellofemoral chondroplasty and debridement of chondral flaps.  No     evidence of eburnated bone and no microfracture.  SURGEON:  Madlyn Frankel. Charlann Boxer, M.D.  ASSISTANT:  Surgical team.  ANESTHESIA:  General LMA plus local at the end.  COMPLICATION:  None.  DRAINS:  None.  TOURNIQUET:  Not utilized.  INDICATION FOR THE PROCEDURE:  Jessica Mullen is a 53 year old female, who aggravated her knee as she was beginning to ramp up her exercise program, doing some lunges and squats.  She felt a pop one night.  She has had pain since that time, we tried conservative measures, activity modification, strengthening, medications, and cortisone injections, all without much relief.  She had persistent mechanical symptoms.  This eventually led to the diagnostic evaluation with an MRI.  MRI revealed and confirmed suspicion to the medial meniscal tear.  After reviewing with her the persistence of her symptoms, the MRI findings correlating with exam findings as well as her symptoms, we discussed arthroscopic surgery.  The risks, benefits, and necessity of the procedure were reviewed including the risk of infection, DVT, the risk of recurrent meniscal pathology.  We  discussed the postoperative course and expectations as well consent was obtained for benefit of pain relief.  PROCEDURE IN DETAIL:  The patient was brought to operative theater. Once adequate anesthesia, preoperative antibiotics were administered, Ancef.  She was positioned supine with the left leg placed in a leg holder.  Left lower extremity was then prepped and draped sterile fashion.  Time-out was performed identifying the patient, planned procedure, and extremity.  Standard inferomedial, superomedial, and inferior lateral portals were utilized.  Diagnostic evaluation of knee revealed the above findings.  Following probe examination of the meniscus, I used a combination of the straight biting basket and a 3.5 Cuda shaver to perform the partial medial meniscectomy.  The meniscus was noted to be fairly soft with a large radial tear in the mid body and posterior horn junction as was confirmed by MRI.  The midbody segment appeared to be relatively stable.  However, there was extensive horizontal cleavage-type tear into the posterior horn segment.  Thus, the meniscus was debrided back to the central to peripheral rim within the  posterior horn segment.  Following this debridement, a probe examination revealed stability of the meniscus fragments.  There was intact superior leaflet.  Following the debridement of the medial compartment,  I  evaluated the anterior cruciate ligament noting that it was stable and intact. Lateral compartment was normal. Anteriorly as we identified from a diagnostic standpoint, grade 2-3 chondromalacia over the anterior apex and lateral facet, which was debrided back using a 3.5 Cuda shaver back to a stable level.  Again identifying a stable trochlea.  At this point, final pictures were taken.  The instrumentation was then removed.  The knee portal sites were reapproximated with 4-0 nylon.  The knee was injected in the case with 0.25% Marcaine with  epinephrine.  The knee was then wrapped in sterile bulky Jones dressing.  She was brought to the recovery room in stable condition and tolerated the procedure well.     Madlyn FrankelMatthew D. Charlann Boxerlin, M.D.     MDO/MEDQ  D:  10/21/2013  T:  10/22/2013  Job:  [id number]

## 2013-10-24 ENCOUNTER — Encounter (HOSPITAL_BASED_OUTPATIENT_CLINIC_OR_DEPARTMENT_OTHER): Payer: Self-pay | Admitting: Orthopedic Surgery

## 2013-11-01 NOTE — Anesthesia Postprocedure Evaluation (Signed)
  Anesthesia Post-op Note  Patient: Jessica Mullen  Procedure(s) Performed: Procedure(s) (LRB): LEFT ARTHROSCOPY KNEE WITH DEBRIDEMENT (Left)  Patient Location: PACU  Anesthesia Type: General  Level of Consciousness: awake and alert   Airway and Oxygen Therapy: Patient Spontanous Breathing  Post-op Pain: mild  Post-op Assessment: Post-op Vital signs reviewed, Patient's Cardiovascular Status Stable, Respiratory Function Stable, Patent Airway and No signs of Nausea or vomiting  Last Vitals:  Filed Vitals:   10/21/13 1750  BP: 112/75  Pulse: 62  Temp: 36 C  Resp: 18    Post-op Vital Signs: stable   Complications: No apparent anesthesia complications

## 2013-12-08 IMAGING — CR DG CHEST 1V
1 series · 1 of 1 positions shown · non-contrast
Comparison: None.

CLINICAL DATA: Syncope.  Dizziness.

CHEST - 1 VIEW

[AP]
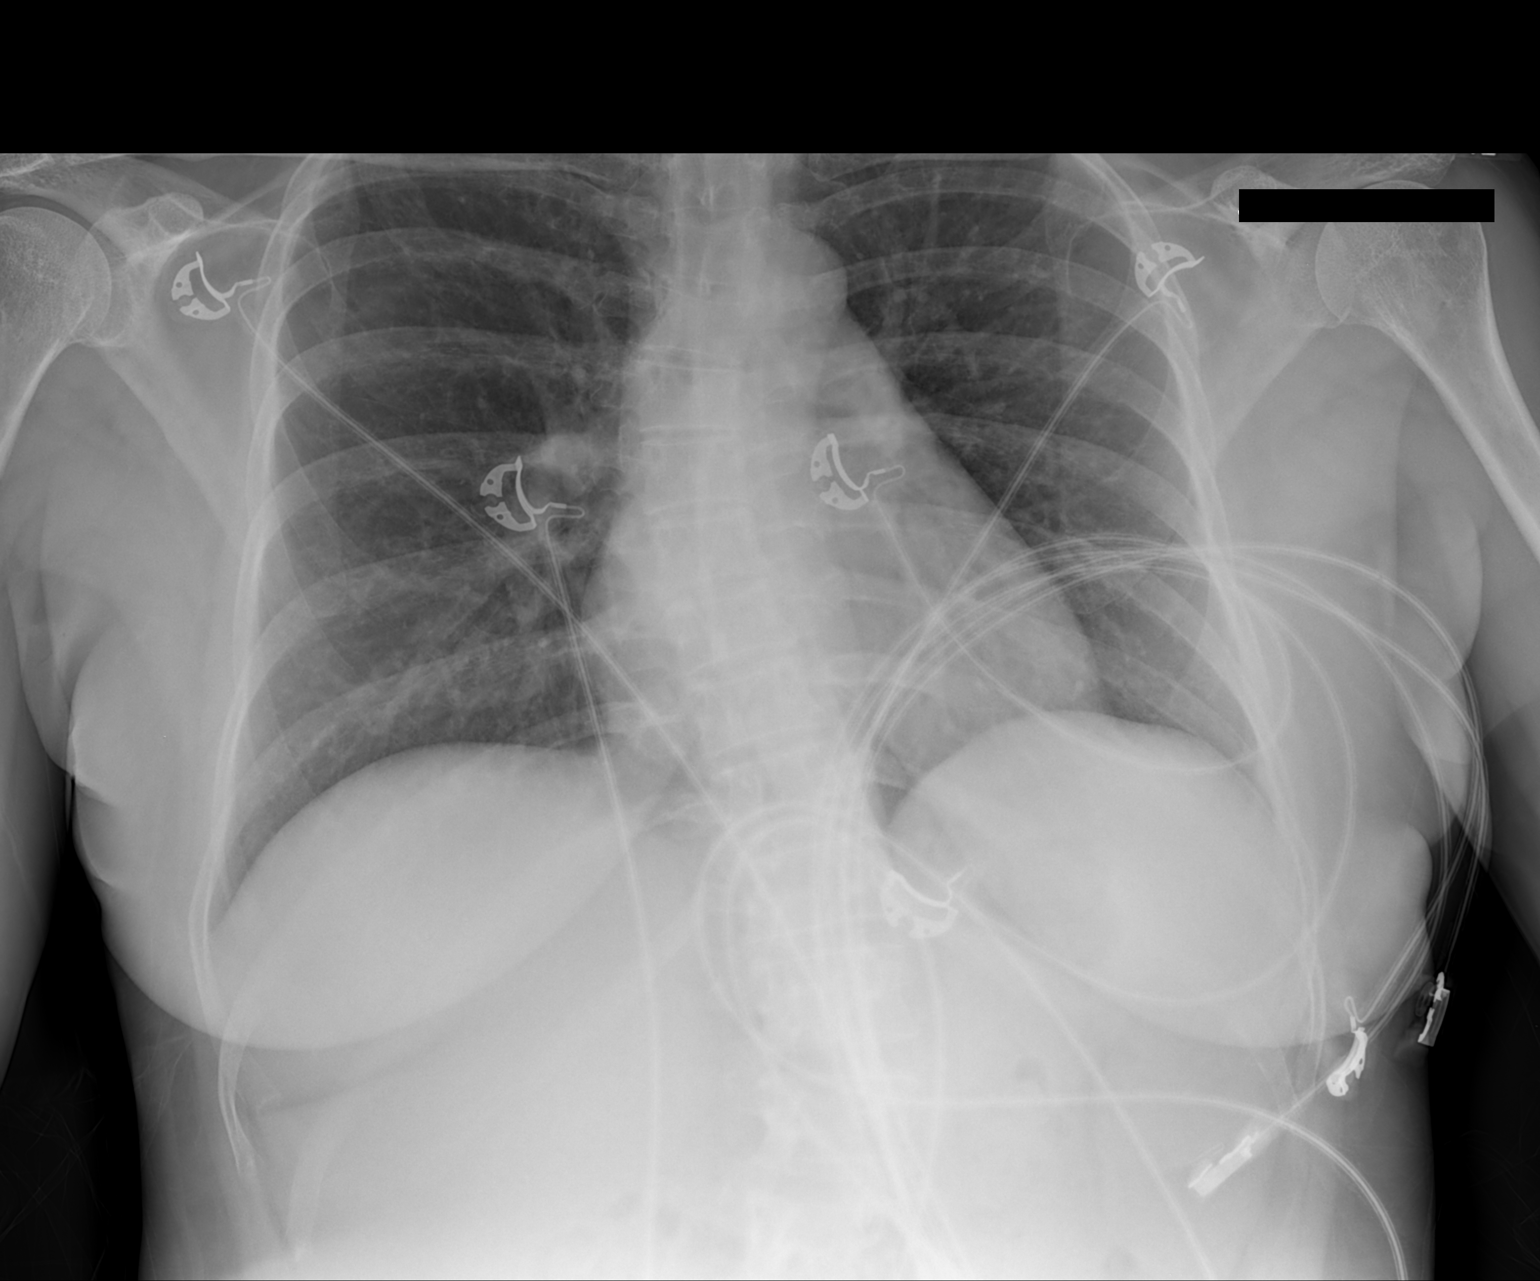

[1 of 1 positions shown; findings below may reference images not displayed]

FINDINGS: Heart size and pulmonary vascularity are normal and the
lungs are clear.  There is a thoracolumbar scoliosis.  No acute
osseous abnormality.
IMPRESSION: No acute disease.

## 2013-12-08 IMAGING — CT CT HEAD W/O CM
2 series · 16 of 30 positions shown, 20 images · non-contrast
Comparison: 03/06/2008

CLINICAL DATA: Loss of consciousness

CT HEAD WITHOUT CONTRAST
TECHNIQUE: Contiguous axial images were obtained from the base of
the skull through the vertex without contrast.

[Series 2: head w/o · axial · non-contrast · 0.43mm/px · z∈[-157,-37]mm · 13 of 30 slices shown, 17 images]
[im 3/30  brain]
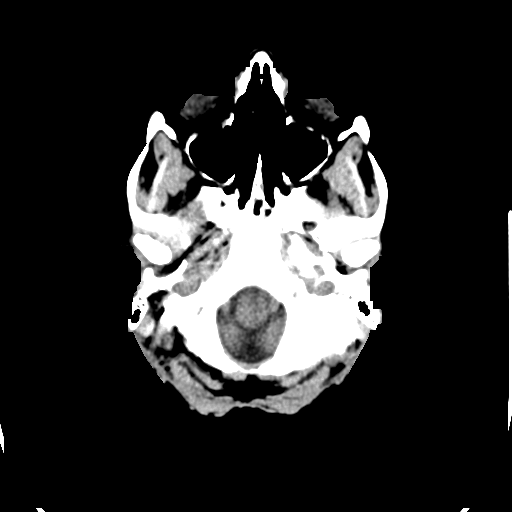
[im 3/30  bone]
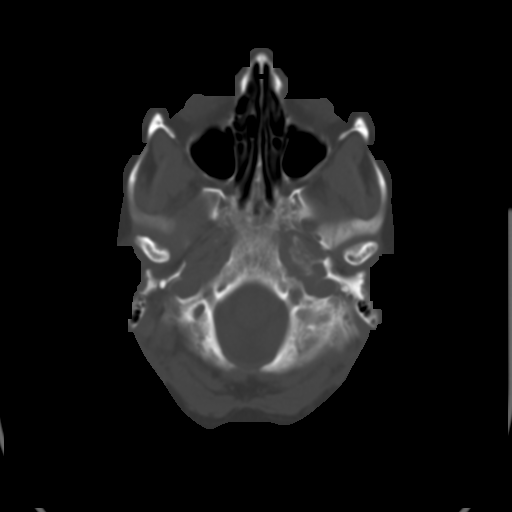
[im 5/30  brain]
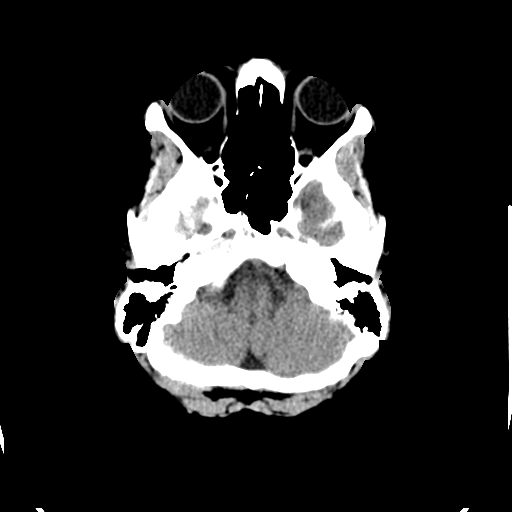
[im 7/30  brain]
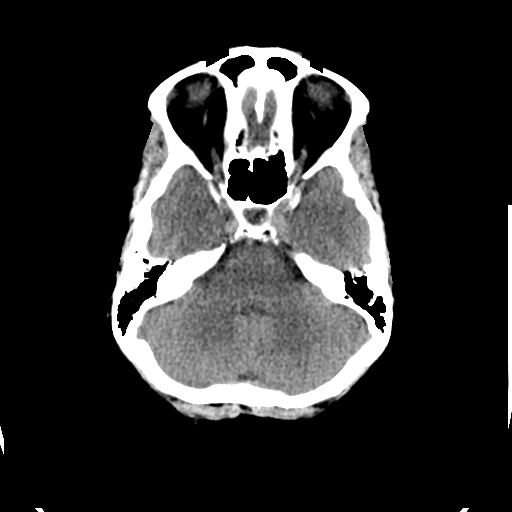
[im 9/30  brain]
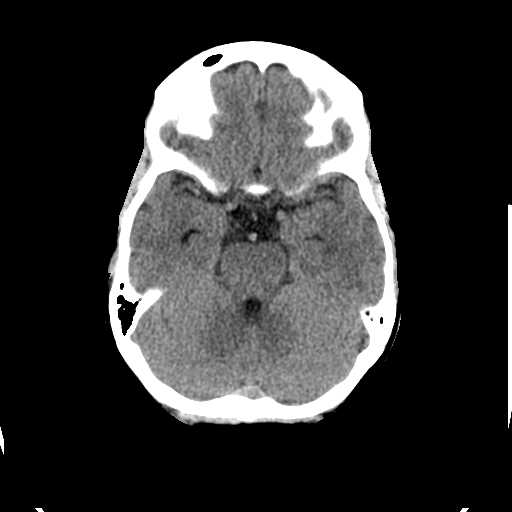
[im 11/30  brain]
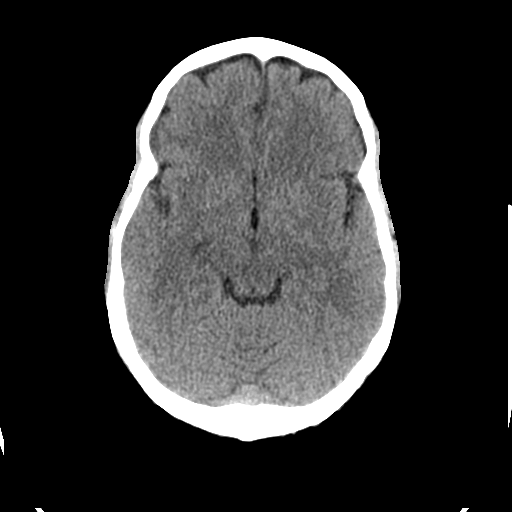
[im 11/30  bone]
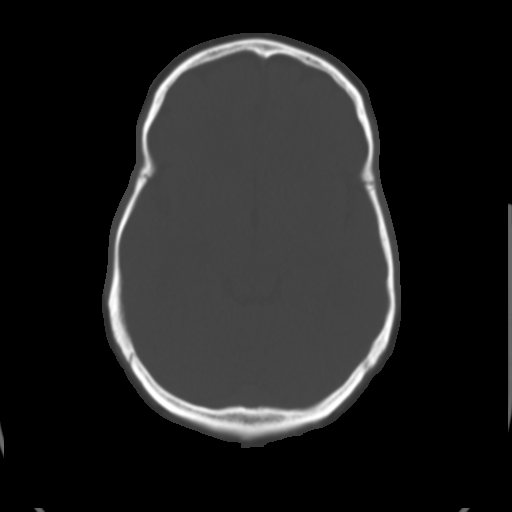
[im 13/30  brain]
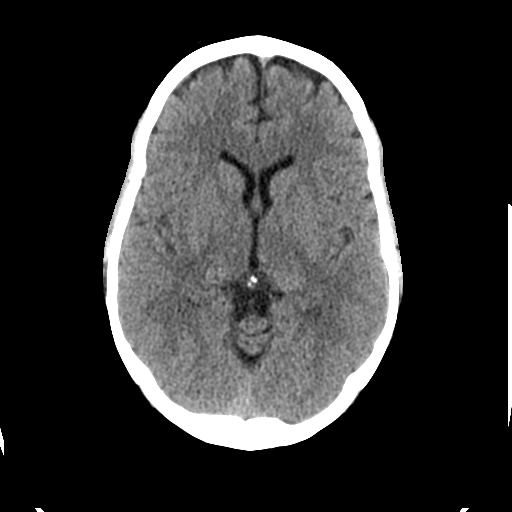
[im 15/30  brain]
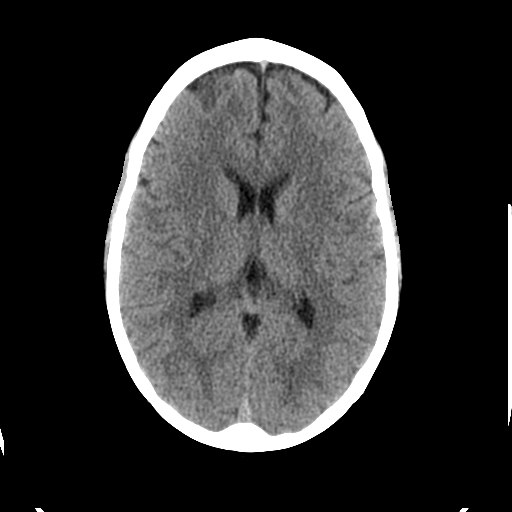
[im 17/30  brain]
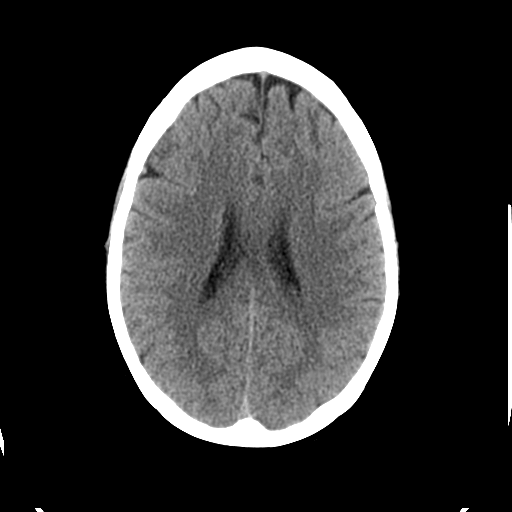
[im 19/30  brain]
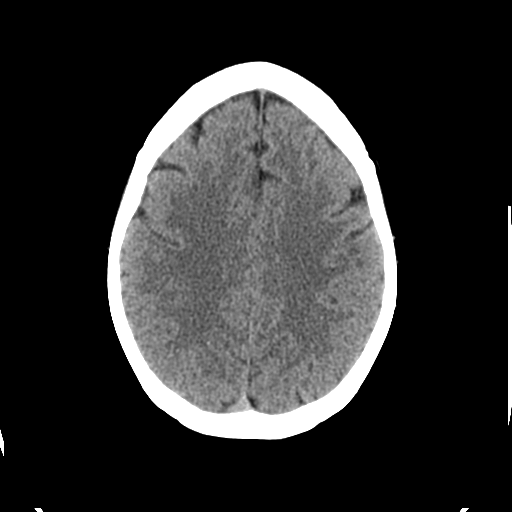
[im 19/30  bone]
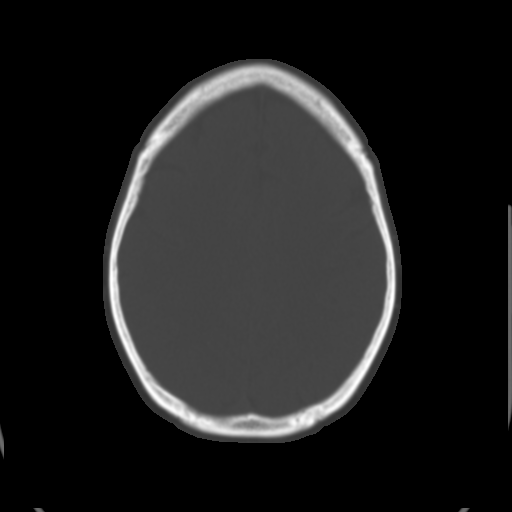
[im 21/30  brain]
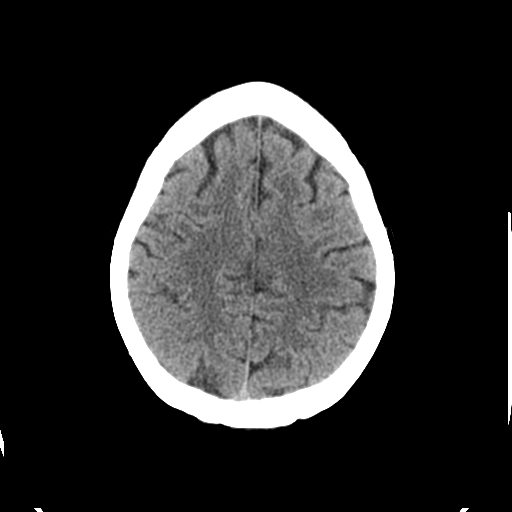
[im 23/30  brain]
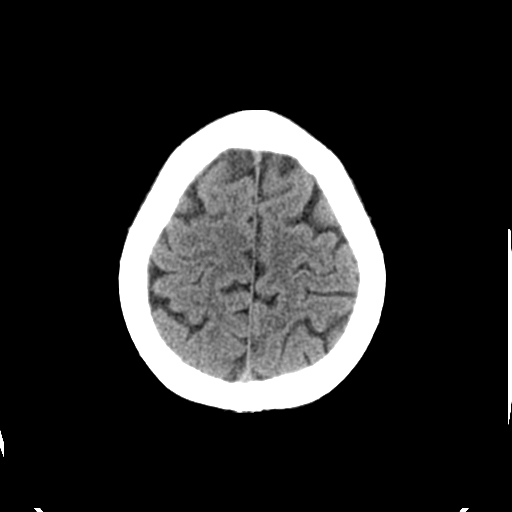
[im 25/30  brain]
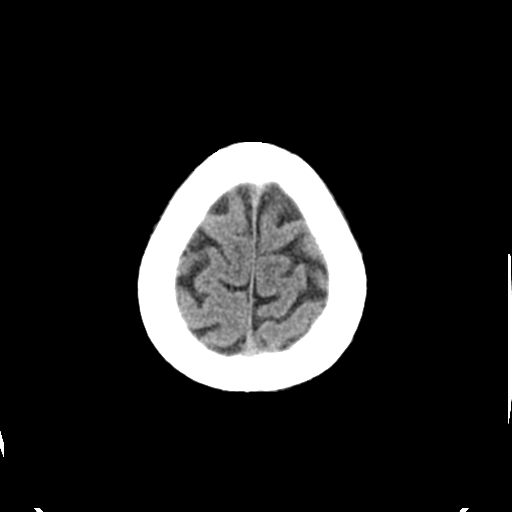
[im 27/30  brain]
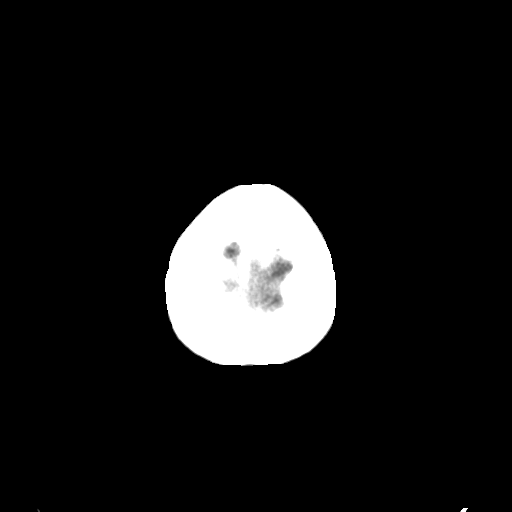
[im 27/30  bone]
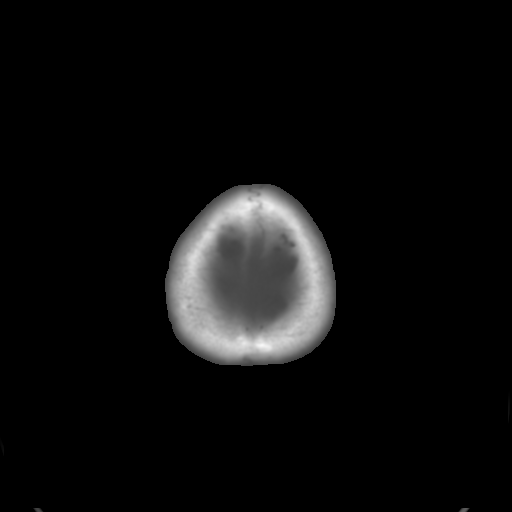

[Series 3: bone windows · axial · 0.43mm/px · z∈[-157,-117]mm · 3 of 30 slices shown]
[im 3/30  bone]
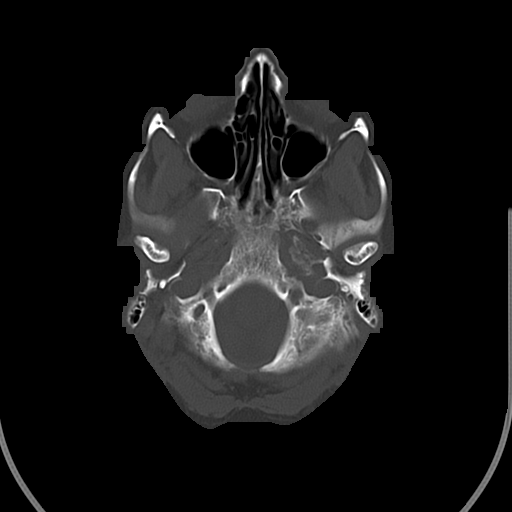
[im 7/30  bone]
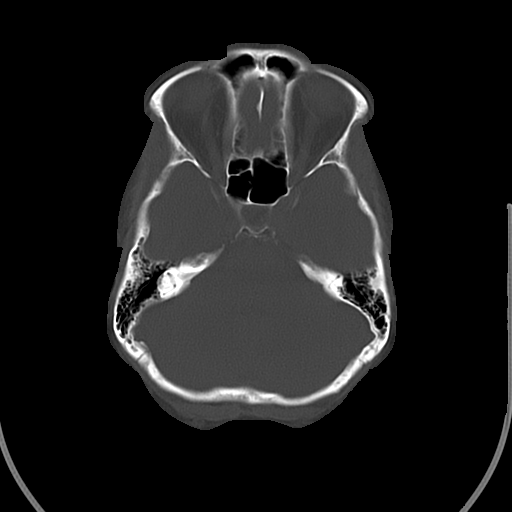
[im 11/30  bone]
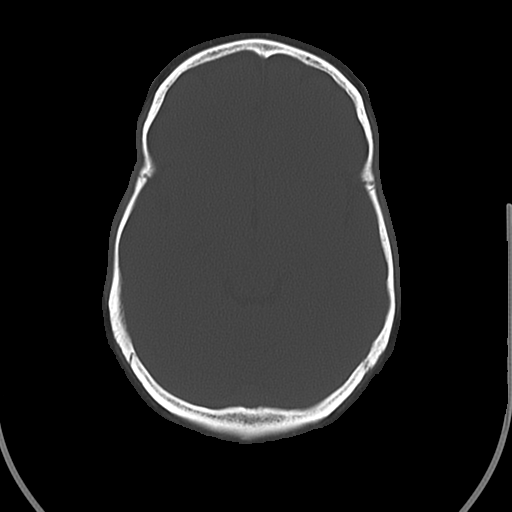

[16 of 30 positions shown; findings below may reference images not displayed]

FINDINGS: There is no evidence for acute hemorrhage, hydrocephalus,
mass lesion, or abnormal extra-axial fluid collection.  No definite
CT evidence for acute infarction.  The visualized paranasal sinuses
and mastoid air cells are predominately clear.  The
IMPRESSION: No acute intracranial abnormality.

## 2013-12-11 IMAGING — US US ABDOMEN COMPLETE
1 series · 14 of 25 positions shown · non-contrast
Comparison: Abdomen and pelvis CT, 12/24/2010

CLINICAL DATA: Elevated liver function tests

COMPLETE ABDOMINAL ULTRASOUND

[Series 1: us abdomen complete · 0.32mm/px · 14 of 63 slices shown]
[im 1/63]
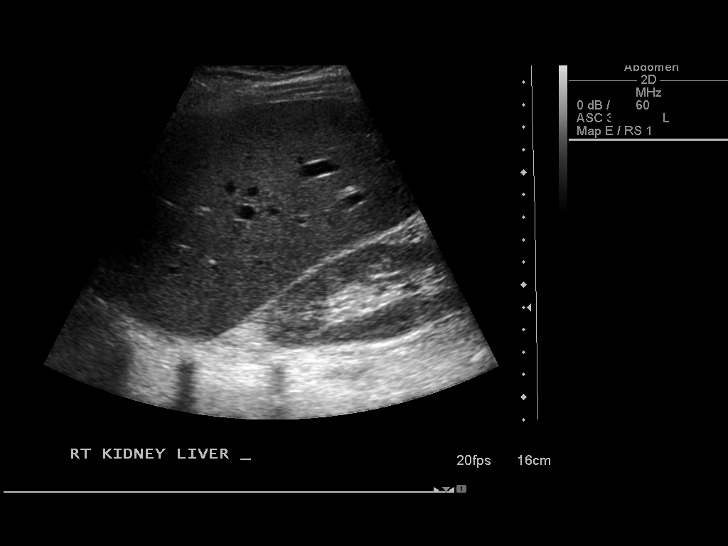
[im 6/63]
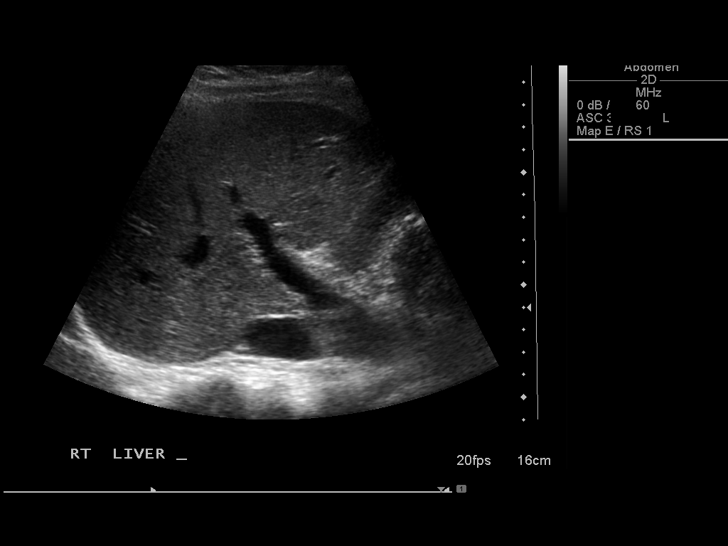
[im 11/63]
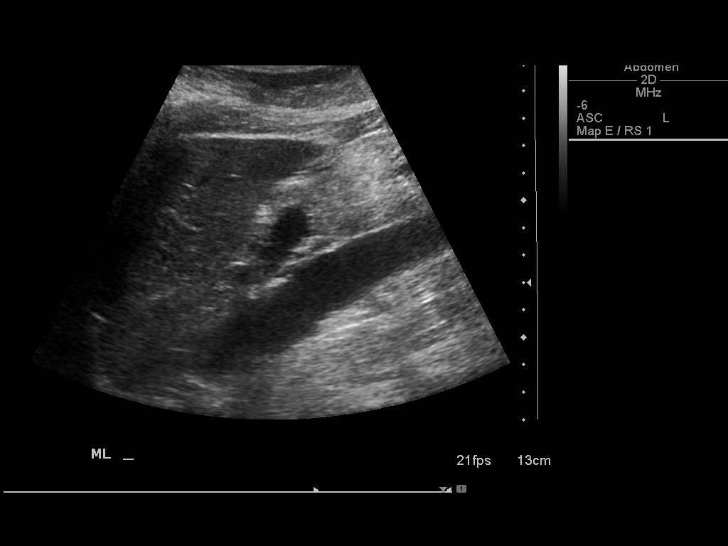
[im 16/63]
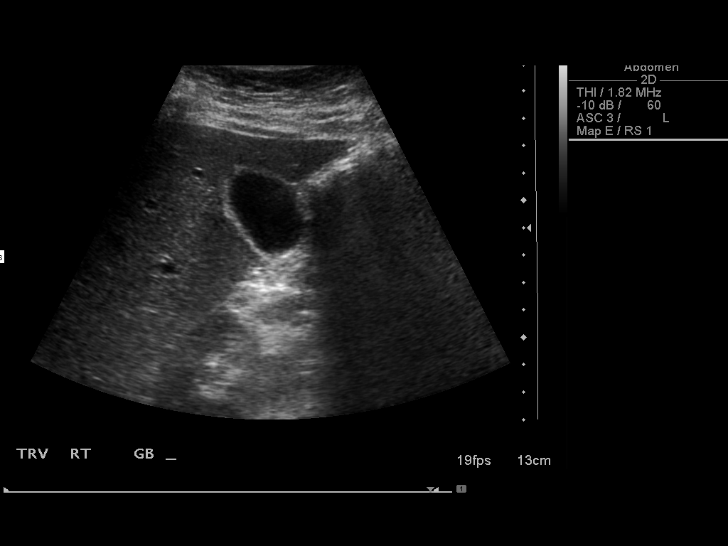
[im 21/63]
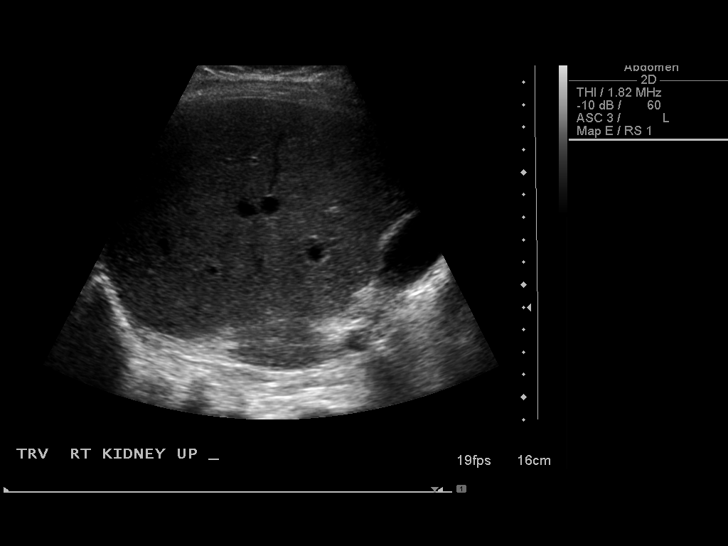
[im 24/63]
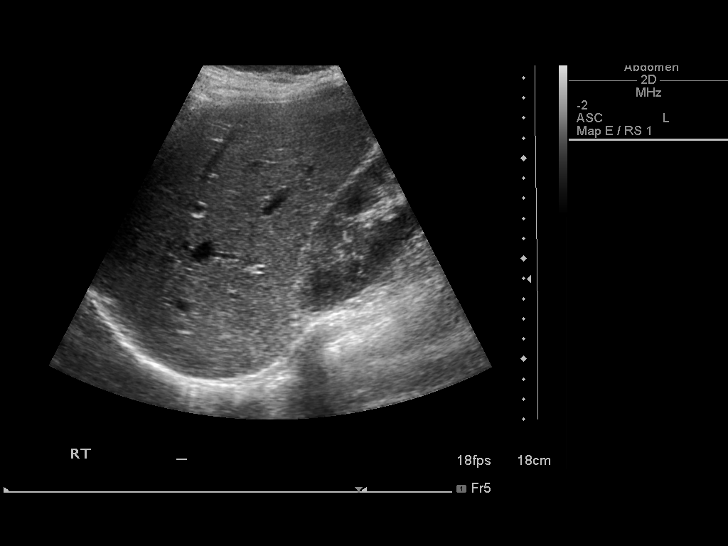
[im 29/63]
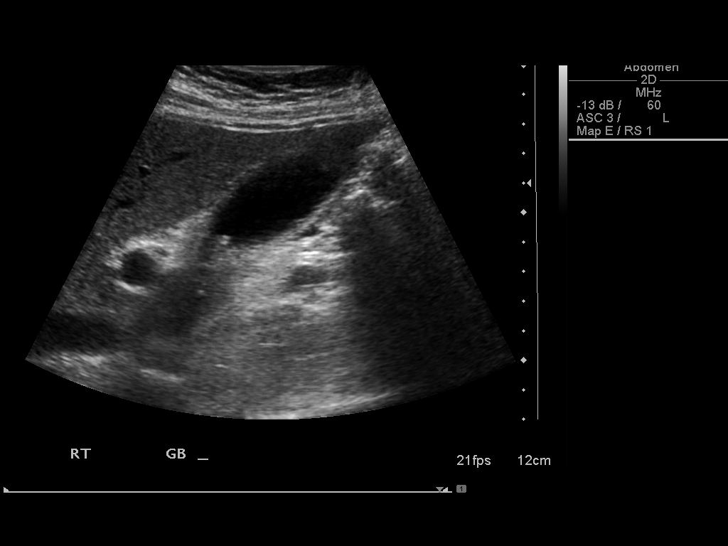
[im 34/63]
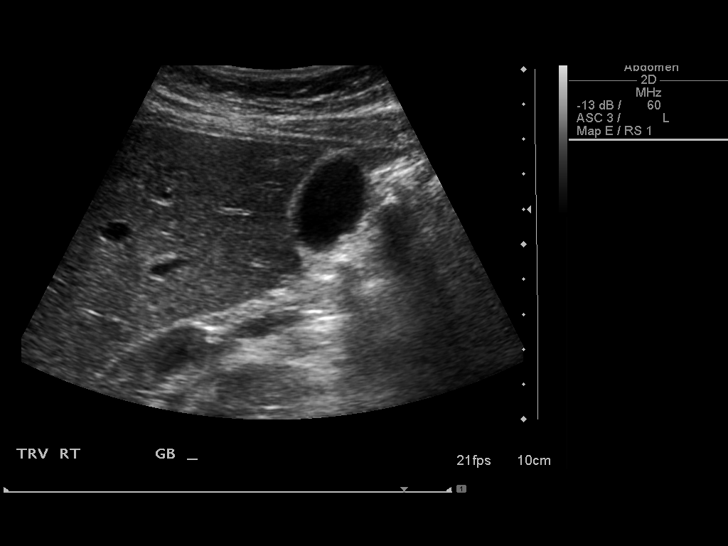
[im 39/63]
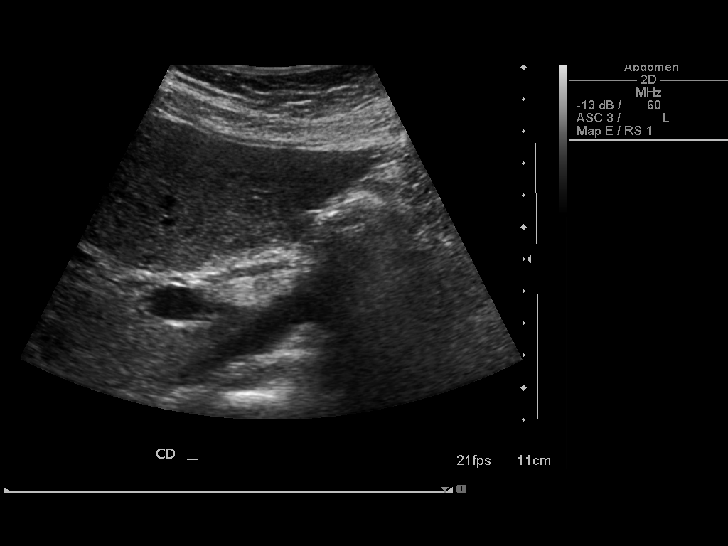
[im 42/63]
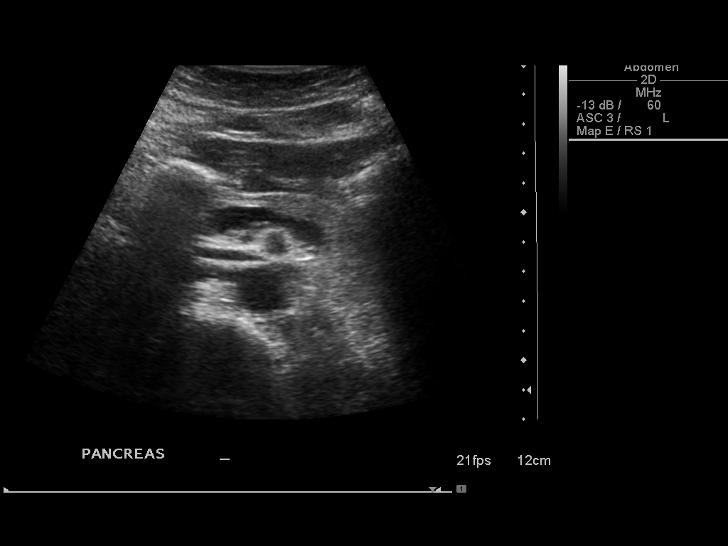
[im 47/63]
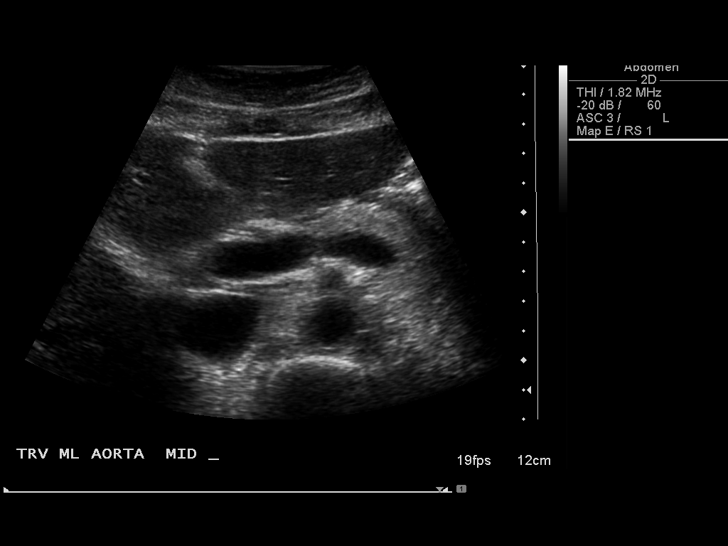
[im 52/63]
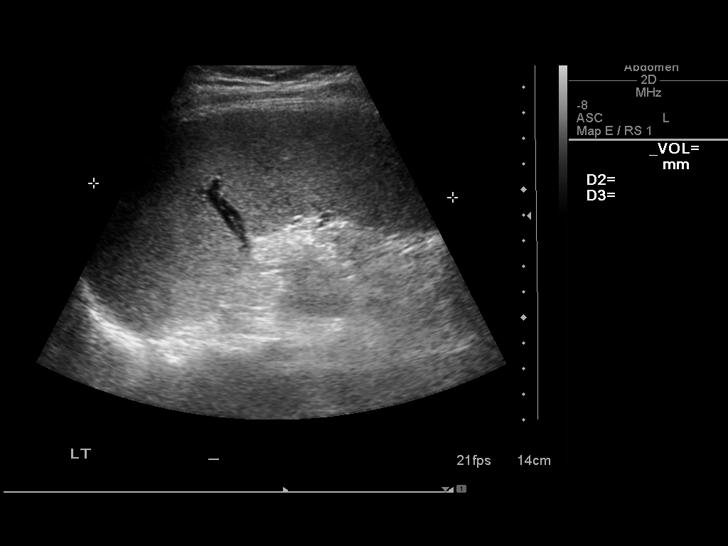
[im 57/63]
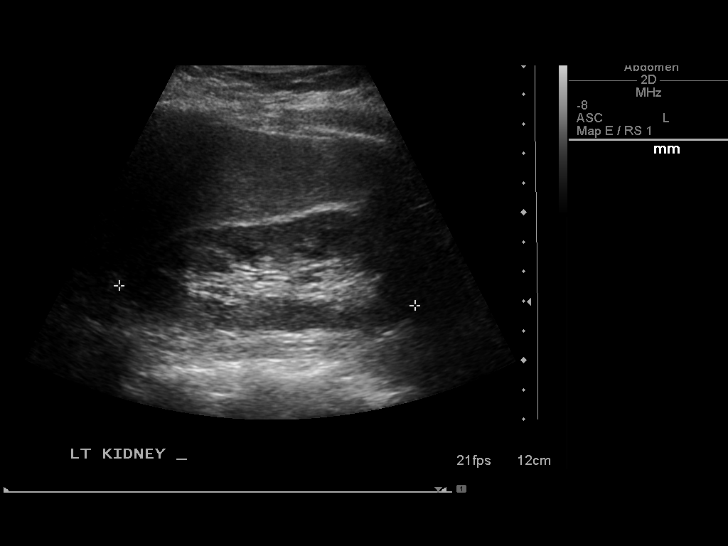
[im 63/63]
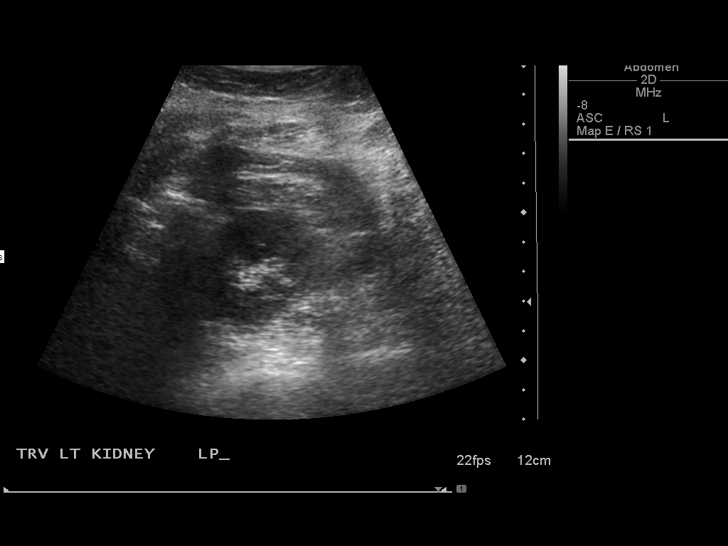

[14 of 25 positions shown; findings below may reference images not displayed]

FINDINGS: Gallbladder:  Gallbladder is moderately distended.  There are three
small polyps, but no stones or wall thickening or evidence of acute
cholecystitis.

Common bile duct:  Normal in caliber measuring 5 mm

Liver:  No focal lesion identified.  Within normal limits in
parenchymal echogenicity.

IVC:  Appears normal.

Pancreas:  Normal in size and echogenicity with no masses

Spleen:  Spleen is mildly enlarged measuring 14.1 cm x 14.9 cm x 5
cm.  This yields a volume of 552 ml.  No splenic mass or focal
lesion.

Right Kidney:  Normal measuring 12.1 cm in length

Left Kidney:  Normal measuring 10 cm in length

Abdominal aorta:  Normal in caliber
IMPRESSION: Normal gallbladder.  No bile duct dilation.  Normal liver.

Mild splenomegaly of unclear etiology.  Exam otherwise
unremarkable.

## 2014-06-19 ENCOUNTER — Encounter (HOSPITAL_BASED_OUTPATIENT_CLINIC_OR_DEPARTMENT_OTHER): Payer: Self-pay | Admitting: Orthopedic Surgery

## 2014-07-03 ENCOUNTER — Encounter: Payer: Self-pay | Admitting: Gynecology

## 2014-07-03 ENCOUNTER — Ambulatory Visit (INDEPENDENT_AMBULATORY_CARE_PROVIDER_SITE_OTHER): Payer: No Typology Code available for payment source | Admitting: Gynecology

## 2014-07-03 VITALS — BP 130/74 | Ht 61.5 in | Wt 164.0 lb

## 2014-07-03 DIAGNOSIS — Z01419 Encounter for gynecological examination (general) (routine) without abnormal findings: Secondary | ICD-10-CM

## 2014-07-03 DIAGNOSIS — N907 Vulvar cyst: Secondary | ICD-10-CM

## 2014-07-03 LAB — LIPID PANEL
CHOL/HDL RATIO: 3.1 ratio
Cholesterol: 184 mg/dL (ref 0–200)
HDL: 60 mg/dL (ref >39)
LDL Cholesterol: 99 mg/dL (ref 0–99)
Triglycerides: 124 mg/dL (ref <150)
VLDL: 25 mg/dL (ref 0–40)

## 2014-07-03 LAB — COMPREHENSIVE METABOLIC PANEL
ALT: 13 U/L (ref 0–35)
AST: 15 U/L (ref 0–37)
Albumin: 4.3 g/dL (ref 3.5–5.2)
Alkaline Phosphatase: 75 U/L (ref 39–117)
BILIRUBIN TOTAL: 0.4 mg/dL (ref 0.2–1.2)
BUN: 15 mg/dL (ref 6–23)
CO2: 28 mEq/L (ref 19–32)
CREATININE: 0.79 mg/dL (ref 0.50–1.10)
Calcium: 9.1 mg/dL (ref 8.4–10.5)
Chloride: 100 mEq/L (ref 96–112)
Glucose, Bld: 96 mg/dL (ref 70–99)
Potassium: 3.9 mEq/L (ref 3.5–5.3)
Sodium: 142 mEq/L (ref 135–145)
Total Protein: 7.1 g/dL (ref 6.0–8.3)

## 2014-07-03 LAB — CBC WITH DIFFERENTIAL/PLATELET
BASOS ABS: 0 10*3/uL (ref 0.0–0.1)
BASOS PCT: 0 % (ref 0–1)
EOS ABS: 0.1 10*3/uL (ref 0.0–0.7)
EOS PCT: 1 % (ref 0–5)
HCT: 40.2 % (ref 36.0–46.0)
Hemoglobin: 14 g/dL (ref 12.0–15.0)
Lymphocytes Relative: 39 % (ref 12–46)
Lymphs Abs: 3.2 10*3/uL (ref 0.7–4.0)
MCH: 30.1 pg (ref 26.0–34.0)
MCHC: 34.8 g/dL (ref 30.0–36.0)
MCV: 86.5 fL (ref 78.0–100.0)
MPV: 8.9 fL — AB (ref 9.4–12.4)
Monocytes Absolute: 0.4 10*3/uL (ref 0.1–1.0)
Monocytes Relative: 5 % (ref 3–12)
Neutro Abs: 4.5 10*3/uL (ref 1.7–7.7)
Neutrophils Relative %: 55 % (ref 43–77)
PLATELETS: 247 10*3/uL (ref 150–400)
RBC: 4.65 MIL/uL (ref 3.87–5.11)
RDW: 13.1 % (ref 11.5–15.5)
WBC: 8.1 10*3/uL (ref 4.0–10.5)

## 2014-07-03 LAB — TSH: TSH: 4.115 u[IU]/mL (ref 0.350–4.500)

## 2014-07-03 NOTE — Patient Instructions (Signed)
You may obtain a copy of any labs that were done today by logging onto MyChart as outlined in the instructions provided with your AVS (after visit summary). The office will not call with normal lab results but certainly if there are any significant abnormalities then we will contact you.   Health Maintenance, Female A healthy lifestyle and preventative care can promote health and wellness.  Maintain regular health, dental, and eye exams.  Eat a healthy diet. Foods like vegetables, fruits, whole grains, low-fat dairy products, and lean protein foods contain the nutrients you need without too many calories. Decrease your intake of foods high in solid fats, added sugars, and salt. Get information about a proper diet from your caregiver, if necessary.  Regular physical exercise is one of the most important things you can do for your health. Most adults should get at least 150 minutes of moderate-intensity exercise (any activity that increases your heart rate and causes you to sweat) each week. In addition, most adults need muscle-strengthening exercises on 2 or more days a week.   Maintain a healthy weight. The body mass index (BMI) is a screening tool to identify possible weight problems. It provides an estimate of body fat based on height and weight. Your caregiver can help determine your BMI, and can help you achieve or maintain a healthy weight. For adults 20 years and older:  A BMI below 18.5 is considered underweight.  A BMI of 18.5 to 24.9 is normal.  A BMI of 25 to 29.9 is considered overweight.  A BMI of 30 and above is considered obese.  Maintain normal blood lipids and cholesterol by exercising and minimizing your intake of saturated fat. Eat a balanced diet with plenty of fruits and vegetables. Blood tests for lipids and cholesterol should begin at age 60 and be repeated every 5 years. If your lipid or cholesterol levels are high, you are over 50, or you are a high risk for heart  disease, you may need your cholesterol levels checked more frequently.Ongoing high lipid and cholesterol levels should be treated with medicines if diet and exercise are not effective.  If you smoke, find out from your caregiver how to quit. If you do not use tobacco, do not start.  Lung cancer screening is recommended for adults aged 5 80 years who are at high risk for developing lung cancer because of a history of smoking. Yearly low-dose computed tomography (CT) is recommended for people who have at least a 30-pack-year history of smoking and are a current smoker or have quit within the past 15 years. A pack year of smoking is smoking an average of 1 pack of cigarettes a day for 1 year (for example: 1 pack a day for 30 years or 2 packs a day for 15 years). Yearly screening should continue until the smoker has stopped smoking for at least 15 years. Yearly screening should also be stopped for people who develop a health problem that would prevent them from having lung cancer treatment.  If you are pregnant, do not drink alcohol. If you are breastfeeding, be very cautious about drinking alcohol. If you are not pregnant and choose to drink alcohol, do not exceed 1 drink per day. One drink is considered to be 12 ounces (355 mL) of beer, 5 ounces (148 mL) of wine, or 1.5 ounces (44 mL) of liquor.  Avoid use of street drugs. Do not share needles with anyone. Ask for help if you need support or instructions about stopping  the use of drugs.  High blood pressure causes heart disease and increases the risk of stroke. Blood pressure should be checked at least every 1 to 2 years. Ongoing high blood pressure should be treated with medicines, if weight loss and exercise are not effective.  If you are 43 to 51 years old, ask your caregiver if you should take aspirin to prevent strokes.  Diabetes screening involves taking a blood sample to check your fasting blood sugar level. This should be done once every 3  years, after age 21, if you are within normal weight and without risk factors for diabetes. Testing should be considered at a younger age or be carried out more frequently if you are overweight and have at least 1 risk factor for diabetes.  Breast cancer screening is essential preventative care for women. You should practice "breast self-awareness." This means understanding the normal appearance and feel of your breasts and may include breast self-examination. Any changes detected, no matter how small, should be reported to a caregiver. Women in their 61s and 30s should have a clinical breast exam (CBE) by a caregiver as part of a regular health exam every 1 to 3 years. After age 81, women should have a CBE every year. Starting at age 44, women should consider having a mammogram (breast X-ray) every year. Women who have a family history of breast cancer should talk to their caregiver about genetic screening. Women at a high risk of breast cancer should talk to their caregiver about having an MRI and a mammogram every year.  Breast cancer gene (BRCA)-related cancer risk assessment is recommended for women who have family members with BRCA-related cancers. BRCA-related cancers include breast, ovarian, tubal, and peritoneal cancers. Having family members with these cancers may be associated with an increased risk for harmful changes (mutations) in the breast cancer genes BRCA1 and BRCA2. Results of the assessment will determine the need for genetic counseling and BRCA1 and BRCA2 testing.  The Pap test is a screening test for cervical cancer. Women should have a Pap test starting at age 79. Between ages 16 and 52, Pap tests should be repeated every 2 years. Beginning at age 94, you should have a Pap test every 3 years as long as the past 3 Pap tests have been normal. If you had a hysterectomy for a problem that was not cancer or a condition that could lead to cancer, then you no longer need Pap tests. If you are  between ages 32 and 10, and you have had normal Pap tests going back 10 years, you no longer need Pap tests. If you have had past treatment for cervical cancer or a condition that could lead to cancer, you need Pap tests and screening for cancer for at least 20 years after your treatment. If Pap tests have been discontinued, risk factors (such as a new sexual partner) need to be reassessed to determine if screening should be resumed. Some women have medical problems that increase the chance of getting cervical cancer. In these cases, your caregiver may recommend more frequent screening and Pap tests.  The human papillomavirus (HPV) test is an additional test that may be used for cervical cancer screening. The HPV test looks for the virus that can cause the cell changes on the cervix. The cells collected during the Pap test can be tested for HPV. The HPV test could be used to screen women aged 49 years and older, and should be used in women of any age  who have unclear Pap test results. After the age of 55, women should have HPV testing at the same frequency as a Pap test.  Colorectal cancer can be detected and often prevented. Most routine colorectal cancer screening begins at the age of 44 and continues through age 20. However, your caregiver may recommend screening at an earlier age if you have risk factors for colon cancer. On a yearly basis, your caregiver may provide home test kits to check for hidden blood in the stool. Use of a small camera at the end of a tube, to directly examine the colon (sigmoidoscopy or colonoscopy), can detect the earliest forms of colorectal cancer. Talk to your caregiver about this at age 86, when routine screening begins. Direct examination of the colon should be repeated every 5 to 10 years through age 13, unless early forms of pre-cancerous polyps or small growths are found.  Hepatitis C blood testing is recommended for all people born from [birth year] through 1963 and any  individual with known risks for hepatitis C.  Practice safe sex. Use condoms and avoid high-risk sexual practices to reduce the spread of sexually transmitted infections (STIs). Sexually active women aged 36 and younger should be checked for Chlamydia, which is a common sexually transmitted infection. Older women with new or multiple partners should also be tested for Chlamydia. Testing for other STIs is recommended if you are sexually active and at increased risk.  Osteoporosis is a disease in which the bones lose minerals and strength with aging. This can result in serious bone fractures. The risk of osteoporosis can be identified using a bone density scan. Women ages 20 and over and women at risk for fractures or osteoporosis should discuss screening with their caregivers. Ask your caregiver whether you should be taking a calcium supplement or vitamin D to reduce the rate of osteoporosis.  Menopause can be associated with physical symptoms and risks. Hormone replacement therapy is available to decrease symptoms and risks. You should talk to your caregiver about whether hormone replacement therapy is right for you.  Use sunscreen. Apply sunscreen liberally and repeatedly throughout the day. You should seek shade when your shadow is shorter than you. Protect yourself by wearing long sleeves, pants, a wide-brimmed hat, and sunglasses year round, whenever you are outdoors.  Notify your caregiver of new moles or changes in moles, especially if there is a change in shape or color. Also notify your caregiver if a mole is larger than the size of a pencil eraser.  Stay current with your immunizations. Document Released: 02/17/2011 Document Revised: 11/29/2012 Document Reviewed: 02/17/2011 [facility] Patient Information 2014 [employer].

## 2014-07-03 NOTE — Progress Notes (Signed)
Jessica SacramentoKathryn A Mullen [birth year] [id number]        53 y.o.  G2P0002 for annual exam.  Several issues noted below.  Past medical history,surgical history, problem list, medications, allergies, family history and social history were all reviewed and documented as reviewed in the EPIC chart.  ROS:  12 system ROS performed with pertinent positives and negatives included in the history, assessment and plan.   Additional significant findings :  none   Exam: Jessica Mullen:   07/03/14 1153  BP: 130/74  Height: 5' 1.5" (1.562 m)  Weight: 164 lb (74.39 kg)   General appearance:  Normal affect, orientation and appearance. Skin: Grossly normal HEENT: Without gross lesions.  No cervical or supraclavicular adenopathy. Thyroid normal.  Lungs:  Clear without wheezing, rales or rhonchi Cardiac: RR, without RMG Abdominal:  Soft, nontender, without masses, guarding, rebound, organomegaly or hernia Breasts:  Examined lying and sitting without masses, retractions, discharge or axillary adenopathy. Pelvic:  Ext/BUS/vagina normal with small sebaceous cyst lower right labia majora. Small sebaceous cyst lower left labia majora. Both classic in appearance  Cervix normal  Uterus anteverted, normal size, shape and contour, midline and mobile nontender   Adnexa  Without masses or tenderness    Anus and perineum  Normal   Rectovaginal  Normal sphincter tone without palpated masses or tenderness.    Assessment/Plan:  53 y.o. 772P0002 female for annual exam.   1. Postmenopausal. Without significant hot flushes, night sweats, vaginal dryness or dyspareunia. No vaginal bleeding. Continue to monitor. Report any vaginal bleeding. 2. Vulvar sebaceous cysts bilateral. Small and classic in appearance. Not bothersome to patient. Plan expectant management. Follow up if they change. 3. Pap smear/HPV negative 2013. No Pap smear done today. No history of abnormal Pap smears previously.  Repeat Pap smear at 3-5 year  interval. 4. Mammography do now and I reminded patient to call and schedule and she agrees to do so. SBE monthly reviewed. 5. Colonoscopy 10 years ago. [facility] done to see when they want to repeat this. 6. Health maintenance. Baseline CBC comprehensive metabolic panel lipid profile urinalysis TSH vitamin D ordered. Follow up 1 year, sooner as needed.     Dara LordsFONTAINE,Iniko Robles P MD, 12:29 PM 07/03/2014

## 2014-07-04 ENCOUNTER — Other Ambulatory Visit: Payer: Self-pay | Admitting: Gynecology

## 2014-07-04 DIAGNOSIS — E559 Vitamin D deficiency, unspecified: Secondary | ICD-10-CM

## 2014-07-04 LAB — URINALYSIS W MICROSCOPIC + REFLEX CULTURE
Bacteria, UA: NONE SEEN
Bilirubin Urine: NEGATIVE
Casts: NONE SEEN
Crystals: NONE SEEN
Glucose, UA: NEGATIVE mg/dL
HGB URINE DIPSTICK: NEGATIVE
Ketones, ur: NEGATIVE mg/dL
Leukocytes, UA: NEGATIVE
Nitrite: NEGATIVE
Protein, ur: NEGATIVE mg/dL
SQUAMOUS EPITHELIAL / LPF: NONE SEEN
Specific Gravity, Urine: <1.005 — ABNORMAL LOW (ref 1.005–1.030)
UROBILINOGEN UA: 0.2 mg/dL (ref 0.0–1.0)
pH: 6 (ref 5.0–8.0)

## 2014-07-04 LAB — VITAMIN D 25 HYDROXY (VIT D DEFICIENCY, FRACTURES): Vit D, 25-Hydroxy: 22 ng/mL — ABNORMAL LOW (ref 30–100)

## 2014-10-17 ENCOUNTER — Encounter: Payer: Self-pay | Admitting: *Deleted

## 2014-10-17 ENCOUNTER — Emergency Department (INDEPENDENT_AMBULATORY_CARE_PROVIDER_SITE_OTHER)
Admission: EM | Admit: 2014-10-17 | Discharge: 2014-10-17 | Disposition: A | Discharge status: Home / Self Care | Payer: PRIVATE HEALTH INSURANCE | Source: Home / Self Care | Attending: Emergency Medicine | Admitting: Emergency Medicine

## 2014-10-17 DIAGNOSIS — J209 Acute bronchitis, unspecified: Secondary | ICD-10-CM

## 2014-10-17 DIAGNOSIS — J0101 Acute recurrent maxillary sinusitis: Secondary | ICD-10-CM

## 2014-10-17 HISTORY — DX: Dislocation of jaw, unspecified side, initial encounter: S03.00XA

## 2014-10-17 MED ORDER — PROMETHAZINE-CODEINE 6.25-10 MG/5ML PO SYRP: ORAL_SOLUTION | ORAL | Status: DC

## 2014-10-17 MED ORDER — CEFDINIR 300 MG PO CAPS: 300.0000 mg | ORAL_CAPSULE | Freq: Two times a day (BID) | ORAL | Status: DC

## 2014-10-17 MED ORDER — FLUTICASONE PROPIONATE 50 MCG/ACT NA SUSP: NASAL | Status: DC

## 2014-10-17 NOTE — ED Provider Notes (Signed)
CSN: [id number]     Arrival date & time 10/17/14  1742 History   First MD Initiated Contact with Patient 10/17/14 1743     Chief Complaint  Patient presents with  . Cough    HPI URI HISTORY  Jessica Mullen is a 54 y.o. female who complains of onset of cold symptoms for about 1 month, but worsening sinus and cough symptoms for 7-10 days.   Have been using over-the-counter treatment which helps minimally.  No chills/sweats +  Low-grade Fever  +  Nasal congestion +  Discolored Post-nasal drainage Positive maxillary and frontal sinus pain/pressure. Occasionally she can feel it in her teeth. But no other oral or dental symptoms No sore throat. Has occasional sneezing and sinus congestion. She has some seasonal allergies but not using any medicine for that currently.  +  Cough, nonproductive, keeps her up at night No wheezing Mild chest congestion No hemoptysis No shortness of breath No pleuritic pain  No itchy/red eyes No earache, but has mild ear pressure bilaterally. No drainage.  No nausea No vomiting No abdominal pain No diarrhea  No skin rashes +  Fatigue No myalgias No current headache , denies focal neurologic symptoms. A few days ago was lightheaded but that resolved.  Past Medical History  Diagnosis Date  . Endometriosis   . Environmental allergies   . Acute meniscal tear of left knee   . History of hepatitis     CMV  04-2012---  NOW RESOLVED  . OA (osteoarthritis) of knee     BILATERAL KNEE  . Wears glasses   . IBS (irritable bowel syndrome)   . TMJ (dislocation of temporomandibular joint)    Past Surgical History  Procedure Laterality Date  . Cesarean section  1984  &  1987  . Ureteral reimplantion Left AGE 35  . Exploratory laparotomy/  left salpingoophorectomy/  ablation endometriosis  04-20-2002  . Combined laparoscopy w/ hysteroscopy  10-31-2005    W/ BX AND ALBATION ENDOMETRIOSIS  . Transthoracic echocardiogram  05-09-2012    NORMAL STUDY/  EF 60-65%/   NO EVIDENCE MVP  . Cysto/  left ureteral stent placement  AS CHILD  . Knee arthroscopy Left 10/21/2013    Procedure: LEFT ARTHROSCOPY KNEE WITH DEBRIDEMENT;  Surgeon: Shelda PalMatthew D Olin, MD;  [facility];  Service: Orthopedics;  Laterality: Left;  . Appendectomy     Family History  Problem Relation Age of Onset  . Hypertension Mother   . Thyroid disease Mother   . Depression Mother   . Diabetes Father   . Cancer Father     lung, leukemia  . COPD Father   . Heart disease Father   . Cancer Maternal Grandfather     COLON   History  Substance Use Topics  . Smoking status: Never Smoker   . Smokeless tobacco: Never Used  . Alcohol Use: No   OB History    Gravida Para Term Preterm AB TAB SAB Ectopic Multiple Living   2 2   0     2     Review of Systems  All other systems reviewed and are negative.   Allergies  Propoxyphene n-acetaminophen; Adhesive; and Bee venom  Home Medications   Prior to Admission medications   Medication Sig Start Date End Date Taking? Authorizing Provider  omeprazole (PRILOSEC) 20 MG capsule Take 20 mg by mouth daily.   Yes Historical Provider, MD  acetaminophen (TYLENOL) 500 MG tablet Take 500 mg by mouth every 6 (  six) hours as needed.    Historical Provider, MD  cefdinir (OMNICEF) 300 MG capsule Take 1 capsule (300 mg total) by mouth 2 (two) times daily. X 10 days 10/17/14   Lajean Manes, MD  fluticasone [facility]) 50 MCG/ACT nasal spray 1 or 2 sprays each nostril twice a day 10/17/14   Lajean Manes, MD  promethazine-codeine [facility] WITH CODEINE) 6.25-10 MG/5ML syrup Take 1-2 teaspoons every 4-6 hours as needed for cough. May cause drowsiness. 10/17/14   Lajean Manes, MD   BP 130/92 mmHg  Pulse 93  Temp(Src) 98.4 F (36.9 C) (Oral)  Resp 16  Ht  (1.575 m)  Wt 170 lb (77.111 kg)  BMI 31.09 kg/m2  SpO2 98% Physical Exam  Constitutional: She is oriented to person, place, and time. She appears well-developed and  well-nourished. No distress.  HENT:  Head: Normocephalic and atraumatic.  Right Ear: Tympanic membrane, external ear and ear canal normal.  Left Ear: Tympanic membrane, external ear and ear canal normal.  Nose: Mucosal edema and rhinorrhea present. Right sinus exhibits maxillary sinus tenderness. Left sinus exhibits maxillary sinus tenderness.  Mouth/Throat: Oropharynx is clear and moist. No oral lesions. No oropharyngeal exudate.  Eyes: Right eye exhibits no discharge. Left eye exhibits no discharge. No scleral icterus.  Neck: Neck supple.  Cardiovascular: Normal rate, regular rhythm and normal heart sounds.   Pulmonary/Chest: Effort normal and breath sounds normal. She has no wheezes. She has no rales.  Few anterior rhonchi, otherwise lungs clear  Abdominal: She exhibits no distension.  Musculoskeletal: She exhibits no edema or tenderness.  Lymphadenopathy:    She has no cervical adenopathy.  Neurological: She is alert and oriented to person, place, and time.  Skin: Skin is warm and dry. No rash noted.  Psychiatric: She has a normal mood and affect.  Nursing note and vitals reviewed.   ED Course  Procedures (including critical care time) Labs Review Labs Reviewed - No data to display  Imaging Review No results found.    MDM   1. Acute recurrent maxillary sinusitis   2. Acute bronchitis, unspecified organism    Treatment options discussed, as well as risks, benefits, alternatives. Patient voiced understanding and agreement with the following plans: New Prescriptions   CEFDINIR (OMNICEF) 300 MG CAPSULE    Take 1 capsule (300 mg total) by mouth 2 (two) times daily. X 10 days   FLUTICASONE (FLONASE) 50 MCG/ACT NASAL SPRAY    1 or 2 sprays each nostril twice a day   PROMETHAZINE-CODEINE (PHENERGAN WITH CODEINE) 6.25-10 MG/5ML SYRUP    Take 1-2 teaspoons every 4-6 hours as needed for cough. May cause drowsiness.   Other symptomatic care discussed. Mucinex D every morning (she  has taken this in the past without any cardiorespiratory side effects) Follow-up with your primary care doctor in 5-7 days if not improving, or sooner if symptoms become worse. Precautions discussed. Red flags discussed. Questions invited and answered. Patient voiced understanding and agreement.      Lajean Manes, MD 10/17/14 [id number]

## 2014-10-17 NOTE — ED Notes (Signed)
Pt c/o nasal congestion, cough, and sinus pressure x 5 wks. Denies fever.

## 2014-12-28 ENCOUNTER — Telehealth: Payer: Self-pay | Admitting: *Deleted

## 2014-12-28 NOTE — Telephone Encounter (Signed)
Rx faxed to [facility] at [phone], pt aware

## 2014-12-28 NOTE — Telephone Encounter (Signed)
Pt is due to have vitamin d level rechecked asked hormone level could be checked as well? C/o hot flashes daily now. Please advise

## 2014-12-28 NOTE — Telephone Encounter (Signed)
Okay for vitamin D, TSH and FSH

## 2015-01-17 ENCOUNTER — Other Ambulatory Visit: Payer: Self-pay | Admitting: Gynecology

## 2015-01-17 LAB — TSH: TSH: 1.796 u[IU]/mL (ref 0.350–4.500)

## 2015-01-18 LAB — FOLLICLE STIMULATING HORMONE: FSH: 46 m[IU]/mL

## 2015-01-18 LAB — VITAMIN D 25 HYDROXY (VIT D DEFICIENCY, FRACTURES): Vit D, 25-Hydroxy: 22 ng/mL — ABNORMAL LOW (ref 30–100)

## 2015-01-19 ENCOUNTER — Other Ambulatory Visit: Payer: Self-pay | Admitting: Gynecology

## 2015-01-19 DIAGNOSIS — E559 Vitamin D deficiency, unspecified: Secondary | ICD-10-CM

## 2015-01-19 MED ORDER — VITAMIN D (ERGOCALCIFEROL) 1.25 MG (50000 UNIT) PO CAPS: 50000.0000 [IU] | ORAL_CAPSULE | ORAL | Status: DC

## 2015-07-03 ENCOUNTER — Ambulatory Visit (INDEPENDENT_AMBULATORY_CARE_PROVIDER_SITE_OTHER): Payer: PRIVATE HEALTH INSURANCE | Admitting: Family Medicine

## 2015-07-03 ENCOUNTER — Encounter: Payer: Self-pay | Admitting: Family Medicine

## 2015-07-03 VITALS — BP 122/80 | HR 72 | Wt 167.0 lb

## 2015-07-03 DIAGNOSIS — R51 Headache: Secondary | ICD-10-CM | POA: Diagnosis not present

## 2015-07-03 DIAGNOSIS — R519 Headache, unspecified: Secondary | ICD-10-CM | POA: Insufficient documentation

## 2015-07-03 LAB — CBC WITH DIFFERENTIAL/PLATELET
Basophils Absolute: 0 10*3/uL (ref 0.0–0.1)
Basophils Relative: 0 % (ref 0–1)
EOS PCT: 1 % (ref 0–5)
Eosinophils Absolute: 0.1 10*3/uL (ref 0.0–0.7)
HEMATOCRIT: 41.6 % (ref 36.0–46.0)
Hemoglobin: 13.9 g/dL (ref 12.0–15.0)
LYMPHS ABS: 3 10*3/uL (ref 0.7–4.0)
LYMPHS PCT: 37 % (ref 12–46)
MCH: 29.5 pg (ref 26.0–34.0)
MCHC: 33.4 g/dL (ref 30.0–36.0)
MCV: 88.3 fL (ref 78.0–100.0)
MONO ABS: 0.4 10*3/uL (ref 0.1–1.0)
MPV: 9.4 fL (ref 8.6–12.4)
Monocytes Relative: 5 % (ref 3–12)
Neutro Abs: 4.6 10*3/uL (ref 1.7–7.7)
Neutrophils Relative %: 57 % (ref 43–77)
Platelets: 255 10*3/uL (ref 150–400)
RBC: 4.71 MIL/uL (ref 3.87–5.11)
RDW: 13.1 % (ref 11.5–15.5)
WBC: 8 10*3/uL (ref 4.0–10.5)

## 2015-07-03 NOTE — Patient Instructions (Signed)
Thank you for coming in today. Go to the emergency room if your headache becomes excruciating or you have weakness or numbness or uncontrolled vomiting.   General Headache Without Cause A headache is pain or discomfort felt around the head or neck area. The specific cause of a headache may not be found. There are many causes and types of headaches. A few common ones are:  Tension headaches.  Migraine headaches.  Cluster headaches.  Chronic daily headaches. HOME CARE INSTRUCTIONS  Watch your condition for any changes. Take these steps to help with your condition: Managing Pain  Take over-the-counter and prescription medicines only as told by your health care provider.  Lie down in a dark, quiet room when you have a headache.  If directed, apply ice to the head and neck area:  Put ice in a plastic bag.  Place a towel between your skin and the bag.  Leave the ice on for 20 minutes, 2-3 times per day.  Use a heating pad or hot shower to apply heat to the head and neck area as told by your health care provider.  Keep lights dim if bright lights bother you or make your headaches worse. Eating and Drinking  Eat meals on a regular schedule.  Limit alcohol use.  Decrease the amount of caffeine you drink, or stop drinking caffeine. General Instructions  Keep all follow-up visits as told by your health care provider. This is important.  Keep a headache journal to help find out what may trigger your headaches. For example, write down:  What you eat and drink.  How much sleep you get.  Any change to your diet or medicines.  Try massage or other relaxation techniques.  Limit stress.  Sit up straight, and do not tense your muscles.  Do not use tobacco products, including cigarettes, chewing tobacco, or e-cigarettes. If you need help quitting, ask your health care provider.  Exercise regularly as told by your health care provider.  Sleep on a regular schedule. Get 7-9  hours of sleep, or the amount recommended by your health care provider. SEEK MEDICAL CARE IF:   Your symptoms are not helped by medicine.  You have a headache that is different from the usual headache.  You have nausea or you vomit.  You have a fever. SEEK IMMEDIATE MEDICAL CARE IF:   Your headache becomes severe.  You have repeated vomiting.  You have a stiff neck.  You have a loss of vision.  You have problems with speech.  You have pain in the eye or ear.  You have muscular weakness or loss of muscle control.  You lose your balance or have trouble walking.  You feel faint or pass out.  You have confusion.   This information is not intended to replace advice given to you by your health care provider. Make sure you discuss any questions you have with your health care provider.   Document Released: 08/04/2005 Document Revised: 04/25/2015 Document Reviewed: 11/27/2014 Elsevier Interactive Patient Education [employer]

## 2015-07-03 NOTE — Assessment & Plan Note (Addendum)
Stable. Unknown etiology. Could be migraine vs  Caffeine-related vs tension headache. Will check CBC, CMP, TSH to evaluate for occult etiology. Temporal arteritis considered given age, palpable pain, will get ESR and CK. Patient mostly wanted reassurance that something sinister wasn't the cause which was provided. Discussed with patient merits of CT scan being less than risk of exposing her to radiation at this point, will follow up in 2 weeks for further planning.

## 2015-07-03 NOTE — Progress Notes (Signed)
Jessica Mullen is a 54 y.o. female who [facility]  today for headache.  Patient first noted sharp pain in left parietal/temoral region 2 weeks ago. This has been constant and gradual, progressively worsening since then with the last 2 days being the worst at 5/10 intermittent sharp pain with underlying constant pain. She notes she has had tension, sinus, post-menopause headaches in past, none feel similar to this episode. Patient does not blurry vision last week but this resolved when she changed contacts. Patient notes she had CMV in past, this headache is different. She has tried altering caffeine, food intake as well as one week of APAP and OTC cold/sinus medication which have not helped. Presents today for reassurance something serious is not going on. Patient denies nausea, vomiting, photo/phonophobia, aural symptoms, tension, recent illness or sick contacts, rhinorrhea, cough. She has not had any neurological symptoms such as weakness or numbness, particularly on the right side. Patient denies jaw claudication or pain, noting she has history of TMJ dysfunction. Patient notes she has had decreased appetite yesterday, citing she could not drink her daily diet mountain dew.    Past Medical History  Diagnosis Date  . Endometriosis   . Environmental allergies   . Acute meniscal tear of left knee   . History of hepatitis     CMV  04-2012---  NOW RESOLVED  . OA (osteoarthritis) of knee     BILATERAL KNEE  . Wears glasses   . IBS (irritable bowel syndrome)   . TMJ (dislocation of temporomandibular joint)    Past Surgical History  Procedure Laterality Date  . Cesarean section  1984  &  1987  . Ureteral reimplantion Left AGE 59  . Exploratory laparotomy/  left salpingoophorectomy/  ablation endometriosis  04-20-2002  . Combined laparoscopy w/ hysteroscopy  10-31-2005    W/ BX AND ALBATION ENDOMETRIOSIS  . Transthoracic echocardiogram   05-09-2012    NORMAL STUDY/  EF 60-65%/  NO EVIDENCE MVP  . Cysto/  left ureteral stent placement  AS CHILD  . Knee arthroscopy Left 10/21/2013    Procedure: LEFT ARTHROSCOPY KNEE WITH DEBRIDEMENT;  Surgeon: Shelda PalMatthew D Olin, MD;  [facility] [place];  Service: Orthopedics;  Laterality: Left;  . Appendectomy     Social History  Substance Use Topics  . Smoking status: Never Smoker   . Smokeless tobacco: Never Used  . Alcohol Use: No   family history includes COPD in her father; Cancer in her father and maternal grandfather; Depression in her mother; Diabetes in her father; Heart disease in her father; Hypertension in her mother; Thyroid disease in her mother.  ROS as above Medications: Current Outpatient Prescriptions  Medication Sig Dispense Refill  . acetaminophen (TYLENOL) 500 MG tablet Take 500 mg by mouth every 6 (six) hours as needed.    Marland Kitchen. omeprazole (PRILOSEC) 20 MG capsule Take 20 mg by mouth daily.    . Vitamin D, Ergocalciferol, (DRISDOL) 50000 UNITS CAPS capsule Take 1 capsule (50,000 Units total) by mouth every 7 (seven) days. (Patient not taking: Reported on 07/03/2015) 12 capsule 0   No current facility-administered medications for this visit.   Allergies  Allergen Reactions  . Propoxyphene N-Acetaminophen Shortness Of Breath    REACTION: Suppreses Respirations  . Adhesive [Tape] Other (See Comments)    "BLOOD BLISTERS"  . Bee Venom     "BEE STINGS"     Exam:  BP 122/80 mmHg  Pulse 72  Wt 167 lb (75.751 kg) Gen: Well NAD non-toxic appearing HEENT: EOMI,  MMM, pain mild reproducible with palpation of the left parietal region. Normal dentition throughout. Sinuses are nontender. Lungs: Normal work of breathing. CTABL Heart: RRR no MRG Abd: NABS, Soft. Nondistended, Nontender Exts: Brisk capillary refill, warm and well perfused.  MSK: Full ROM, 5/5 strength x4 extremities.  Neuro: CN 2-12 intact to detailed exam. Strength and light touch sensation  intact x 4 extremities.  Normal gait, speech and thought process. Normal coordination.  No results found for this or any previous visit (from the past 24 hour(s)). No results found.   Please see individual assessment and plan sections.

## 2015-07-04 LAB — COMPREHENSIVE METABOLIC PANEL
ALK PHOS: 69 U/L (ref 33–130)
ALT: 14 U/L (ref 6–29)
AST: 16 U/L (ref 10–35)
Albumin: 4.4 g/dL (ref 3.6–5.1)
BILIRUBIN TOTAL: 0.4 mg/dL (ref 0.2–1.2)
BUN: 21 mg/dL (ref 7–25)
CO2: 30 mmol/L (ref 20–31)
Calcium: 9.5 mg/dL (ref 8.6–10.4)
Chloride: 103 mmol/L (ref 98–110)
Creat: 0.67 mg/dL (ref 0.50–1.05)
GLUCOSE: 76 mg/dL (ref 65–99)
Potassium: 4.3 mmol/L (ref 3.5–5.3)
Sodium: 140 mmol/L (ref 135–146)
Total Protein: 7.1 g/dL (ref 6.1–8.1)

## 2015-07-04 LAB — TSH: TSH: 3.139 u[IU]/mL (ref 0.350–4.500)

## 2015-07-04 LAB — CK: Total CK: 126 U/L (ref 7–177)

## 2015-07-04 LAB — SEDIMENTATION RATE: Sed Rate: 11 mm/hr (ref 0–30)

## 2015-07-04 NOTE — Progress Notes (Signed)
Quick Note:  Labs were normal. No sign of temporal arteritis. Continue the watch headache. ______

## 2015-10-29 ENCOUNTER — Ambulatory Visit: Payer: PRIVATE HEALTH INSURANCE | Admitting: Family Medicine

## 2015-10-29 ENCOUNTER — Encounter: Payer: Self-pay | Admitting: Family Medicine

## 2015-10-29 ENCOUNTER — Ambulatory Visit (INDEPENDENT_AMBULATORY_CARE_PROVIDER_SITE_OTHER): Payer: Managed Care, Other (non HMO) | Admitting: Family Medicine

## 2015-10-29 VITALS — BP 121/84 | HR 84 | Temp 98.0°F | Wt 176.0 lb

## 2015-10-29 DIAGNOSIS — B029 Zoster without complications: Secondary | ICD-10-CM

## 2015-10-29 DIAGNOSIS — K2970 Gastritis, unspecified, without bleeding: Secondary | ICD-10-CM | POA: Diagnosis not present

## 2015-10-29 DIAGNOSIS — K2990 Gastroduodenitis, unspecified, without bleeding: Secondary | ICD-10-CM | POA: Diagnosis not present

## 2015-10-29 MED ORDER — OMEPRAZOLE 40 MG PO CPDR: 40.0000 mg | DELAYED_RELEASE_CAPSULE | Freq: Every day | ORAL | Status: DC

## 2015-10-29 NOTE — Patient Instructions (Signed)
Thank you for coming in today. STOP valtrex.  Start omeprazole.  Get labs today.  If your belly pain worsens, or you have high fever, bad vomiting, blood in your stool or black tarry stool go to the Emergency Room.   Gastritis, Adult Gastritis is soreness and swelling (inflammation) of the lining of the stomach. Gastritis can develop as a sudden onset (acute) or long-term (chronic) condition. If gastritis is not treated, it can lead to stomach bleeding and ulcers. CAUSES  Gastritis occurs when the stomach lining is weak or damaged. Digestive juices from the stomach then inflame the weakened stomach lining. The stomach lining may be weak or damaged due to viral or bacterial infections. One common bacterial infection is the Helicobacter pylori infection. Gastritis can also result from excessive alcohol consumption, taking certain medicines, or having too much acid in the stomach.  SYMPTOMS  In some cases, there are no symptoms. When symptoms are present, they may include:  Pain or a burning sensation in the upper abdomen.  Nausea.  Vomiting.  An uncomfortable feeling of fullness after eating. DIAGNOSIS  Your caregiver may suspect you have gastritis based on your symptoms and a physical exam. To determine the cause of your gastritis, your caregiver may perform the following:  Blood or stool tests to check for the H pylori bacterium.  Gastroscopy. A thin, flexible tube (endoscope) is passed down the esophagus and into the stomach. The endoscope has a light and camera on the end. Your caregiver uses the endoscope to view the inside of the stomach.  Taking a tissue sample (biopsy) from the stomach to examine under a microscope. TREATMENT  Depending on the cause of your gastritis, medicines may be prescribed. If you have a bacterial infection, such as an H pylori infection, antibiotics may be given. If your gastritis is caused by too much acid in the stomach, H2 blockers or antacids may be  given. Your caregiver may recommend that you stop taking aspirin, ibuprofen, or other nonsteroidal anti-inflammatory drugs (NSAIDs). HOME CARE INSTRUCTIONS  Only take over-the-counter or prescription medicines as directed by your caregiver.  If you were given antibiotic medicines, take them as directed. Finish them even if you start to feel better.  Drink enough fluids to keep your urine clear or pale yellow.  Avoid foods and drinks that make your symptoms worse, such as:  Caffeine or alcoholic drinks.  Chocolate.  Peppermint or mint flavorings.  Garlic and onions.  Spicy foods.  Citrus fruits, such as oranges, lemons, or limes.  Tomato-based foods such as sauce, chili, salsa, and pizza.  Fried and fatty foods.  Eat small, frequent meals instead of large meals. SEEK IMMEDIATE MEDICAL CARE IF:   You have black or dark red stools.  You vomit blood or material that looks like coffee grounds.  You are unable to keep fluids down.  Your abdominal pain gets worse.  You have a fever.  You do not feel better after 1 week.  You have any other questions or concerns. MAKE SURE YOU:  Understand these instructions.  Will watch your condition.  Will get help right away if you are not doing well or get worse.   This information is not intended to replace advice given to you by your health care provider. Make sure you discuss any questions you have with your health care provider.   Document Released: 07/29/2001 Document Revised: 02/03/2012 Document Reviewed: 09/17/2011 Elsevier Interactive Patient Education [employer]2016 Elsevier Inc.

## 2015-10-29 NOTE — Progress Notes (Signed)
Jessica SacramentoKathryn A Mullen is a 55 y.o. female who [facility] today for tables and nausea. Patient developed left low back pain burning pain and rash. It was diagnosed as shingles by another provider. She was started on valacyclovir 1 g 3 times daily. This was 5 days ago. She notes the pain and rash are both resolving. However over the last few days she's noted upper abdominal burning and nausea. She denies any vomiting or diarrhea. Food makes her symptoms worse. Her abdominal symptoms are consistent with previous episodes of gastritis. She denies any aspirin or NSAIDs. No fevers or chills. No black or tarry stool. No blood in the stool.   Past Medical History  Diagnosis Date  . Endometriosis   . Environmental allergies   . Acute meniscal tear of left knee   . History of hepatitis     CMV  04-2012---  NOW RESOLVED  . OA (osteoarthritis) of knee     BILATERAL KNEE  . Wears glasses   . IBS (irritable bowel syndrome)   . TMJ (dislocation of temporomandibular joint)    Past Surgical History  Procedure Laterality Date  . Cesarean section  1984  &  1987  . Ureteral reimplantion Left AGE 56  . Exploratory laparotomy/  left salpingoophorectomy/  ablation endometriosis  04-20-2002  . Combined laparoscopy w/ hysteroscopy  10-31-2005    W/ BX AND ALBATION ENDOMETRIOSIS  . Transthoracic echocardiogram  05-09-2012    NORMAL STUDY/  EF 60-65%/  NO EVIDENCE MVP  . Cysto/  left ureteral stent placement  AS CHILD  . Knee arthroscopy Left 10/21/2013    Procedure: LEFT ARTHROSCOPY KNEE WITH DEBRIDEMENT;  Surgeon: Shelda PalMatthew D Olin, MD;  [facility] [place];  Service: Orthopedics;  Laterality: Left;  . Appendectomy     Social History  Substance Use Topics  . Smoking status: Never Smoker   . Smokeless tobacco: Never Used  . Alcohol Use: No   family history includes COPD in her  father; Cancer in her father and maternal grandfather; Depression in her mother; Diabetes in her father; Heart disease in her father; Hypertension in her mother; Thyroid disease in her mother.  ROS as above Medications: Current Outpatient Prescriptions  Medication Sig Dispense Refill  . acetaminophen (TYLENOL) 500 MG tablet Take 500 mg by mouth every 6 (six) hours as needed.    Marland Kitchen. omeprazole (PRILOSEC) 40 MG capsule Take 1 capsule (40 mg total) by mouth daily. 30 capsule 3  . Vitamin D, Ergocalciferol, (DRISDOL) 50000 UNITS CAPS capsule Take 1 capsule (50,000 Units total) by mouth every 7 (seven) days. (Patient not taking: Reported on 07/03/2015) 12 capsule 0   No current facility-administered medications for this visit.   Allergies  Allergen Reactions  . Propoxyphene N-Acetaminophen Shortness Of Breath    REACTION: Suppreses Respirations  . Adhesive [Tape] Other (See Comments)    "BLOOD BLISTERS"  . Bee Venom     "BEE STINGS"     Exam:  BP 121/84 mmHg  Pulse 84  Temp(Src) 98 F (36.7 C) (Oral)  Wt 176 lb (79.833 kg)  SpO2 99% Gen: Well NAD Nontoxic appearing HEENT: EOMI,  MMM Lungs: Normal work of breathing. CTABL Heart: RRR no MRG Abd: NABS, Soft. Nondistended, Nontender no masses or rebound or guarding. Exts: Brisk capillary refill, warm and well perfused.  Skin: Left low back small area in a dermatomal pattern of grouped crusted vesicles. The rash does not  cross the midline.  No results found for this or any previous visit (from the past 24 hour(s)). No results found.   55 year old woman with shingles and gastritis.   Shingles seems to be resolving with valacyclovir. However I believe she is either intolerant and suffering from the side effects of valacyclovir or from independent an unrelated gastritis. A she is improving plan to stop the valacyclovir and start omeprazole. Additionally obtain CBC CMP and lipase. Follow-up with PCP in the near future. I am unsure about  the benefit of VZV vaccination in the near future.

## 2015-10-30 LAB — COMPREHENSIVE METABOLIC PANEL
ALK PHOS: 69 U/L (ref 33–130)
ALT: 20 U/L (ref 6–29)
AST: 16 U/L (ref 10–35)
Albumin: 4.4 g/dL (ref 3.6–5.1)
BILIRUBIN TOTAL: 0.4 mg/dL (ref 0.2–1.2)
BUN: 19 mg/dL (ref 7–25)
CO2: 27 mmol/L (ref 20–31)
Calcium: 9.4 mg/dL (ref 8.6–10.4)
Chloride: 102 mmol/L (ref 98–110)
Creat: 0.66 mg/dL (ref 0.50–1.05)
Glucose, Bld: 104 mg/dL — ABNORMAL HIGH (ref 65–99)
POTASSIUM: 4.3 mmol/L (ref 3.5–5.3)
Sodium: 138 mmol/L (ref 135–146)
TOTAL PROTEIN: 7.1 g/dL (ref 6.1–8.1)

## 2015-10-30 LAB — CBC
HCT: 42.5 % (ref 36.0–46.0)
Hemoglobin: 14.4 g/dL (ref 12.0–15.0)
MCH: 29.8 pg (ref 26.0–34.0)
MCHC: 33.9 g/dL (ref 30.0–36.0)
MCV: 87.8 fL (ref 78.0–100.0)
MPV: 9.1 fL (ref 8.6–12.4)
PLATELETS: 259 10*3/uL (ref 150–400)
RBC: 4.84 MIL/uL (ref 3.87–5.11)
RDW: 12.9 % (ref 11.5–15.5)
WBC: 9.4 10*3/uL (ref 4.0–10.5)

## 2015-10-30 LAB — LIPASE: LIPASE: 21 U/L (ref 7–60)

## 2015-10-30 NOTE — Progress Notes (Signed)
Quick Note:  Normal, no changes. ______ 

## 2015-12-25 ENCOUNTER — Emergency Department (INDEPENDENT_AMBULATORY_CARE_PROVIDER_SITE_OTHER)
Admission: EM | Admit: 2015-12-25 | Discharge: 2015-12-25 | Disposition: A | Discharge status: Home / Self Care | Payer: Managed Care, Other (non HMO) | Source: Home / Self Care | Attending: Emergency Medicine | Admitting: Emergency Medicine

## 2015-12-25 ENCOUNTER — Encounter: Payer: Self-pay | Admitting: Emergency Medicine

## 2015-12-25 DIAGNOSIS — N390 Urinary tract infection, site not specified: Secondary | ICD-10-CM | POA: Diagnosis not present

## 2015-12-25 DIAGNOSIS — R319 Hematuria, unspecified: Secondary | ICD-10-CM

## 2015-12-25 LAB — POCT URINALYSIS DIP (MANUAL ENTRY)
Bilirubin, UA: NEGATIVE
Glucose, UA: NEGATIVE
Ketones, POC UA: NEGATIVE
NITRITE UA: NEGATIVE
PH UA: 5.5 (ref 5–8)
Protein Ur, POC: 30 — AB
Spec Grav, UA: 1.015 (ref 1.005–1.03)
Urobilinogen, UA: 0.2 (ref 0–1)

## 2015-12-25 MED ORDER — HYDROCODONE-ACETAMINOPHEN 5-325 MG PO TABS: 2.0000 | ORAL_TABLET | ORAL | Status: DC | PRN

## 2015-12-25 MED ORDER — CEPHALEXIN 500 MG PO CAPS: 500.0000 mg | ORAL_CAPSULE | Freq: Four times a day (QID) | ORAL | Status: DC

## 2015-12-25 NOTE — ED Notes (Signed)
Pt c/o right flank pain that started yesterday. Low grade temp today.

## 2015-12-25 NOTE — Discharge Instructions (Signed)

## 2015-12-25 NOTE — ED Provider Notes (Signed)
CSN: [id number]     Arrival date & time 12/25/15  1743 History   First MD Initiated Contact with Patient 12/25/15 1759     Chief Complaint  Patient presents with  . Flank Pain   (Consider location/radiation/quality/duration/timing/severity/associated sxs/prior Treatment) Patient is a 55 y.o. female presenting with flank pain. No language interpreter was used.  Flank Pain This is a new problem. The current episode started yesterday. The problem occurs constantly. The problem has been gradually worsening. Associated symptoms include abdominal pain. Pertinent negatives include no shortness of breath. Nothing aggravates the symptoms. Nothing relieves the symptoms. She has tried nothing for the symptoms. The treatment provided no relief.  Pt complains of right flank pain.  Pt reports pain started yesterday.  Pt complains of lower abdominal discomfort.  Pt has a urine culture pending.  Pt is employee at [employer].  Past Medical History  Diagnosis Date  . Endometriosis   . Environmental allergies   . Acute meniscal tear of left knee   . History of hepatitis     CMV  04-2012---  NOW RESOLVED  . OA (osteoarthritis) of knee     BILATERAL KNEE  . Wears glasses   . IBS (irritable bowel syndrome)   . TMJ (dislocation of temporomandibular joint)    Past Surgical History  Procedure Laterality Date  . Cesarean section  1984  &  1987  . Ureteral reimplantion Left AGE 45  . Exploratory laparotomy/  left salpingoophorectomy/  ablation endometriosis  04-20-2002  . Combined laparoscopy w/ hysteroscopy  10-31-2005    W/ BX AND ALBATION ENDOMETRIOSIS  . Transthoracic echocardiogram  05-09-2012    NORMAL STUDY/  EF 60-65%/  NO EVIDENCE MVP  . Cysto/  left ureteral stent placement  AS CHILD  . Knee arthroscopy Left 10/21/2013    Procedure: LEFT ARTHROSCOPY KNEE WITH DEBRIDEMENT;  Surgeon: Shelda PalMatthew D Olin, MD;  [facility] [place];  Service: Orthopedics;  Laterality: Left;  .  Appendectomy     Family History  Problem Relation Age of Onset  . Hypertension Mother   . Thyroid disease Mother   . Depression Mother   . Diabetes Father   . Cancer Father     lung, leukemia  . COPD Father   . Heart disease Father   . Cancer Maternal Grandfather     COLON   Social History  Substance Use Topics  . Smoking status: Never Smoker   . Smokeless tobacco: Never Used  . Alcohol Use: No   OB History    Gravida Para Term Preterm AB TAB SAB Ectopic Multiple Living   2 2   0     2     Review of Systems  Respiratory: Negative for shortness of breath.   Gastrointestinal: Positive for abdominal pain.  Genitourinary: Positive for flank pain.  All other systems reviewed and are negative.   Allergies  Propoxyphene n-acetaminophen; Adhesive; and Bee venom  Home Medications   Prior to Admission medications   Medication Sig Start Date End Date Taking? Authorizing Provider  acetaminophen (TYLENOL) 500 MG tablet Take 500 mg by mouth every 6 (six) hours as needed.    Historical Provider, MD  cephALEXin (KEFLEX) 500 MG capsule Take 1 capsule (500 mg total) by mouth 4 (four) times daily. 12/25/15   Elson AreasLeslie K Cerena Baine, PA-C  HYDROcodone-acetaminophen (NORCO/VICODIN) 5-325 MG tablet Take 2 tablets by mouth every 4 (four) hours as needed. 12/25/15   Elson AreasLeslie K Naysa Puskas, PA-C  omeprazole (PRILOSEC) 40  MG capsule Take 1 capsule (40 mg total) by mouth daily. 10/29/15   Rodolph Bong, MD  Vitamin D, Ergocalciferol, (DRISDOL) 50000 UNITS CAPS capsule Take 1 capsule (50,000 Units total) by mouth every 7 (seven) days. Patient not taking: Reported on 07/03/2015 01/19/15   Dara Lords, MD   Meds Ordered and Administered this Visit  Medications - No data to display  BP 128/69 mmHg  Pulse 84  Temp(Src) 99.4 F (37.4 C) (Oral)  Wt 165 lb (74.844 kg)  SpO2 99% No data found.   Physical Exam  Constitutional: She is oriented to person, place, and time. She appears well-developed and  well-nourished.  HENT:  Head: Normocephalic.  Eyes: EOM are normal.  Neck: Normal range of motion.  Cardiovascular: Normal rate.   Pulmonary/Chest: Effort normal.  Abdominal: She exhibits no distension.  nontender abdomen.    Musculoskeletal: Normal range of motion.  Neurological: She is alert and oriented to person, place, and time.  Psychiatric: She has a normal mood and affect.  Nursing note and vitals reviewed.   ED Course  Procedures (including critical care time)  Labs Review Labs Reviewed  POCT URINALYSIS DIP (MANUAL ENTRY) - Abnormal; Notable for the following:    Blood, UA large (*)    Protein Ur, POC =30 (*)    Leukocytes, UA small (1+) (*)    All other components within normal limits    Imaging Review No results found.   Visual Acuity Review  Right Eye Distance:   Left Eye Distance:   Bilateral Distance:    Right Eye Near:   Left Eye Near:    Bilateral Near:         MDM Pt has uti symptoms, blood in urine.  Pt might have a kidney stone.  Pt counseled on symptoms.  If pain worsens or changes, she needs to go to [facility] ED for evaluation.    1. Urinary tract infection with hematuria, site unspecified    Meds ordered this encounter  Medications  . cephALEXin (KEFLEX) 500 MG capsule    Sig: Take 1 capsule (500 mg total) by mouth 4 (four) times daily.    Dispense:  40 capsule    Refill:  0    Order Specific Question:  Supervising Provider    Answer:  Georgina Pillion, DAVID [[id number]]  . HYDROcodone-acetaminophen (NORCO/VICODIN) 5-325 MG tablet    Sig: Take 2 tablets by mouth every 4 (four) hours as needed.    Dispense:  20 tablet    Refill:  0    Order Specific Question:  Supervising Provider    Answer:  Georgina Pillion, DAVID [[id number]]    An After Visit Summary was printed and given to the patient.  Lonia Skinner [place], PA-C 12/25/15 [phone]

## 2015-12-26 ENCOUNTER — Telehealth: Payer: Self-pay | Admitting: *Deleted

## 2015-12-26 DIAGNOSIS — R1030 Lower abdominal pain, unspecified: Secondary | ICD-10-CM

## 2015-12-26 NOTE — Telephone Encounter (Signed)
jpt called and stated that she was seen in UC and was told to f/u and get a CT and recheck of urine kidney stones. Order placed.Heath GoldBarkley, Noelle Sease Lynetta'

## 2015-12-28 ENCOUNTER — Other Ambulatory Visit: Payer: Self-pay

## 2015-12-28 DIAGNOSIS — R1030 Lower abdominal pain, unspecified: Secondary | ICD-10-CM

## 2015-12-29 ENCOUNTER — Ambulatory Visit (HOSPITAL_BASED_OUTPATIENT_CLINIC_OR_DEPARTMENT_OTHER)
Admission: RE | Admit: 2015-12-29 | Discharge: 2015-12-29 | Disposition: A | Discharge status: Home / Self Care | Payer: Managed Care, Other (non HMO) | Source: Ambulatory Visit | Attending: Family Medicine | Admitting: Family Medicine

## 2015-12-29 DIAGNOSIS — R1030 Lower abdominal pain, unspecified: Secondary | ICD-10-CM | POA: Insufficient documentation

## 2016-02-20 ENCOUNTER — Other Ambulatory Visit: Payer: Self-pay | Admitting: Gynecology

## 2016-02-20 DIAGNOSIS — Z139 Encounter for screening, unspecified: Secondary | ICD-10-CM

## 2016-02-29 ENCOUNTER — Ambulatory Visit: Payer: Managed Care, Other (non HMO)

## 2016-03-06 ENCOUNTER — Ambulatory Visit
Admission: RE | Admit: 2016-03-06 | Discharge: 2016-03-06 | Disposition: A | Discharge status: Home / Self Care | Payer: Managed Care, Other (non HMO) | Source: Ambulatory Visit | Attending: Gynecology | Admitting: Gynecology

## 2016-03-06 DIAGNOSIS — Z139 Encounter for screening, unspecified: Secondary | ICD-10-CM

## 2016-03-11 ENCOUNTER — Ambulatory Visit: Payer: Managed Care, Other (non HMO) | Admitting: Osteopathic Medicine

## 2016-04-16 ENCOUNTER — Encounter: Payer: No Typology Code available for payment source | Admitting: Gynecology

## 2016-04-16 ENCOUNTER — Ambulatory Visit (INDEPENDENT_AMBULATORY_CARE_PROVIDER_SITE_OTHER): Payer: Managed Care, Other (non HMO) | Admitting: Gynecology

## 2016-04-16 ENCOUNTER — Encounter: Payer: Self-pay | Admitting: Gynecology

## 2016-04-16 ENCOUNTER — Other Ambulatory Visit: Payer: Self-pay | Admitting: Gynecology

## 2016-04-16 VITALS — BP 116/72 | Ht 62.0 in | Wt 169.0 lb

## 2016-04-16 DIAGNOSIS — N951 Menopausal and female climacteric states: Secondary | ICD-10-CM | POA: Diagnosis not present

## 2016-04-16 DIAGNOSIS — Z1321 Encounter for screening for nutritional disorder: Secondary | ICD-10-CM

## 2016-04-16 DIAGNOSIS — Z1329 Encounter for screening for other suspected endocrine disorder: Secondary | ICD-10-CM

## 2016-04-16 DIAGNOSIS — Z13220 Encounter for screening for lipoid disorders: Secondary | ICD-10-CM

## 2016-04-16 DIAGNOSIS — L723 Sebaceous cyst: Secondary | ICD-10-CM

## 2016-04-16 DIAGNOSIS — N952 Postmenopausal atrophic vaginitis: Secondary | ICD-10-CM

## 2016-04-16 DIAGNOSIS — Z01419 Encounter for gynecological examination (general) (routine) without abnormal findings: Secondary | ICD-10-CM

## 2016-04-16 DIAGNOSIS — N941 Unspecified dyspareunia: Secondary | ICD-10-CM | POA: Diagnosis not present

## 2016-04-16 LAB — COMPREHENSIVE METABOLIC PANEL
ALK PHOS: 69 U/L (ref 33–130)
ALT: 11 U/L (ref 6–29)
AST: 15 U/L (ref 10–35)
Albumin: 4.3 g/dL (ref 3.6–5.1)
BUN: 18 mg/dL (ref 7–25)
CALCIUM: 9.5 mg/dL (ref 8.6–10.4)
CHLORIDE: 102 mmol/L (ref 98–110)
CO2: 27 mmol/L (ref 20–31)
Creat: 0.89 mg/dL (ref 0.50–1.05)
GLUCOSE: 98 mg/dL (ref 65–99)
POTASSIUM: 3.9 mmol/L (ref 3.5–5.3)
Sodium: 140 mmol/L (ref 135–146)
Total Bilirubin: 0.5 mg/dL (ref 0.2–1.2)
Total Protein: 7 g/dL (ref 6.1–8.1)

## 2016-04-16 LAB — CBC WITH DIFFERENTIAL/PLATELET
BASOS ABS: 0 {cells}/uL (ref 0–200)
BASOS PCT: 0 %
EOS ABS: 93 {cells}/uL (ref 15–500)
Eosinophils Relative: 1 %
HCT: 42.5 % (ref 35.0–45.0)
Hemoglobin: 14.1 g/dL (ref 11.7–15.5)
LYMPHS PCT: 33 %
Lymphs Abs: 3069 cells/uL (ref 850–3900)
MCH: 29.5 pg (ref 27.0–33.0)
MCHC: 33.2 g/dL (ref 32.0–36.0)
MCV: 88.9 fL (ref 80.0–100.0)
MONO ABS: 372 {cells}/uL (ref 200–950)
MONOS PCT: 4 %
MPV: 9.3 fL (ref 7.5–12.5)
NEUTROS PCT: 62 %
Neutro Abs: 5766 cells/uL (ref 1500–7800)
PLATELETS: 257 10*3/uL (ref 140–400)
RBC: 4.78 MIL/uL (ref 3.80–5.10)
RDW: 13.4 % (ref 11.0–15.0)
WBC: 9.3 10*3/uL (ref 3.8–10.8)

## 2016-04-16 LAB — TSH: TSH: 3.29 m[IU]/L

## 2016-04-16 LAB — LIPID PANEL
CHOL/HDL RATIO: 3.5 ratio (ref <=5.0)
Cholesterol: 189 mg/dL (ref 125–200)
HDL: 54 mg/dL (ref >=46)
LDL Cholesterol: 105 mg/dL (ref <130)
Triglycerides: 148 mg/dL (ref <150)
VLDL: 30 mg/dL (ref <30)

## 2016-04-16 MED ORDER — ESTRADIOL 0.075 MG/24HR TD PTTW: 1.0000 | MEDICATED_PATCH | TRANSDERMAL | 12 refills | Status: DC

## 2016-04-16 MED ORDER — PROGESTERONE MICRONIZED 100 MG PO CAPS: 100.0000 mg | ORAL_CAPSULE | Freq: Every day | ORAL | 11 refills | Status: DC

## 2016-04-16 NOTE — Patient Instructions (Signed)
Start on the estrogen patches twice weekly Take the progesterone pill nightly. Call if any issues once starting or any vaginal bleeding.   Call your gastroenterologist to see when they want to do a another colonoscopy.  You may obtain a copy of any labs that were done today by logging onto MyChart as outlined in the instructions provided with your AVS (after visit summary). The office will not call with normal lab results but certainly if there are any significant abnormalities then we will contact you.   Health Maintenance Adopting a healthy lifestyle and getting preventive care can go a long way to promote health and wellness. Talk with your health care provider about what schedule of regular examinations is right for you. This is a good chance for you to check in with your provider about disease prevention and staying healthy. In between checkups, there are plenty of things you can do on your own. Experts have done a lot of research about which lifestyle changes and preventive measures are most likely to keep you healthy. Ask your health care provider for more information. WEIGHT AND DIET  Eat a healthy diet  Be sure to include plenty of vegetables, fruits, low-fat dairy products, and lean protein.  Do not eat a lot of foods high in solid fats, added sugars, or salt.  Get regular exercise. This is one of the most important things you can do for your health.  Most adults should exercise for at least 150 minutes each week. The exercise should increase your heart rate and make you sweat (moderate-intensity exercise).  Most adults should also do strengthening exercises at least twice a week. This is in addition to the moderate-intensity exercise.  Maintain a healthy weight  Body mass index (BMI) is a measurement that can be used to identify possible weight problems. It estimates body fat based on height and weight. Your health care provider can help determine your BMI and help you achieve or  maintain a healthy weight.  For females 1 years of age and older:   A BMI below 18.5 is considered underweight.  A BMI of 18.5 to 24.9 is normal.  A BMI of 25 to 29.9 is considered overweight.  A BMI of 30 and above is considered obese.  Watch levels of cholesterol and blood lipids  You should start having your blood tested for lipids and cholesterol at 53 years of age, then have this test every 5 years.  You may need to have your cholesterol levels checked more often if:  Your lipid or cholesterol levels are high.  You are older than 53 years of age.  You are at high risk for heart disease.  CANCER SCREENING   Lung Cancer  Lung cancer screening is recommended for adults 69-60 years old who are at high risk for lung cancer because of a history of smoking.  A yearly low-dose CT scan of the lungs is recommended for people who:  Currently smoke.  Have quit within the past 15 years.  Have at least a 30-pack-year history of smoking. A pack year is smoking an average of one pack of cigarettes a day for 1 year.  Yearly screening should continue until it has been 15 years since you quit.  Yearly screening should stop if you develop a health problem that would prevent you from having lung cancer treatment.  Breast Cancer  Practice breast self-awareness. This means understanding how your breasts normally appear and feel.  It also means doing regular breast  self-exams. Let your health care provider know about any changes, no matter how small.  If you are in your 20s or 30s, you should have a clinical breast exam (CBE) by a health care provider every 1-3 years as part of a regular health exam.  If you are 53 or older, have a CBE every year. Also consider having a breast X-ray (mammogram) every year.  If you have a family history of breast cancer, talk to your health care provider about genetic screening.  If you are at high risk for breast cancer, talk to your health care  provider about having an MRI and a mammogram every year.  Breast cancer gene (BRCA) assessment is recommended for women who have family members with BRCA-related cancers. BRCA-related cancers include:  Breast.  Ovarian.  Tubal.  Peritoneal cancers.  Results of the assessment will determine the need for genetic counseling and BRCA1 and BRCA2 testing. Cervical Cancer Routine pelvic examinations to screen for cervical cancer are no longer recommended for nonpregnant women who are considered low risk for cancer of the pelvic organs (ovaries, uterus, and vagina) and who do not have symptoms. A pelvic examination may be necessary if you have symptoms including those associated with pelvic infections. Ask your health care provider if a screening pelvic exam is right for you.   The Pap test is the screening test for cervical cancer for women who are considered at risk.  If you had a hysterectomy for a problem that was not cancer or a condition that could lead to cancer, then you no longer need Pap tests.  If you are older than 65 years, and you have had normal Pap tests for the past 10 years, you no longer need to have Pap tests.  If you have had past treatment for cervical cancer or a condition that could lead to cancer, you need Pap tests and screening for cancer for at least 20 years after your treatment.  If you no longer get a Pap test, assess your risk factors if they change (such as having a new sexual partner). This can affect whether you should start being screened again.  Some women have medical problems that increase their chance of getting cervical cancer. If this is the case for you, your health care provider may recommend more frequent screening and Pap tests.  The human papillomavirus (HPV) test is another test that may be used for cervical cancer screening. The HPV test looks for the virus that can cause cell changes in the cervix. The cells collected during the Pap test can be  tested for HPV.  The HPV test can be used to screen women 6 years of age and older. Getting tested for HPV can extend the interval between normal Pap tests from three to five years.  An HPV test also should be used to screen women of any age who have unclear Pap test results.  After 53 years of age, women should have HPV testing as often as Pap tests.  Colorectal Cancer  This type of cancer can be detected and often prevented.  Routine colorectal cancer screening usually begins at 53 years of age and continues through 53 years of age.  Your health care provider may recommend screening at an earlier age if you have risk factors for colon cancer.  Your health care provider may also recommend using home test kits to check for hidden blood in the stool.  A small camera at the end of a tube can be  used to examine your colon directly (sigmoidoscopy or colonoscopy). This is done to check for the earliest forms of colorectal cancer.  Routine screening usually begins at age 27.  Direct examination of the colon should be repeated every 5-10 years through 53 years of age. However, you may need to be screened more often if early forms of precancerous polyps or small growths are found. Skin Cancer  Check your skin from head to toe regularly.  Tell your health care provider about any new moles or changes in moles, especially if there is a change in a mole's shape or color.  Also tell your health care provider if you have a mole that is larger than the size of a pencil eraser.  Always use sunscreen. Apply sunscreen liberally and repeatedly throughout the day.  Protect yourself by wearing long sleeves, pants, a wide-brimmed hat, and sunglasses whenever you are outside. HEART DISEASE, DIABETES, AND HIGH BLOOD PRESSURE   Have your blood pressure checked at least every 1-2 years. High blood pressure causes heart disease and increases the risk of stroke.  If you are between 19 years and 90 years  old, ask your health care provider if you should take aspirin to prevent strokes.  Have regular diabetes screenings. This involves taking a blood sample to check your fasting blood sugar level.  If you are at a normal weight and have a low risk for diabetes, have this test once every three years after 53 years of age.  If you are overweight and have a high risk for diabetes, consider being tested at a younger age or more often. PREVENTING INFECTION  Hepatitis B  If you have a higher risk for hepatitis B, you should be screened for this virus. You are considered at high risk for hepatitis B if:  You were born in a country where hepatitis B is common. Ask your health care provider which countries are considered high risk.  Your parents were born in a high-risk country, and you have not been immunized against hepatitis B (hepatitis B vaccine).  You have HIV or AIDS.  You use needles to inject street drugs.  You live with someone who has hepatitis B.  You have had sex with someone who has hepatitis B.  You get hemodialysis treatment.  You take certain medicines for conditions, including cancer, organ transplantation, and autoimmune conditions. Hepatitis C  Blood testing is recommended for:  Everyone born from [birth year] through 1963.  Anyone with known risk factors for hepatitis C. Sexually transmitted infections (STIs)  You should be screened for sexually transmitted infections (STIs) including gonorrhea and chlamydia if:  You are sexually active and are younger than 53 years of age.  You are older than 53 years of age and your health care provider tells you that you are at risk for this type of infection.  Your sexual activity has changed since you were last screened and you are at an increased risk for chlamydia or gonorrhea. Ask your health care provider if you are at risk.  If you do not have HIV, but are at risk, it may be recommended that you take a prescription medicine  daily to prevent HIV infection. This is called pre-exposure prophylaxis (PrEP). You are considered at risk if:  You are sexually active and do not regularly use condoms or know the HIV status of your partner(s).  You take drugs by injection.  You are sexually active with a partner who has HIV. Talk with your health care  provider about whether you are at high risk of being infected with HIV. If you choose to begin PrEP, you should first be tested for HIV. You should then be tested every 3 months for as long as you are taking PrEP.  PREGNANCY   If you are premenopausal and you may become pregnant, ask your health care provider about preconception counseling.  If you may become pregnant, take 400 to 800 micrograms (mcg) of folic acid every day.  If you want to prevent pregnancy, talk to your health care provider about birth control (contraception). OSTEOPOROSIS AND MENOPAUSE   Osteoporosis is a disease in which the bones lose minerals and strength with aging. This can result in serious bone fractures. Your risk for osteoporosis can be identified using a bone density scan.  If you are 33 years of age or older, or if you are at risk for osteoporosis and fractures, ask your health care provider if you should be screened.  Ask your health care provider whether you should take a calcium or vitamin D supplement to lower your risk for osteoporosis.  Menopause may have certain physical symptoms and risks.  Hormone replacement therapy may reduce some of these symptoms and risks. Talk to your health care provider about whether hormone replacement therapy is right for you.  HOME CARE INSTRUCTIONS   Schedule regular health, dental, and eye exams.  Stay current with your immunizations.   Do not use any tobacco products including cigarettes, chewing tobacco, or electronic cigarettes.  If you are pregnant, do not drink alcohol.  If you are breastfeeding, limit how much and how often you drink  alcohol.  Limit alcohol intake to no more than 1 drink per day for nonpregnant women. One drink equals 12 ounces of beer, 5 ounces of wine, or 1 ounces of hard liquor.  Do not use street drugs.  Do not share needles.  Ask your health care provider for help if you need support or information about quitting drugs.  Tell your health care provider if you often feel depressed.  Tell your health care provider if you have ever been abused or do not feel safe at home. Document Released: 02/17/2011 Document Revised: 12/19/2013 Document Reviewed: 07/06/2013 [facility] Patient Information 2015 [place], [place]. This information is not intended to replace advice given to you by your health care provider. Make sure you discuss any questions you have with your health care provider.

## 2016-04-16 NOTE — Progress Notes (Signed)
    Jessica SacramentoKathryn A Mullen 31-Mar-1961 [id number]        55 y.o.  G2P0002  for annual exam.  Several issues noted below.  Past medical history,surgical history, problem list, medications, allergies, family history and social history were all reviewed and documented as reviewed in the EPIC chart.  ROS:  Performed with pertinent positives and negatives included in the history, assessment and plan.   Additional significant findings :  None   Exam: Jessica PortelaKim Mullen assistant Vitals:   04/16/16 1526  BP: 116/72  Weight: 169 lb (76.7 kg)  Height: 5\' 2"  (1.575 m)   Body mass index is 30.91 kg/m.  General appearance:  Normal affect, orientation and appearance. Skin: Grossly normal HEENT: Without gross lesions.  No cervical or supraclavicular adenopathy. Thyroid normal.  Lungs:  Clear without wheezing, rales or rhonchi Cardiac: RR, without RMG Abdominal:  Soft, nontender, without masses, guarding, rebound, organomegaly or hernia Breasts:  Examined lying and sitting without masses, retractions, discharge or axillary adenopathy. Pelvic:  Ext/BUS/Vagina With mild atrophic changes. Small sebaceous cyst classic in appearance middle right labia majora  Cervix normal  Uterus anteverted, normal size, shape and contour, midline and mobile nontender   Adnexa without masses or tenderness    Anus and perineum normal   Rectovaginal normal sphincter tone without palpated masses or tenderness.    Assessment/Plan:  55 y.o. 712P0002 female for annual exam.   1. Menopausal symptoms/atrophic genital changes/dyspareunia. Patient notes over the past year so worsening menopausal symptoms to include hot flushes, night sweats and foggy thinking. Also notes vaginal dryness with vaginal discomfort with intercourse. Reviewed options for management to include nonhormonal such as OTC soy based products for the global symptoms and vaginal moisturizers/lubricants for the vaginal symptoms. Full HRT to address both problems also  discussed. OTC soy based products for global symptoms and estrogen vaginal treatment for the vaginal symptoms lastly reviewed. I discussed the most recent 2017 [facility] HRT guidelines to include benefits such as symptom relief, possible cardiovascular and bone health benefits when started earlier as well as the risks to include increased risk of breast cancer and thrombosis such as stroke heart attack DVT. Various methods of replacement to include oral, transdermal and transvaginal applications were also reviewed. After a lengthy discussion she does want to try global HRT. Will start with Vivelle milligram patches and Prometrium 100 mg nightly. Refill 1 year for both. Need to call if any vaginal bleeding. Will call after 1-2 months if not satisfied with outcome. 2. Mid small classic sebaceous cyst mid right labia majora. Not bothersome to the patient. Patient will continue to follow report any issues.  3. Pap smear/HPV 01/2012.  No Pap smear done today. No history of significant abnormal Pap smears. Plan repeat Pap smear next year at 5 year interval per current screening guidelines.  4. Mammography 02/2016. Continue with annual mammography when due. SBE monthly reviewed.  5. Colonoscopy 10 years ago. Will call her gastroenterologist to see when they want to repeat this.    6. Health maintenance. Baseline CBC, CMP, lipid profile, urinalysis, TSH, vitamin D ordered. Follow up with HRT results otherwise annual exam in one year, sooner as needed.  Greater than 15 minutes of my time in excess of her routine GYN exam was spent in direct face to face counseling and coordination of care in regards to her problems of dyspareunia, menopausal symptoms and treatment options.    Dara LordsFONTAINE,Jessica Mullen P MD, 4:16 PM 04/16/2016

## 2016-04-17 LAB — URINALYSIS W MICROSCOPIC + REFLEX CULTURE
BILIRUBIN URINE: NEGATIVE
Bacteria, UA: NONE SEEN [HPF]
CASTS: NONE SEEN [LPF]
CRYSTALS: NONE SEEN [HPF]
Glucose, UA: NEGATIVE
HGB URINE DIPSTICK: NEGATIVE
KETONES UR: NEGATIVE
Nitrite: NEGATIVE
PH: 6.5 (ref 5.0–8.0)
Protein, ur: NEGATIVE
RBC / HPF: NONE SEEN RBC/HPF (ref <=2)
SPECIFIC GRAVITY, URINE: 1.009 (ref 1.001–1.035)
SQUAMOUS EPITHELIAL / LPF: NONE SEEN [HPF] (ref <=5)
WBC UA: NONE SEEN WBC/HPF (ref <=5)
Yeast: NONE SEEN [HPF]

## 2016-04-17 LAB — VITAMIN D 25 HYDROXY (VIT D DEFICIENCY, FRACTURES): VIT D 25 HYDROXY: 33 ng/mL (ref 30–100)

## 2016-04-19 LAB — URINE CULTURE

## 2016-04-22 ENCOUNTER — Other Ambulatory Visit: Payer: Self-pay | Admitting: Gynecology

## 2016-04-22 MED ORDER — CIPROFLOXACIN HCL 250 MG PO TABS: 250.0000 mg | ORAL_TABLET | Freq: Two times a day (BID) | ORAL | 0 refills | Status: DC

## 2016-04-25 ENCOUNTER — Telehealth: Payer: Self-pay

## 2016-04-25 ENCOUNTER — Other Ambulatory Visit: Payer: Self-pay | Admitting: Gynecology

## 2016-04-25 DIAGNOSIS — R399 Unspecified symptoms and signs involving the genitourinary system: Secondary | ICD-10-CM

## 2016-04-25 NOTE — Telephone Encounter (Signed)
I spoke with patient on 04/22/16 and sent Cipro x 3 days. She has finished it. I called her again today because I failed to put a note on her result note on 9/5.  She said that she is experiencing some urgency and some pressure.  She went on to say that she has an extensive history of kidney and bladder problems.  She said that she rode a ride at amusement park this past weekend with full bladder and bar pressed on her bladder and she thinks It might just be bladder bruised.  The urgency is no more than normal for her as she said she drinks a gallon of water every day.  I asked her if she wanted to return for culture?  Observe? Me to check with you?  I just wasn't sure what she wanted. She asked me to get your recommendation.

## 2016-04-25 NOTE — Telephone Encounter (Signed)
I would check a clean catch urine analysis and culture. If bladder symptoms continue I would refer her to urology.

## 2016-04-28 ENCOUNTER — Other Ambulatory Visit: Payer: Self-pay

## 2016-04-30 ENCOUNTER — Other Ambulatory Visit: Payer: Self-pay

## 2016-05-20 ENCOUNTER — Encounter: Payer: Self-pay | Admitting: Women's Health

## 2016-05-20 ENCOUNTER — Ambulatory Visit (INDEPENDENT_AMBULATORY_CARE_PROVIDER_SITE_OTHER): Payer: Managed Care, Other (non HMO) | Admitting: Women's Health

## 2016-05-20 VITALS — BP 124/80

## 2016-05-20 DIAGNOSIS — L298 Other pruritus: Secondary | ICD-10-CM | POA: Diagnosis not present

## 2016-05-20 DIAGNOSIS — B373 Candidiasis of vulva and vagina: Secondary | ICD-10-CM | POA: Diagnosis not present

## 2016-05-20 DIAGNOSIS — B3731 Acute candidiasis of vulva and vagina: Secondary | ICD-10-CM

## 2016-05-20 DIAGNOSIS — N898 Other specified noninflammatory disorders of vagina: Secondary | ICD-10-CM

## 2016-05-20 LAB — WET PREP FOR TRICH, YEAST, CLUE
CLUE CELLS WET PREP: NONE SEEN
Trich, Wet Prep: NONE SEEN

## 2016-05-20 MED ORDER — FLUCONAZOLE 150 MG PO TABS: 150.0000 mg | ORAL_TABLET | Freq: Once | ORAL | 1 refills | Status: AC

## 2016-05-20 NOTE — Patient Instructions (Signed)

## 2016-05-20 NOTE — Progress Notes (Signed)
Presents with complaint of vaginal itching and irritation for about 1 week. Has not used any over-the-counter products. Postmenopausal with no bleeding on Prometrium and Vivelle-Dot with good relief of menopausal symptoms. Denies urinary symptoms, abdominal pain or fever. History of recurrent UTIs.  Exam: Appears well. External genitalia erythematous at introitus, right labia 1 cm sebaceous cyst asymptomatic. Speculum exam moderate amount of a white curdy discharge vaginal walls erythematous, wet prep positive for moderate yeast. Bimanual no CMT or adnexal tenderness.  Yeast vaginitis  Plan: Diflucan 150 by mouth times one dose with refill. Instructed to call if no relief of symptoms. Yeast prevention discussed.

## 2016-06-05 ENCOUNTER — Telehealth: Payer: Self-pay

## 2016-06-05 NOTE — Telephone Encounter (Signed)
Voice mail box is full and I cannot leave a message. 

## 2016-06-05 NOTE — Telephone Encounter (Signed)
Patient informed. 

## 2016-06-05 NOTE — Telephone Encounter (Signed)
Patient said she has been using Vivelle Dot 0.075mg  patch and Prometrium 100 mg qhs for about 45 days. She said you told her to call immediately if she had any bleeding. She called Dr. On call last evening. She got home around 7pm from work and noticed light pink bleeding and it has continued. She also feels like menstrual cramps and "a heavy feeling".

## 2016-06-05 NOTE — Telephone Encounter (Signed)
Sometimes we can see a little bit of bleeding at the initiation of HRT.  I would continue on the HRT and watch for now. Hopefully it will all go away over the next week or 2. If she would continue to bleed call us and we may need to pursue looking at the endometrium with sonohysterogram.

## 2016-07-09 ENCOUNTER — Telehealth: Payer: Self-pay

## 2016-07-09 NOTE — Telephone Encounter (Signed)
Saw you 04/16/16 for yearly exam and began HRT patch and prometrium. She said she has had off and on spotting since starting it. Last night woke with menstrual like cramps and bleeding like a period.

## 2016-07-09 NOTE — Telephone Encounter (Signed)
Patient informed. She has just picked up the 100mg  Prometrium so has plenty to be able to take 2 hs.  She will call me when needs Rx for 200mg .  She knows we will check ins benefits in the next week or two and call her to schedule [facility].

## 2016-07-09 NOTE — Telephone Encounter (Signed)
Recommend: 1.  Staying on the HRT for now but increase her Prometrium to 200 mg nightly. 2. Schedule sonohysterogram to assess the endometrial cavity and to rule out abnormalities leading to this bleeding.

## 2016-07-21 ENCOUNTER — Telehealth: Payer: Self-pay | Admitting: *Deleted

## 2016-07-21 NOTE — Telephone Encounter (Signed)
Per Gershon Mussel at [facility] with bx covered 100% after $3500 deductible met. Pt has met $3477 of it.  Pt informed and will schedule. KW CMA

## 2016-07-24 LAB — HM COLONOSCOPY

## 2016-08-04 ENCOUNTER — Other Ambulatory Visit: Payer: Self-pay | Admitting: Gynecology

## 2016-08-04 DIAGNOSIS — N950 Postmenopausal bleeding: Secondary | ICD-10-CM

## 2016-08-05 ENCOUNTER — Telehealth: Payer: Self-pay | Admitting: *Deleted

## 2016-08-05 MED ORDER — PROGESTERONE MICRONIZED 200 MG PO CAPS: 200.0000 mg | ORAL_CAPSULE | Freq: Every day | ORAL | 7 refills | Status: DC

## 2016-08-05 MED FILL — PROGESTERONE 200 MG CAPSULE: 200 | 30 days supply | Qty: 30 | Fill #0

## 2016-08-05 NOTE — Telephone Encounter (Signed)
Pt called to follow up from telephone encounter 07/09/16 needs refill on progesterone 200 mg. Rx sent.

## 2016-08-15 ENCOUNTER — Ambulatory Visit (INDEPENDENT_AMBULATORY_CARE_PROVIDER_SITE_OTHER): Payer: Managed Care, Other (non HMO)

## 2016-08-15 ENCOUNTER — Encounter: Payer: Self-pay | Admitting: Gynecology

## 2016-08-15 ENCOUNTER — Ambulatory Visit (INDEPENDENT_AMBULATORY_CARE_PROVIDER_SITE_OTHER): Payer: Managed Care, Other (non HMO) | Admitting: Gynecology

## 2016-08-15 VITALS — Ht 62.0 in | Wt 169.0 lb

## 2016-08-15 DIAGNOSIS — N950 Postmenopausal bleeding: Secondary | ICD-10-CM

## 2016-08-15 NOTE — Progress Notes (Signed)
    Jessica SacramentoKathryn A Mullen 1961-06-03 [id number]        55 y.o.  G2P0002 presents for sonohysterogram. Was recently started on HRT to include Vivelle dot 0.075 mg patch and Prometrium 100 mg at bedtime. Began to have some spotting on and off which has persisted since starting in September. His increased her Prometrium to 200 mg nightly.  Past medical history,surgical history, problem list, medications, allergies, family history and social history were all reviewed and documented in the EPIC chart.  Directed ROS with pertinent positives and negatives documented in the history of present illness/assessment and plan.  Exam: Marlou StarksPam Summers assistant There were no vitals filed for this visit. General appearance:  Normal Abdomen soft nontender without masses guarding rebound Pelvic external BUS vagina normal. Cervix normal. Slight vaginal staining noted. Uterus normal size midline mobile nontender. Adnexa without masses or tenderness.  Ultrasound:  Transabdominal and transvaginal. Uterus normal size and echotexture. Endometrial echo 4.1 mm. Right ovary normal. Left adnexa negative consistent with history of LSO. Cul-de-sac negative.  Sonohysterogram performed, sterile technique, easy catheter introduction, good distention with no abnormalities. Endometrial sample taken. Patient tolerated well.  Assessment/Plan:  55 y.o. G2P0002 with postmenopausal spotting after initiation of HRT.  Initially started on Prometrium 100 mg. Subsequently was increased to 200 mg with resolution of her bleeding. Had a repeat episode of bleeding when she forgot to take her HRT 2 days. Sonohysterogram is negative. She'll follow up for biopsy results. She'll stay on the 200 mg of Prometrium for one month and then decrease to 100 mg and will see how she does with this. Continue on her Vivelle 0.075 mg patches. Relates doing great from a symptom relief standpoint.    Dara LordsFONTAINE,Keller Bounds P MD, 10:14 AM 08/15/2016

## 2016-08-15 NOTE — Patient Instructions (Signed)
Office will call you with biopsy results 

## 2016-08-15 NOTE — Addendum Note (Signed)
Addended by: Kem ParkinsonBARNES, Indianna Boran on: 08/15/2016 10:30 AM   Modules accepted: Orders

## 2016-09-03 ENCOUNTER — Encounter: Payer: Self-pay | Admitting: Family Medicine

## 2016-10-07 ENCOUNTER — Encounter: Payer: Self-pay | Admitting: Family Medicine

## 2016-10-07 ENCOUNTER — Ambulatory Visit (INDEPENDENT_AMBULATORY_CARE_PROVIDER_SITE_OTHER): Payer: 59 | Admitting: Family Medicine

## 2016-10-07 VITALS — BP 123/69 | HR 80 | Temp 99.0°F | Wt 157.0 lb

## 2016-10-07 DIAGNOSIS — J40 Bronchitis, not specified as acute or chronic: Secondary | ICD-10-CM

## 2016-10-07 MED ORDER — IPRATROPIUM BROMIDE 0.06 % NA SOLN: 2.0000 | NASAL | 6 refills | Status: DC | PRN

## 2016-10-07 MED ORDER — GUAIFENESIN-CODEINE 100-10 MG/5ML PO SOLN: 5.0000 mL | Freq: Every evening | ORAL | 0 refills | Status: DC | PRN

## 2016-10-07 MED ORDER — BENZONATATE 200 MG PO CAPS: 200.0000 mg | ORAL_CAPSULE | Freq: Three times a day (TID) | ORAL | 0 refills | Status: DC | PRN

## 2016-10-07 MED ORDER — AZITHROMYCIN 250 MG PO TABS: 250.0000 mg | ORAL_TABLET | Freq: Every day | ORAL | 0 refills | Status: DC

## 2016-10-07 NOTE — Progress Notes (Signed)
Jessica SacramentoKathryn A Mullen is a 56 y.o. female who [facility] today for headaches sore throat cough congestion wheezing body aches fever. Symptoms present for about 7 days worsening 2 days ago. She denies vomiting diarrhea chest pain or trouble breathing. She's tried NyQuil and DayQuil which have helped some. She notes multiple sick contacts at work.   Past Medical History:  Diagnosis Date  . Acute meniscal tear of left knee   . CMV (cytomegalovirus infection) (HCC)   . Endometriosis   . Environmental allergies   . IBS (irritable bowel syndrome)   . OA (osteoarthritis) of knee    BILATERAL KNEE  . TMJ (dislocation of temporomandibular joint)   . Wears glasses    Past Surgical History:  Procedure Laterality Date  . APPENDECTOMY    . CESAREAN SECTION  1984  &  1987  . COMBINED LAPAROSCOPY W/ HYSTEROSCOPY  10-31-2005   W/ BX AND ALBATION ENDOMETRIOSIS  . CYSTO/  LEFT URETERAL STENT PLACEMENT  AS CHILD  . EXPLORATORY LAPAROTOMY/  LEFT SALPINGOOPHORECTOMY/  ABLATION ENDOMETRIOSIS  04-20-2002  . KNEE ARTHROSCOPY Left 10/21/2013   Procedure: LEFT ARTHROSCOPY KNEE WITH DEBRIDEMENT;  Surgeon: Shelda PalMatthew D Olin, MD;  [facility] ;  Service: Orthopedics;  Laterality: Left;  . TRANSTHORACIC ECHOCARDIOGRAM  05-09-2012   NORMAL STUDY/  EF 60-65%/  NO EVIDENCE MVP  . URETERAL REIMPLANTION Left AGE 31   Social History  Substance Use Topics  . Smoking status: Never Smoker  . Smokeless tobacco: Never Used  . Alcohol use No   family history includes COPD in her father; Cancer in her father and maternal grandfather; Depression in her mother; Diabetes in her father; Heart disease in her father; Hypertension in her mother; Thyroid disease in her mother.  ROS as above:  Medications: Current Outpatient Prescriptions  Medication Sig Dispense Refill  .  estradiol (VIVELLE-DOT) 0.075 MG/24HR Place 1 patch onto the skin 2 (two) times a week. 8 patch 12  . omeprazole (PRILOSEC) 40 MG capsule Take 1 capsule (40 mg total) by mouth daily. 30 capsule 3  . progesterone (PROMETRIUM) 200 MG capsule Take 1 capsule (200 mg total) by mouth at bedtime. 30 capsule 7  . azithromycin (ZITHROMAX) 250 MG tablet Take 1 tablet (250 mg total) by mouth daily. Take first 2 tablets together, then 1 every day until finished. 6 tablet 0  . benzonatate (TESSALON) 200 MG capsule Take 1 capsule (200 mg total) by mouth 3 (three) times daily as needed for cough. 45 capsule 0  . guaiFENesin-codeine 100-10 MG/5ML syrup Take 5 mLs by mouth at bedtime as needed for cough. 120 mL 0  . ipratropium (ATROVENT) 0.06 % nasal spray Place 2 sprays into both nostrils every 4 (four) hours as needed for rhinitis. 10 mL 6   No current facility-administered medications for this visit.    Allergies  Allergen Reactions  . Propoxyphene N-Acetaminophen Shortness Of Breath    REACTION: Suppreses Respirations  . Adhesive [Tape] Other (See Comments)    "BLOOD BLISTERS"  . Bee Venom     "BEE STINGS"    Health Maintenance Health Maintenance  Topic Date Due  . PAP SMEAR  02/03/2015  . INFLUENZA VACCINE  03/18/2016  . TETANUS/TDAP  08/18/2016  . MAMMOGRAM  03/06/2017  . COLONOSCOPY  07/24/2026  . Hepatitis C Screening  Completed  . HIV Screening  Completed     Exam:  BP 123/69  Pulse 80   Temp 99 F (37.2 C) (Oral)   Wt 157 lb (71.2 kg)   SpO2 100%   BMI 28.72 kg/m  Gen: Well NAD HEENT: EOMI,  MMM Clear nasal discharge. Posterior pharynx cobblestoning. Mild cervical lymphadenopathy is present bilaterally. Lungs: Normal work of breathing. CTABL Heart: RRR no MRG Abd: NABS, Soft. Nondistended, Nontender Exts: Brisk capillary refill, warm and well perfused.    No results found for this or any previous visit (from the past 72 hour(s)). No results found.    Assessment and  Plan: 56 y.o. female with viral bronchitis with second sickening. Linda treat empirically with Atrovent nasal spray codeine cough syrup and Tessalon Perles for symptom control. Continue over-the-counter medications. Use azithromycin as backup if not improving.   No orders of the defined types were placed in this encounter.  Meds ordered this encounter  Medications  . azithromycin (ZITHROMAX) 250 MG tablet    Sig: Take 1 tablet (250 mg total) by mouth daily. Take first 2 tablets together, then 1 every day until finished.    Dispense:  6 tablet    Refill:  0  . ipratropium (ATROVENT) 0.06 % nasal spray    Sig: Place 2 sprays into both nostrils every 4 (four) hours as needed for rhinitis.    Dispense:  10 mL    Refill:  6  . guaiFENesin-codeine 100-10 MG/5ML syrup    Sig: Take 5 mLs by mouth at bedtime as needed for cough.    Dispense:  120 mL    Refill:  0  . benzonatate (TESSALON) 200 MG capsule    Sig: Take 1 capsule (200 mg total) by mouth 3 (three) times daily as needed for cough.    Dispense:  45 capsule    Refill:  0     Discussed warning signs or symptoms. Please see discharge instructions. Patient expresses understanding.

## 2016-10-07 NOTE — Patient Instructions (Signed)
Thank you for coming in today. Call or go to the emergency room if you get worse, have trouble breathing, have chest pains, or palpitations.  Take azithromycin if worsening.    Acute Bronchitis, Adult Acute bronchitis is when air tubes (bronchi) in the lungs suddenly get swollen. The condition can make it hard to breathe. It can also cause these symptoms:  A cough.  Coughing up clear, yellow, or green mucus.  Wheezing.  Chest congestion.  Shortness of breath.  A fever.  Body aches.  Chills.  A sore throat. Follow these instructions at home: Medicines  Take over-the-counter and prescription medicines only as told by your doctor.  If you were prescribed an antibiotic medicine, take it as told by your doctor. Do not stop taking the antibiotic even if you start to feel better. General instructions  Rest.  Drink enough fluids to keep your pee (urine) clear or pale yellow.  Avoid smoking and secondhand smoke. If you smoke and you need help quitting, ask your doctor. Quitting will help your lungs heal faster.  Use an inhaler, cool mist vaporizer, or humidifier as told by your doctor.  Keep all follow-up visits as told by your doctor. This is important. How is this prevented? To lower your risk of getting this condition again:  Wash your hands often with soap and water. If you cannot use soap and water, use hand sanitizer.  Avoid contact with people who have cold symptoms.  Try not to touch your hands to your mouth, nose, or eyes.  Make sure to get the flu shot every year. Contact a doctor if:  Your symptoms do not get better in 2 weeks. Get help right away if:  You cough up blood.  You have chest pain.  You have very bad shortness of breath.  You become dehydrated.  You faint (pass out) or keep feeling like you are going to pass out.  You keep throwing up (vomiting).  You have a very bad headache.  Your fever or chills gets worse. This information is  not intended to replace advice given to you by your health care provider. Make sure you discuss any questions you have with your health care provider. Document Released: 01/21/2008 Document Revised: 03/12/2016 Document Reviewed: 01/23/2016 Elsevier Interactive Patient Education  2017 [employer]

## 2016-12-30 ENCOUNTER — Telehealth: Payer: Self-pay | Admitting: *Deleted

## 2016-12-30 NOTE — Telephone Encounter (Signed)
Pt called states she had light cycle 3 weeks ago, which stopped, repeat episode of spotting last week for 3/4 days then stopped. Pt said yesterday spotting, pt did mention her patch fell of and she didn't notice this until it was time to put on a new one. Pt is not having any bleeding today, feels great, no symptoms. Pt will be leaving out of town x 1 week for work, will watch for now since no longer bleeding, follow up if bleed should occur again.

## 2017-04-28 ENCOUNTER — Other Ambulatory Visit: Payer: Self-pay | Admitting: Gynecology

## 2017-05-04 ENCOUNTER — Other Ambulatory Visit: Payer: Self-pay | Admitting: Gynecology

## 2017-05-11 ENCOUNTER — Ambulatory Visit (INDEPENDENT_AMBULATORY_CARE_PROVIDER_SITE_OTHER): Payer: 59 | Admitting: Gynecology

## 2017-05-11 ENCOUNTER — Encounter: Payer: Self-pay | Admitting: Gynecology

## 2017-05-11 VITALS — BP 118/76 | Ht 62.0 in | Wt 159.0 lb

## 2017-05-11 DIAGNOSIS — Z79890 Hormone replacement therapy (postmenopausal): Secondary | ICD-10-CM

## 2017-05-11 DIAGNOSIS — L723 Sebaceous cyst: Secondary | ICD-10-CM | POA: Diagnosis not present

## 2017-05-11 DIAGNOSIS — Z13220 Encounter for screening for lipoid disorders: Secondary | ICD-10-CM

## 2017-05-11 DIAGNOSIS — Z1151 Encounter for screening for human papillomavirus (HPV): Secondary | ICD-10-CM

## 2017-05-11 DIAGNOSIS — E559 Vitamin D deficiency, unspecified: Secondary | ICD-10-CM | POA: Diagnosis not present

## 2017-05-11 DIAGNOSIS — N952 Postmenopausal atrophic vaginitis: Secondary | ICD-10-CM

## 2017-05-11 DIAGNOSIS — Z01411 Encounter for gynecological examination (general) (routine) with abnormal findings: Secondary | ICD-10-CM

## 2017-05-11 DIAGNOSIS — N951 Menopausal and female climacteric states: Secondary | ICD-10-CM

## 2017-05-11 MED ORDER — ESTRADIOL 0.1 MG/24HR TD PTTW: 1.0000 | MEDICATED_PATCH | TRANSDERMAL | 12 refills | Status: DC

## 2017-05-11 MED ORDER — PROGESTERONE MICRONIZED 200 MG PO CAPS: 200.0000 mg | ORAL_CAPSULE | Freq: Every day | ORAL | 12 refills | Status: DC

## 2017-05-11 NOTE — Patient Instructions (Signed)
Follow up if any issues with your hormone replacement therapy to include continued symptoms or bleeding.

## 2017-05-11 NOTE — Addendum Note (Signed)
Addended by: Dayna Barker on: 05/11/2017 12:24 PM   Modules accepted: Orders

## 2017-05-11 NOTE — Progress Notes (Signed)
    Jessica Mullen May 31, 1961 [id number]        56 y.o.  G2P0002 for annual gynecologic exam.  Currently on Vivelle 0.075 and Prometrium 100 mg nightly. Notes some spotting mid cycle every 2 weeks or so per day over the last several months. Also notes worsening mood swings and vaginal dryness. Underwent sonohysterogram 07/2016 with negative findings to include negative endometrial biopsy.  Past medical history,surgical history, problem list, medications, allergies, family history and social history were all reviewed and documented as reviewed in the EPIC chart.  ROS:  Performed with pertinent positives and negatives included in the history, assessment and plan.   Additional significant findings :  None   Exam: Kennon Portela assistant Vitals:   05/11/17 1142  BP: 118/76  Weight: 159 lb (72.1 kg)  Height:  (1.575 m)   Body mass index is 29.08 kg/m.  General appearance:  Normal affect, orientation and appearance. Skin: Grossly normal HEENT: Without gross lesions.  No cervical or supraclavicular adenopathy. Thyroid normal.  Lungs:  Clear without wheezing, rales or rhonchi Cardiac: RR, without RMG Abdominal:  Soft, nontender, without masses, guarding, rebound, organomegaly or hernia Breasts:  Examined lying and sitting without masses, retractions, discharge or axillary adenopathy. Pelvic:  Ext, BUS, Vagina: With atrophic changes. Sebaceous cyst mid right labia majora  Cervix: With atrophic changes. Pap smear/HPV  Uterus: Anteverted, normal size, shape and contour, midline and mobile nontender   Adnexa: Without masses or tenderness    Anus and perineum: Normal   Rectovaginal: Normal sphincter tone without palpated masses or tenderness.    Assessment/Plan:  56 y.o. G43P0002 female for annual gynecologic exam.   1. Postmenopausal/atrophic genital changes/HRT. Patient on Vivelle 0.075 mg and Prometrium 100 mg nightly. Having worsening menopausal symptoms over the last several  months with some spotting. Also noting vaginal dryness. Had negative endometrial evaluation within the past year. Will increase her Vivelle to 0.1 mg in her Prometrium to 200 mg nightly. Discussed using Prometrium intermittently but this point we'll try daily to see if we can suppress any bleeding. Assuming patient does well when she starts this regimen then she'll stay on this for this coming year. We discussed the risks of HRT to include stroke heart attack DVT and breast cancer. We discussed benefits to include symptom relief as well as cardiovascular and bone health when started early. I did discuss that if she has relief of her global symptoms but vaginal dryness continues to be an issue that we may add vaginal estrogen on top of the Vivelle. She'll let me know if this continues to be an issue. 2. Sebaceous cyst mid right labia majora. Stable over serial exams. Not bothersome to the patient. 3. Mammography due now and patient knows to call and schedule and agrees to do so. Breast exam normal today. 4. Pap smear/HPV 2013. Pap smear/HPV today. No history of significant abnormal Pap smears. Plan five-year screening protocol per current screening guidelines. 5. Colonoscopy 2017. Repeat at their recommended interval. 6. DEXA never. Will plan further into the menopause. Has had low vitamin D is in the past. Check vitamin D level today. 7. Health maintenance. Requests baseline labs. CBC, CMP, lipid profile, vitamin D, TSH, urinalysis ordered. Follow up in one year, sooner if any issues with her HRT.   Dara Lords MD, 12:06 PM 05/11/2017

## 2017-05-12 LAB — LIPID PANEL
Cholesterol: 169 mg/dL (ref <200)
HDL: 61 mg/dL (ref >50)
LDL Cholesterol (Calc): 92 mg/dL (calc)
Non-HDL Cholesterol (Calc): 108 mg/dL (calc) (ref <130)
Total CHOL/HDL Ratio: 2.8 (calc) (ref <5.0)
Triglycerides: 75 mg/dL (ref <150)

## 2017-05-12 LAB — COMPREHENSIVE METABOLIC PANEL
AG RATIO: 1.6 (calc) (ref 1.0–2.5)
ALKALINE PHOSPHATASE (APISO): 54 U/L (ref 33–130)
ALT: 10 U/L (ref 6–29)
AST: 14 U/L (ref 10–35)
Albumin: 4.2 g/dL (ref 3.6–5.1)
BUN: 24 mg/dL (ref 7–25)
CHLORIDE: 103 mmol/L (ref 98–110)
CO2: 26 mmol/L (ref 20–32)
Calcium: 9.4 mg/dL (ref 8.6–10.4)
Creat: 0.69 mg/dL (ref 0.50–1.05)
GLOBULIN: 2.6 g/dL (ref 1.9–3.7)
GLUCOSE: 99 mg/dL (ref 65–99)
POTASSIUM: 4.7 mmol/L (ref 3.5–5.3)
Sodium: 139 mmol/L (ref 135–146)
Total Bilirubin: 0.5 mg/dL (ref 0.2–1.2)
Total Protein: 6.8 g/dL (ref 6.1–8.1)

## 2017-05-12 LAB — URINALYSIS W MICROSCOPIC + REFLEX CULTURE
BILIRUBIN URINE: NEGATIVE
Bacteria, UA: NONE SEEN /HPF
Glucose, UA: NEGATIVE
HGB URINE DIPSTICK: NEGATIVE
HYALINE CAST: NONE SEEN /LPF
KETONES UR: NEGATIVE
Leukocyte Esterase: NEGATIVE
Nitrites, Initial: NEGATIVE
PROTEIN: NEGATIVE
RBC / HPF: NONE SEEN /HPF (ref 0–2)
Specific Gravity, Urine: 1.012 (ref 1.001–1.03)
Squamous Epithelial / LPF: NONE SEEN /HPF (ref <=5)
WBC, UA: NONE SEEN /HPF (ref 0–5)
pH: 5.5 (ref 5.0–8.0)

## 2017-05-12 LAB — PAP IG AND HPV HIGH-RISK: HPV DNA High Risk: NOT DETECTED

## 2017-05-12 LAB — CBC WITH DIFFERENTIAL/PLATELET
BASOS PCT: 0.4 %
Basophils Absolute: 30 cells/uL (ref 0–200)
Eosinophils Absolute: 38 cells/uL (ref 15–500)
Eosinophils Relative: 0.5 %
HCT: 39.4 % (ref 35.0–45.0)
HEMOGLOBIN: 13.2 g/dL (ref 11.7–15.5)
Lymphs Abs: 2685 cells/uL (ref 850–3900)
MCH: 30.1 pg (ref 27.0–33.0)
MCHC: 33.5 g/dL (ref 32.0–36.0)
MCV: 90 fL (ref 80.0–100.0)
MPV: 9.8 fL (ref 7.5–12.5)
Monocytes Relative: 5.1 %
NEUTROS ABS: 4365 {cells}/uL (ref 1500–7800)
Neutrophils Relative %: 58.2 %
Platelets: 231 10*3/uL (ref 140–400)
RBC: 4.38 10*6/uL (ref 3.80–5.10)
RDW: 12.1 % (ref 11.0–15.0)
Total Lymphocyte: 35.8 %
WBC: 7.5 10*3/uL (ref 3.8–10.8)
WBCMIX: 383 {cells}/uL (ref 200–950)

## 2017-05-12 LAB — NO CULTURE INDICATED

## 2017-05-12 LAB — TSH: TSH: 4.11 mIU/L

## 2017-05-12 LAB — VITAMIN D 25 HYDROXY (VIT D DEFICIENCY, FRACTURES): VIT D 25 HYDROXY: 36 ng/mL (ref 30–100)

## 2017-05-15 ENCOUNTER — Other Ambulatory Visit: Payer: Self-pay | Admitting: Gynecology

## 2017-05-19 ENCOUNTER — Encounter: Payer: Managed Care, Other (non HMO) | Admitting: Gynecology

## 2017-05-29 ENCOUNTER — Other Ambulatory Visit: Payer: Self-pay | Admitting: Gynecology

## 2017-07-06 ENCOUNTER — Other Ambulatory Visit: Payer: Self-pay | Admitting: *Deleted

## 2017-07-06 MED ORDER — ESTRADIOL 0.1 MG/24HR TD PTTW: 1.0000 | MEDICATED_PATCH | TRANSDERMAL | 3 refills | Status: DC

## 2017-08-31 ENCOUNTER — Other Ambulatory Visit: Payer: Self-pay

## 2017-08-31 MED ORDER — ESTRADIOL 0.1 MG/24HR TD PTTW: 1.0000 | MEDICATED_PATCH | TRANSDERMAL | 3 refills | Status: DC

## 2017-09-28 ENCOUNTER — Encounter: Payer: Self-pay | Admitting: Family Medicine

## 2017-09-28 ENCOUNTER — Ambulatory Visit (INDEPENDENT_AMBULATORY_CARE_PROVIDER_SITE_OTHER): Payer: BLUE CROSS/BLUE SHIELD | Admitting: Family Medicine

## 2017-09-28 VITALS — BP 130/64 | HR 80 | Wt 166.0 lb

## 2017-09-28 DIAGNOSIS — J40 Bronchitis, not specified as acute or chronic: Secondary | ICD-10-CM | POA: Diagnosis not present

## 2017-09-28 DIAGNOSIS — Z1239 Encounter for other screening for malignant neoplasm of breast: Secondary | ICD-10-CM

## 2017-09-28 DIAGNOSIS — Z23 Encounter for immunization: Secondary | ICD-10-CM | POA: Diagnosis not present

## 2017-09-28 DIAGNOSIS — Z1231 Encounter for screening mammogram for malignant neoplasm of breast: Secondary | ICD-10-CM

## 2017-09-28 MED ORDER — AZITHROMYCIN 250 MG PO TABS: 250.0000 mg | ORAL_TABLET | Freq: Every day | ORAL | 0 refills | Status: DC

## 2017-09-28 NOTE — Patient Instructions (Signed)
Thank you for coming in today. Continue over the counter treatment . Use tessalon pearles and atrovent nasal spray as needed.  Use azithromycin as back if worse.  Get mammogram soon and arrange for Tdap in the near future.  Call or go to the emergency room if you get worse, have trouble breathing, have chest pains, or palpitations.    Acute Bronchitis, Adult Acute bronchitis is when air tubes (bronchi) in the lungs suddenly get swollen. The condition can make it hard to breathe. It can also cause these symptoms:  A cough.  Coughing up clear, yellow, or green mucus.  Wheezing.  Chest congestion.  Shortness of breath.  A fever.  Body aches.  Chills.  A sore throat.  Follow these instructions at home: Medicines  Take over-the-counter and prescription medicines only as told by your doctor.  If you were prescribed an antibiotic medicine, take it as told by your doctor. Do not stop taking the antibiotic even if you start to feel better. General instructions  Rest.  Drink enough fluids to keep your pee (urine) clear or pale yellow.  Avoid smoking and secondhand smoke. If you smoke and you need help quitting, ask your doctor. Quitting will help your lungs heal faster.  Use an inhaler, cool mist vaporizer, or humidifier as told by your doctor.  Keep all follow-up visits as told by your doctor. This is important. How is this prevented? To lower your risk of getting this condition again:  Wash your hands often with soap and water. If you cannot use soap and water, use hand sanitizer.  Avoid contact with people who have cold symptoms.  Try not to touch your hands to your mouth, nose, or eyes.  Make sure to get the flu shot every year.  Contact a doctor if:  Your symptoms do not get better in 2 weeks. Get help right away if:  You cough up blood.  You have chest pain.  You have very bad shortness of breath.  You become dehydrated.  You faint (pass out) or keep  feeling like you are going to pass out.  You keep throwing up (vomiting).  You have a very bad headache.  Your fever or chills gets worse. This information is not intended to replace advice given to you by your health care provider. Make sure you discuss any questions you have with your health care provider. Document Released: 01/21/2008 Document Revised: 03/12/2016 Document Reviewed: 01/23/2016 Elsevier Interactive Patient Education  [employer]2018 Elsevier Inc.

## 2017-09-28 NOTE — Progress Notes (Signed)
Jessica Mullen is a 57 y.o. female who presents to [facility]: Primary [facility] Medicine today with concern for bronchitis.  Patient has been ill for the past 4-5 days. Symptoms began with headache and bodyaches on Thursday. Headache lasted through weekend and gradually resolved. 2-3 days into illness, patient developed productive cough, post-nasal drip, and sore throat. She states that cough is productive of green-yellow mucous. No blood in mucous. Patient states that she has had subjective fevers, but has not measured temperature about 100. Denies shortness of breath, nausea/vomiting, or diarrhea.  Patient has been using Advil for headaches, and dayquil/nyquil as needed. She states that the headaches have resolved. Benzonatate has helped for cough in the past.   Patient does not have underlying lung disease. Patient had similar symptoms last year.   Past Medical History:  Diagnosis Date  . Acute meniscal tear of left knee   . CMV (cytomegalovirus infection) (HCC)   . Endometriosis   . Environmental allergies   . IBS (irritable bowel syndrome)   . OA (osteoarthritis) of knee    BILATERAL KNEE  . TMJ (dislocation of temporomandibular joint)   . Wears glasses    Past Surgical History:  Procedure Laterality Date  . APPENDECTOMY    . CESAREAN SECTION  1984  &  1987  . COMBINED LAPAROSCOPY W/ HYSTEROSCOPY  10-31-2005   W/ BX AND ALBATION ENDOMETRIOSIS  . CYSTO/  LEFT URETERAL STENT PLACEMENT  AS CHILD  . EXPLORATORY LAPAROTOMY/  LEFT SALPINGOOPHORECTOMY/  ABLATION ENDOMETRIOSIS  04-20-2002  . KNEE ARTHROSCOPY Left 10/21/2013   Procedure: LEFT ARTHROSCOPY KNEE WITH DEBRIDEMENT;  Surgeon: Shelda Pal, MD;  Location: [facility];  Service: Orthopedics;  Laterality: Left;  . TRANSTHORACIC ECHOCARDIOGRAM  05-09-2012   NORMAL STUDY/  EF 60-65%/  NO EVIDENCE MVP  . URETERAL  REIMPLANTION Left AGE 22   Social History   Tobacco Use  . Smoking status: Never Smoker  . Smokeless tobacco: Never Used  Substance Use Topics  . Alcohol use: No    Alcohol/week: 0.0 oz   family history includes COPD in her father; Cancer in her father and maternal grandfather; Depression in her mother; Diabetes in her father; Heart disease in her father; Hypertension in her mother; Thyroid disease in her mother.  ROS as above:  Medications: Current Outpatient Medications  Medication Sig Dispense Refill  . estradiol (VIVELLE-DOT) 0.1 MG/24HR patch Place 1 patch (0.1 mg total) onto the skin 2 (two) times a week. 24 patch 3  . progesterone (PROMETRIUM) 200 MG capsule Take 1 capsule (200 mg total) by mouth at bedtime. 30 capsule 12  . azithromycin (ZITHROMAX) 250 MG tablet Take 1 tablet (250 mg total) by mouth daily. Take first 2 tablets together, then 1 every day until finished. 6 tablet 0   No current facility-administered medications for this visit.    Allergies  Allergen Reactions  . Propoxyphene N-Acetaminophen Shortness Of Breath    REACTION: Suppreses Respirations  . Adhesive [Tape] Other (See Comments)    "BLOOD BLISTERS"  . Bee Venom     "BEE STINGS"    Health Maintenance Health Maintenance  Topic Date Due  . TETANUS/TDAP  08/18/2016  . MAMMOGRAM  03/06/2017  . PAP SMEAR  05/11/2020  . COLONOSCOPY  07/24/2026  . INFLUENZA VACCINE  Completed  . Hepatitis C Screening  Completed  . HIV Screening  Completed     Exam:  BP 130/64  Pulse 80   Wt 166 lb (75.3 kg)   SpO2 98%   BMI 30.36 kg/m  Gen: Well NAD HEENT: EOMI,  MMM. No cervical lymphadenopathy. Oropharynx clear without erythema or exudate.  Lungs: Normal work of breathing. CTABL. No wheezes or crackles. Heart: RRR no MRG Abd: NABS, Soft. Nondistended, Nontender Exts: Brisk capillary refill, warm and well perfused.   No results found for this or any previous visit (from the past 72 hour(s)). No  results found.  Assessment and Plan: 57 y.o. female with 4-5 day history of productive cough and URI symptoms, most consistent with bronchitis. Other consideration is flu with post-flu pneumonia. However patient is not short of breath and lungs are clear on exam.   Patient counseled that bronchitis is generally viral in nature and best managed symptomatically. She should continue advil as needed, benzonatate as needed, and atrovent nasal spray as needed. Patient also supplied with back up prescription for azithromycin if she is worsening over the next several days.   Additionally we discussed breast cancer screening.  3D mammogram ordered.  Additionally she is due for Tdap vaccine.  She is sick today so I think she should return in the near future for Tdap vaccine.  Orders Placed This Encounter  Procedures  . MM SCREENING BREAST TOMO BILATERAL    Standing Status:   Future    Standing Expiration Date:   11/27/2018    Order Specific Question:   Reason for Exam (SYMPTOM  OR DIAGNOSIS REQUIRED)    Answer:   screen breast cancer    Order Specific Question:   Is the patient pregnant?    Answer:   No    Order Specific Question:   Preferred imaging location?    [facility]   Meds ordered this encounter  Medications  . azithromycin (ZITHROMAX) 250 MG tablet    Sig: Take 1 tablet (250 mg total) by mouth daily. Take first 2 tablets together, then 1 every day until finished.    Dispense:  6 tablet    Refill:  0    Discussed warning signs or symptoms. Please see discharge instructions. Patient expresses understanding.

## 2017-10-08 ENCOUNTER — Ambulatory Visit (INDEPENDENT_AMBULATORY_CARE_PROVIDER_SITE_OTHER): Payer: BLUE CROSS/BLUE SHIELD

## 2017-10-08 DIAGNOSIS — Z1231 Encounter for screening mammogram for malignant neoplasm of breast: Secondary | ICD-10-CM | POA: Diagnosis not present

## 2017-10-08 DIAGNOSIS — Z1239 Encounter for other screening for malignant neoplasm of breast: Secondary | ICD-10-CM

## 2018-01-26 ENCOUNTER — Encounter: Payer: Self-pay | Admitting: Gynecology

## 2018-01-28 ENCOUNTER — Encounter: Payer: Self-pay | Admitting: Gynecology

## 2018-01-28 ENCOUNTER — Ambulatory Visit (INDEPENDENT_AMBULATORY_CARE_PROVIDER_SITE_OTHER): Payer: BLUE CROSS/BLUE SHIELD | Admitting: Gynecology

## 2018-01-28 VITALS — BP 118/74

## 2018-01-28 DIAGNOSIS — N950 Postmenopausal bleeding: Secondary | ICD-10-CM | POA: Diagnosis not present

## 2018-01-28 DIAGNOSIS — R5383 Other fatigue: Secondary | ICD-10-CM

## 2018-01-28 NOTE — Progress Notes (Signed)
    Kathlyn SacramentoKathryn A Osias 1961-02-10 [id number]        57 y.o.  G2P0002 presents with 2 issues:  1. Postmenopausal bleeding.  Patient notes over the last several months bleeding on and off.  Light staining every other week or so.  Currently on Vivelle 0.1 mg patch and Prometrium 200 mg nightly.  Had sonohysterogram 07/2016 which was negative.  No cramping or pain associated with the bleeding. 2. Fatigue.  Over the last 6 months or so patient notes increasing fatigue where she feels like she wants to sleep most of the day.  No other symptoms such as hair loss weight changes skin nausea vomiting diarrhea constipation.  Past medical history,surgical history, problem list, medications, allergies, family history and social history were all reviewed and documented in the EPIC chart.  Directed ROS with pertinent positives and negatives documented in the history of present illness/assessment and plan.  Exam: Kennon PortelaKim Gardner assistant Vitals:   01/28/18 0930  BP: 118/74   General appearance:  Normal Abdomen soft nontender without masses guarding rebound Pelvic external BUS vagina with classic appearing sebaceous cyst right labia majora.  General atrophic changes.  Cervix atrophic changes.  Uterus normal size midline mobile nontender.  Adnexa without masses or tenderness.  Assessment/Plan:  57 y.o. G2P0002 with:  1. Postmenopausal bleeding persisting.  Was worked up year and a half ago.  Recommend repeating sonohysterogram to clear the endometrium.  Differential again reviewed to include atrophic changes, hormonal dysfunctional changes, hyperplasia, polyps.  Various scenarios reviewed depending on ultrasound and biopsy results. 2. Fatigue.  Whether related to hormonal issues as above versus other etiologies such as thyroid dysfunction or other possibilities.  Will initiate with CBC, comprehensive metabolic panel, thyroid panel and ANA.  Patient will follow-up for results and then will go from  there.  Greater than 50% of my time was spent in direct face to face counseling and coordination of care with the patient.    Dara Lordsimothy P Fontaine MD, 9:41 AM 01/28/2018

## 2018-01-28 NOTE — Patient Instructions (Signed)
Follow-up for the ultrasound as scheduled. 

## 2018-01-31 LAB — CBC WITH DIFFERENTIAL/PLATELET
BASOS ABS: 41 {cells}/uL (ref 0–200)
Basophils Relative: 0.5 %
Eosinophils Absolute: 90 cells/uL (ref 15–500)
Eosinophils Relative: 1.1 %
HCT: 42.6 % (ref 35.0–45.0)
Hemoglobin: 14.5 g/dL (ref 11.7–15.5)
Lymphs Abs: 2058 cells/uL (ref 850–3900)
MCH: 30.3 pg (ref 27.0–33.0)
MCHC: 34 g/dL (ref 32.0–36.0)
MCV: 89.1 fL (ref 80.0–100.0)
MONOS PCT: 4.5 %
MPV: 9.6 fL (ref 7.5–12.5)
NEUTROS PCT: 68.8 %
Neutro Abs: 5642 cells/uL (ref 1500–7800)
PLATELETS: 270 10*3/uL (ref 140–400)
RBC: 4.78 10*6/uL (ref 3.80–5.10)
RDW: 12 % (ref 11.0–15.0)
TOTAL LYMPHOCYTE: 25.1 %
WBC mixed population: 369 cells/uL (ref 200–950)
WBC: 8.2 10*3/uL (ref 3.8–10.8)

## 2018-01-31 LAB — COMPREHENSIVE METABOLIC PANEL
AG Ratio: 1.6 (calc) (ref 1.0–2.5)
ALT: 14 U/L (ref 6–29)
AST: 14 U/L (ref 10–35)
Albumin: 4.2 g/dL (ref 3.6–5.1)
Alkaline phosphatase (APISO): 67 U/L (ref 33–130)
BUN: 23 mg/dL (ref 7–25)
CO2: 26 mmol/L (ref 20–32)
Calcium: 9.4 mg/dL (ref 8.6–10.4)
Chloride: 104 mmol/L (ref 98–110)
Creat: 0.83 mg/dL (ref 0.50–1.05)
GLUCOSE: 110 mg/dL — AB (ref 65–99)
Globulin: 2.7 g/dL (calc) (ref 1.9–3.7)
Potassium: 4.4 mmol/L (ref 3.5–5.3)
Sodium: 140 mmol/L (ref 135–146)
Total Bilirubin: 0.4 mg/dL (ref 0.2–1.2)
Total Protein: 6.9 g/dL (ref 6.1–8.1)

## 2018-01-31 LAB — ANA: ANA: NEGATIVE

## 2018-01-31 LAB — THYROID PANEL WITH TSH
FREE THYROXINE INDEX: 2.6 (ref 1.4–3.8)
T3 Uptake: 27 % (ref 22–35)
T4 TOTAL: 9.5 ug/dL (ref 5.1–11.9)
TSH: 3.98 mIU/L

## 2018-02-17 ENCOUNTER — Other Ambulatory Visit: Payer: Self-pay | Admitting: Gynecology

## 2018-02-17 DIAGNOSIS — N950 Postmenopausal bleeding: Secondary | ICD-10-CM

## 2018-03-03 ENCOUNTER — Ambulatory Visit (INDEPENDENT_AMBULATORY_CARE_PROVIDER_SITE_OTHER): Payer: BLUE CROSS/BLUE SHIELD | Admitting: Gynecology

## 2018-03-03 ENCOUNTER — Encounter: Payer: Self-pay | Admitting: Gynecology

## 2018-03-03 ENCOUNTER — Ambulatory Visit (INDEPENDENT_AMBULATORY_CARE_PROVIDER_SITE_OTHER): Payer: BLUE CROSS/BLUE SHIELD

## 2018-03-03 VITALS — BP 124/78

## 2018-03-03 DIAGNOSIS — N950 Postmenopausal bleeding: Secondary | ICD-10-CM

## 2018-03-03 MED ORDER — PROGESTERONE MICRONIZED 100 MG PO CAPS: 100.0000 mg | ORAL_CAPSULE | Freq: Every day | ORAL | 11 refills | Status: DC

## 2018-03-03 NOTE — Patient Instructions (Signed)
Start on the new Prometrium 100 mg daily dose.  Call if bleeding continues despite this.  The office will call you in several days with the endometrial biopsy results from the ultrasound..Marland Kitchen

## 2018-03-03 NOTE — Progress Notes (Signed)
    Jessica SacramentoKathryn A Santaella 06/15/1961 [id number]        57 y.o.  G2P0002 presents for sonohysterogram.  Patient has been having prolonged postmenopausal spotting currently on estradiol 1 mg and Prometrium 200 mg nightly.  Had previously been on 100 mg nightly but increase to 200 to see if we could not alleviate her spotting.  Unfortunately her bleeding has continued.  She had a sonohysterogram 07/2016 which was negative for the same complaint.  Past medical history,surgical history, problem list, medications, allergies, family history and social history were all reviewed and documented in the EPIC chart.  Directed ROS with pertinent positives and negatives documented in the history of present illness/assessment and plan.  Exam: Pam Falls assistant Vitals:   03/03/18 1544  BP: 124/78   General appearance:  Normal Abdomen soft nontender without masses guarding rebound Pelvic external BUS vagina with atrophic changes.  Small sebaceous cyst right mid labia majora.  Cervix atrophic.  Uterus grossly normal size midline mobile nontender.  Adnexa without masses or tenderness.  Ultrasound transvaginal and transabdominal shows uterus normal size and echotexture.  Endometrial echo 2.9 mm.  Right ovary is normal.  Left adnexa negative noting history of LSO.  Cul-de-sac negative  Sonohysterogram performed, sterile technique, disposable dilator for external os dilatation, easy catheter introduction afterwards with good distention and no abnormalities.  Assessment/Plan:  57 y.o. G2P0002 persistent postmenopausal bleeding on HRT.  Cavity is empty and atrophic in appearance.  Patient will follow-up for biopsy results.  Recommended decreasing her Prometrium 200 mg to increase the estrogen to progesterone ratio to see if this does not alleviate her bleeding.  If persists then discussed possibly switching to a synthetic progesterone such as medroxyprogesterone to see if this does not stop the bleeding.  Patient will  call if bleeding continues after adjusting her Prometrium.   Dara Lordsimothy P Shernell Saldierna MD, 3:57 PM 03/03/2018

## 2018-03-08 NOTE — Telephone Encounter (Signed)
As long as things be getting better than I would monitor for now will use ibuprofen as needed.  If she would have worsening pain, fevers or other symptoms then office visit.

## 2018-07-01 ENCOUNTER — Encounter: Payer: Self-pay | Admitting: Gynecology

## 2018-07-01 MED ORDER — VALACYCLOVIR HCL 500 MG PO TABS: 500.0000 mg | ORAL_TABLET | Freq: Two times a day (BID) | ORAL | 2 refills | Status: DC

## 2018-07-01 NOTE — Telephone Encounter (Signed)
Okay for Valtrex 500 mg twice daily x7 days. Number 14.  2 refills

## 2018-08-25 DIAGNOSIS — M25562 Pain in left knee: Secondary | ICD-10-CM | POA: Diagnosis not present

## 2018-08-26 ENCOUNTER — Other Ambulatory Visit: Payer: Self-pay | Admitting: Gynecology

## 2018-08-26 ENCOUNTER — Ambulatory Visit (INDEPENDENT_AMBULATORY_CARE_PROVIDER_SITE_OTHER): Payer: BLUE CROSS/BLUE SHIELD | Admitting: Family Medicine

## 2018-08-26 ENCOUNTER — Encounter: Payer: Self-pay | Admitting: Family Medicine

## 2018-08-26 VITALS — BP 120/88 | HR 90 | Temp 98.3°F | Ht 63.0 in | Wt 186.0 lb

## 2018-08-26 DIAGNOSIS — R51 Headache: Secondary | ICD-10-CM | POA: Diagnosis not present

## 2018-08-26 DIAGNOSIS — R519 Headache, unspecified: Secondary | ICD-10-CM

## 2018-08-26 DIAGNOSIS — R05 Cough: Secondary | ICD-10-CM | POA: Diagnosis not present

## 2018-08-26 DIAGNOSIS — R059 Cough, unspecified: Secondary | ICD-10-CM

## 2018-08-26 LAB — POCT INFLUENZA A/B
INFLUENZA B, POC: NEGATIVE
Influenza A, POC: NEGATIVE

## 2018-08-26 MED ORDER — PREDNISONE 10 MG PO TABS: 30.0000 mg | ORAL_TABLET | Freq: Every day | ORAL | 0 refills | Status: DC

## 2018-08-26 NOTE — Patient Instructions (Signed)
Thank you for coming in today. I think the symptoms are likely due to the beginning of a virus.  Take the tylenol.  Max dose is 1000mg  every 6 hours as needed.  If not improving fill and take prednsione.  Call or go to the emergency room if you get worse, have trouble breathing, have chest pains, or palpitations.    Sinusitis, Adult Sinusitis is inflammation of your sinuses. Sinuses are hollow spaces in the bones around your face. Your sinuses are located:  Around your eyes.  In the middle of your forehead.  Behind your nose.  In your cheekbones. Mucus normally drains out of your sinuses. When your nasal tissues become inflamed or swollen, mucus can become trapped or blocked. This allows bacteria, viruses, and fungi to grow, which leads to infection. Most infections of the sinuses are caused by a virus. Sinusitis can develop quickly. It can last for up to 4 weeks (acute) or for more than 12 weeks (chronic). Sinusitis often develops after a cold. What are the causes? This condition is caused by anything that creates swelling in the sinuses or stops mucus from draining. This includes:  Allergies.  Asthma.  Infection from bacteria or viruses.  Deformities or blockages in your nose or sinuses.  Abnormal growths in the nose (nasal polyps).  Pollutants, such as chemicals or irritants in the air.  Infection from fungi (rare). What increases the risk? You are more likely to develop this condition if you:  Have a weak body defense system (immune system).  Do a lot of swimming or diving.  Overuse nasal sprays.  Smoke. What are the signs or symptoms? The main symptoms of this condition are pain and a feeling of pressure around the affected sinuses. Other symptoms include:  Stuffy nose or congestion.  Thick drainage from your nose.  Swelling and warmth over the affected sinuses.  Headache.  Upper toothache.  A cough that may get worse at night.  Extra mucus that  collects in the throat or the back of the nose (postnasal drip).  Decreased sense of smell and taste.  Fatigue.  A fever.  Sore throat.  Bad breath. How is this diagnosed? This condition is diagnosed based on:  Your symptoms.  Your medical history.  A physical exam.  Tests to find out if your condition is acute or chronic. This may include: ? Checking your nose for nasal polyps. ? Viewing your sinuses using a device that has a light (endoscope). ? Testing for allergies or bacteria. ? Imaging tests, such as an MRI or CT scan. In rare cases, a bone biopsy may be done to rule out more serious types of fungal sinus disease. How is this treated? Treatment for sinusitis depends on the cause and whether your condition is chronic or acute.  If caused by a virus, your symptoms should go away on their own within 10 days. You may be given medicines to relieve symptoms. They include: ? Medicines that shrink swollen nasal passages (topical intranasal decongestants). ? Medicines that treat allergies (antihistamines). ? A spray that eases inflammation of the nostrils (topical intranasal corticosteroids). ? Rinses that help get rid of thick mucus in your nose (nasal saline washes).  If caused by bacteria, your health care provider may recommend waiting to see if your symptoms improve. Most bacterial infections will get better without antibiotic medicine. You may be given antibiotics if you have: ? A severe infection. ? A weak immune system.  If caused by narrow nasal passages  or nasal polyps, you may need to have surgery. Follow these instructions at home: Medicines  Take, use, or apply over-the-counter and prescription medicines only as told by your health care provider. These may include nasal sprays.  If you were prescribed an antibiotic medicine, take it as told by your health care provider. Do not stop taking the antibiotic even if you start to feel better. Hydrate and  humidify   Drink enough fluid to keep your urine pale yellow. Staying hydrated will help to thin your mucus.  Use a cool mist humidifier to keep the humidity level in your home above 50%.  Inhale steam for 10-15 minutes, 3-4 times a day, or as told by your health care provider. You can do this in the bathroom while a hot shower is running.  Limit your exposure to cool or dry air. Rest  Rest as much as possible.  Sleep with your head raised (elevated).  Make sure you get enough sleep each night. General instructions   Apply a warm, moist washcloth to your face 3-4 times a day or as told by your health care provider. This will help with discomfort.  Wash your hands often with soap and water to reduce your exposure to germs. If soap and water are not available, use hand sanitizer.  Do not smoke. Avoid being around people who are smoking (secondhand smoke).  Keep all follow-up visits as told by your health care provider. This is important. Contact a health care provider if:  You have a fever.  Your symptoms get worse.  Your symptoms do not improve within 10 days. Get help right away if:  You have a severe headache.  You have persistent vomiting.  You have severe pain or swelling around your face or eyes.  You have vision problems.  You develop confusion.  Your neck is stiff.  You have trouble breathing. Summary  Sinusitis is soreness and inflammation of your sinuses. Sinuses are hollow spaces in the bones around your face.  This condition is caused by nasal tissues that become inflamed or swollen. The swelling traps or blocks the flow of mucus. This allows bacteria, viruses, and fungi to grow, which leads to infection.  If you were prescribed an antibiotic medicine, take it as told by your health care provider. Do not stop taking the antibiotic even if you start to feel better.  Keep all follow-up visits as told by your health care provider. This is  important. This information is not intended to replace advice given to you by your health care provider. Make sure you discuss any questions you have with your health care provider. Document Released: 08/04/2005 Document Revised: 01/04/2018 Document Reviewed: 01/04/2018 Elsevier Interactive Patient Education  2019 [employer].

## 2018-08-26 NOTE — Progress Notes (Signed)
Jessica Mullen is a 58 y.o. female who presents to [facility] today for headache.  Symptoms starting this morning.  Headache is moderate to severe.  She denies any vision change weakness or numbness or loss of function.  She does however note subjective fevers and chills and mild body aches.  She is tried some over-the-counter medications which help a bit.  She did receive the flu vaccine this year but notes positive flu exposure potentially at work.  She denies significant cough or congestion or shortness of breath.  She notes her symptoms are not like her typical bronchitis that she gets this time of year.   ROS as above:  Exam:  BP 120/88   Pulse 90   Temp 98.3 F (36.8 C) (Oral)   Ht 5\' 3"  (1.6 m)   Wt 186 lb (84.4 kg)   BMI 32.95 kg/m  Wt Readings from Last 5 Encounters:  08/26/18 186 lb (84.4 kg)  09/28/17 166 lb (75.3 kg)  05/11/17 159 lb (72.1 kg)  10/07/16 157 lb (71.2 kg)  08/15/16 169 lb (76.7 kg)    Gen: Well NAD HEENT: EOMI,  MMM tympanic membranes partially occluded by cerumen bilaterally.  Posterior pharynx relatively normal-appearing mild cervical lymphadenopathy.  Maxillary and frontal sinuses not particularly tender. Lungs: Normal work of breathing. CTABL Heart: RRR no MRG Abd: NABS, Soft. Nondistended, Nontender Exts: Brisk capillary refill, warm and well perfused.   Lab and Radiology Results Results for orders placed or performed in visit on 08/26/18 (from the past 72 hour(s))  POCT Influenza A/B     Status: None   Collection Time: 08/26/18  3:10 PM  Result Value Ref Range   Influenza A, POC Negative Negative   Influenza B, POC Negative Negative   No results found.    Assessment and Plan: 58 y.o. female with headache mild subjective fevers and chills and mild body aches.  This is likely the very beginnings of a viral illness.   Given the lack of fever and negative rapid flu test I do think that influenza is unlikely.  Especially as she has been vaccinated previously this year.  Plan for symptomatic management with Tylenol.  Additionally continue over-the-counter medications which may be helpful.  If symptoms are not improving prednisone may be a reasonable good neck step.  Recheck as needed.  PDMP not reviewed this encounter. Orders Placed This Encounter  Procedures  . POCT Influenza A/B   Meds ordered this encounter  Medications  . predniSONE (DELTASONE) 10 MG tablet    Sig: Take 3 tablets (30 mg total) by mouth daily with breakfast.    Dispense:  15 tablet    Refill:  0     Historical information moved to improve visibility of documentation.  Past Medical History:  Diagnosis Date  . Acute meniscal tear of left knee   . CMV (cytomegalovirus infection) (HCC)   . Endometriosis   . Environmental allergies   . IBS (irritable bowel syndrome)   . OA (osteoarthritis) of knee    BILATERAL KNEE  . TMJ (dislocation of temporomandibular joint)   . Wears glasses    Past Surgical History:  Procedure Laterality Date  . APPENDECTOMY    . CESAREAN SECTION  1984  &  1987  . COMBINED LAPAROSCOPY W/ HYSTEROSCOPY  10-31-2005   W/ BX AND ALBATION ENDOMETRIOSIS  . CYSTO/  LEFT URETERAL STENT PLACEMENT  AS CHILD  . EXPLORATORY  LAPAROTOMY/  LEFT SALPINGOOPHORECTOMY/  ABLATION ENDOMETRIOSIS  04-20-2002  . KNEE ARTHROSCOPY Left 10/21/2013   Procedure: LEFT ARTHROSCOPY KNEE WITH DEBRIDEMENT;  Surgeon: Shelda PalMatthew D Olin, MD;  [facility];  Service: Orthopedics;  Laterality: Left;  . TRANSTHORACIC ECHOCARDIOGRAM  05-09-2012   NORMAL STUDY/  EF 60-65%/  NO EVIDENCE MVP  . URETERAL REIMPLANTION Left AGE 32   Social History   Tobacco Use  . Smoking status: Never Smoker  . Smokeless tobacco: Never Used  Substance Use Topics  . Alcohol use: No    Alcohol/week: 0.0 standard drinks   family history  includes COPD in her father; Cancer in her father and maternal grandfather; Depression in her mother; Diabetes in her father; Heart disease in her father; Hypertension in her mother; Thyroid disease in her mother.  Medications: Current Outpatient Medications  Medication Sig Dispense Refill  . estradiol (VIVELLE-DOT) 0.1 MG/24HR patch Place 1 patch (0.1 mg total) onto the skin 2 (two) times a week. 24 patch 3  . progesterone (PROMETRIUM) 100 MG capsule Take 1 capsule (100 mg total) by mouth at bedtime. 30 capsule 11  . valACYclovir (VALTREX) 500 MG tablet Take 1 tablet (500 mg total) by mouth 2 (two) times daily. 14 tablet 2  . predniSONE (DELTASONE) 10 MG tablet Take 3 tablets (30 mg total) by mouth daily with breakfast. 15 tablet 0   No current facility-administered medications for this visit.    Allergies  Allergen Reactions  . Propoxyphene N-Acetaminophen Shortness Of Breath    REACTION: Suppreses Respirations  . Adhesive [Tape] Other (See Comments)    "BLOOD BLISTERS"  . Bee Venom     "BEE STINGS"     Discussed warning signs or symptoms. Please see discharge instructions. Patient expresses understanding.

## 2018-08-27 ENCOUNTER — Encounter: Payer: Self-pay | Admitting: Gynecology

## 2018-08-30 ENCOUNTER — Telehealth: Payer: Self-pay | Admitting: *Deleted

## 2018-08-30 MED ORDER — ESTRADIOL 0.1 MG/24HR TD PTTW: 1.0000 | MEDICATED_PATCH | TRANSDERMAL | 0 refills | Status: DC

## 2018-08-30 NOTE — Telephone Encounter (Signed)
Patient annual exam scheduled on 10/07/18, needs refill on estradiol patch 0.1 mg

## 2018-08-30 NOTE — Telephone Encounter (Signed)
Jessica Mullen can you help patient schedule annual exam

## 2018-08-30 NOTE — Telephone Encounter (Signed)
Appointment schedule for February 20 with Dr Audie Box.

## 2018-09-22 ENCOUNTER — Encounter: Payer: Self-pay | Admitting: Gynecology

## 2018-09-23 NOTE — Telephone Encounter (Signed)
They do make generic patches but they tend to be large.  I would recommend starting on estradiol 1 mg daily tablet.  Slight increased risk of thrombosis with pills over patch but in her clinical situation I do not think that is significant given her low risk status.  Estradiol pills are cheap.  I would continue on the Prometrium also nightly.  Alternative is if she would like the generic patch then 0.1 mg generic patch

## 2018-10-07 ENCOUNTER — Ambulatory Visit (INDEPENDENT_AMBULATORY_CARE_PROVIDER_SITE_OTHER): Payer: BLUE CROSS/BLUE SHIELD | Admitting: Gynecology

## 2018-10-07 ENCOUNTER — Encounter: Payer: Self-pay | Admitting: Gynecology

## 2018-10-07 VITALS — BP 118/78 | Ht 62.0 in | Wt 189.0 lb

## 2018-10-07 DIAGNOSIS — N952 Postmenopausal atrophic vaginitis: Secondary | ICD-10-CM | POA: Diagnosis not present

## 2018-10-07 DIAGNOSIS — Z79890 Hormone replacement therapy (postmenopausal): Secondary | ICD-10-CM | POA: Diagnosis not present

## 2018-10-07 DIAGNOSIS — Z01419 Encounter for gynecological examination (general) (routine) without abnormal findings: Secondary | ICD-10-CM | POA: Diagnosis not present

## 2018-10-07 DIAGNOSIS — N926 Irregular menstruation, unspecified: Secondary | ICD-10-CM

## 2018-10-07 NOTE — Progress Notes (Signed)
    Jessica Mullen 08/06/1961 [id number]        58 y.o.  G2P0002 for annual gynecologic exam.  Continues to have bleeding on and off.  Was evaluated in July with a negative sonohysterogram and negative endometrial biopsy.  Continues on estradiol patch with Prometrium at night.  Also struggling with her weight noting a 30 pound weight gain over the past year or so.  Past medical history,surgical history, problem list, medications, allergies, family history and social history were all reviewed and documented as reviewed in the EPIC chart.  ROS:  Performed with pertinent positives and negatives included in the history, assessment and plan.   Additional significant findings : None   Exam: Jessica Mullen assistant Vitals:   10/07/18 0849  BP: 118/78  Weight: 189 lb (85.7 kg)  Height: 5\' 2"  (1.575 m)   Body mass index is 34.57 kg/m.  General appearance:  Normal affect, orientation and appearance. Skin: Grossly normal HEENT: Without gross lesions.  No cervical or supraclavicular adenopathy. Thyroid normal.  Lungs:  Clear without wheezing, rales or rhonchi Cardiac: RR, without RMG Abdominal:  Soft, nontender, without masses, guarding, rebound, organomegaly or hernia Breasts:  Examined lying and sitting without masses, retractions, discharge or axillary adenopathy. Pelvic:  Ext, BUS, Vagina: With atrophic changes.  Sebaceous cyst mid right labia majora classic in appearance.  Cervix: With atrophic changes  Uterus: Anteverted, normal size, shape and contour, midline and mobile nontender   Adnexa: Without masses or tenderness    Anus and perineum: Normal   Rectovaginal: Normal sphincter tone without palpated masses or tenderness.    Assessment/Plan:  59 y.o. G65P0002 female for annual gynecologic exam.  1. Postmenopausal/irregular bleeding/HRT.  Options for the irregular bleeding discussed to include changing her HRT regiment versus trying to stop and see how she does off of HRT.  After  discussing the pros and cons of each choice the patient wants to go ahead and wean off of HRT.  Assuming she does well with this then will follow.  She will call if she continues to have irregular bleeding.  If she does develop significant menopausal symptoms and wants to restart HRT then we will switch her regiment and try Activella equivalent.  We have discussed the risks to include thrombosis in the breast cancer issue. 2. Sebaceous cyst right mid labia majora present for years, stable on exam, not bothersome to the patient 3. Mammography coming due and I reminded patient to schedule.  Breast exam normal today. 4. Pap smear/HPV 2018.  No Pap smear done today.  No history of significant abnormal Pap smears. 5. Colonoscopy 2017.  Repeat at their recommended interval. 6. Health maintenance.  We discussed her weight gain.  She is not able to exercise as she has in the past.  Also eating a lot of fast foods.  We discussed strategies to include weight watchers.  Thyroid panel noted to be normal within the last year.  No routine lab work done as patient has this done elsewhere.  Follow-up 1 year sooner as needed.   Dara Lords MD, 9:14 AM 10/07/2018

## 2018-10-07 NOTE — Patient Instructions (Signed)
Wean off of the hormones as we discussed.  Call if any issues with this or irregular bleeding continues.

## 2019-05-17 ENCOUNTER — Encounter: Payer: Self-pay | Admitting: Gynecology

## 2019-05-26 ENCOUNTER — Encounter: Payer: Self-pay | Admitting: Family Medicine

## 2019-05-27 ENCOUNTER — Encounter: Payer: Self-pay | Admitting: Family Medicine

## 2019-05-27 ENCOUNTER — Ambulatory Visit (INDEPENDENT_AMBULATORY_CARE_PROVIDER_SITE_OTHER): Payer: BC Managed Care – PPO | Admitting: Family Medicine

## 2019-05-27 ENCOUNTER — Other Ambulatory Visit: Payer: Self-pay | Admitting: Internal Medicine

## 2019-05-27 VITALS — BP 146/105 | HR 78 | Temp 98.6°F | Wt 163.0 lb

## 2019-05-27 DIAGNOSIS — Z20828 Contact with and (suspected) exposure to other viral communicable diseases: Secondary | ICD-10-CM | POA: Diagnosis not present

## 2019-05-27 DIAGNOSIS — R05 Cough: Secondary | ICD-10-CM | POA: Diagnosis not present

## 2019-05-27 DIAGNOSIS — Z20822 Contact with and (suspected) exposure to covid-19: Secondary | ICD-10-CM

## 2019-05-27 DIAGNOSIS — R059 Cough, unspecified: Secondary | ICD-10-CM

## 2019-05-27 MED ORDER — ALBUTEROL SULFATE HFA 108 (90 BASE) MCG/ACT IN AERS: 2.0000 | INHALATION_SPRAY | Freq: Four times a day (QID) | RESPIRATORY_TRACT | 1 refills | Status: DC | PRN

## 2019-05-27 MED ORDER — PREDNISONE 10 MG PO TABS: 30.0000 mg | ORAL_TABLET | Freq: Every day | ORAL | 0 refills | Status: DC

## 2019-05-27 MED ORDER — AZITHROMYCIN 250 MG PO TABS
250.0000 mg | ORAL_TABLET | Freq: Every day | ORAL | 0 refills | Status: DC
Start: 2019-05-27 — End: 2019-09-08

## 2019-05-27 NOTE — Progress Notes (Signed)
Virtual Visit  via Video Note  I connected with      Jessica Mullen by a video enabled telemedicine application and verified that I am speaking with the correct person using two identifiers.   I discussed the limitations of evaluation and management by telemedicine and the availability of in person appointments. The patient expressed understanding and agreed to proceed.  History of Present Illness: Jessica Mullen is a 58 y.o. female who would like to discuss cough and mild subjective fever.  Symptoms started Monday, October 5.  She notes cough is mild and nonproductive.  She denies shortness of breath or wheezing.  She typically gets bronchitis this time a year and thinks that it is a typical bronchitis episode for her.  She is tried some over-the-counter medications.  She does have a prescription of Tessalon Perles at home which she is tried a little bit which helps some.  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  She is careful about using mask at work.     Observations/Objective: BP (!) 146/105   Pulse 78   Temp 98.6 F (37 C) (Oral)   Wt 163 lb (73.9 kg)   BMI 29.81 kg/m  Wt Readings from Last 5 Encounters:  05/27/19 163 lb (73.9 kg)  10/07/18 189 lb (85.7 kg)  08/26/18 186 lb (84.4 kg)  09/28/17 166 lb (75.3 kg)  05/11/17 159 lb (72.1 kg)   Exam: Appearance nontoxic appearing Normal Speech.  Occasional cough no tachypnea or squeezing or severe shortness of breath.  Able to complete sentences.  Lab and Radiology Results No results found for this or any previous visit (from the past 72 hour(s)). No results found.   Assessment and Plan: 58 y.o. female with cough and mild subjective fever.  Obviously concerning for COVID-19.  Plan for outpatient test today.  Treat as bronchitis previously with azithromycin prednisone and albuterol.  Continue over-the-counter medications.  Okay to use [employer] as well.   Recommend abstaining from work until cleared with test result.  PDMP not reviewed this encounter. No orders of the defined types were placed in this encounter.  Meds ordered this encounter  Medications  . azithromycin (ZITHROMAX) 250 MG tablet    Sig: Take 1 tablet (250 mg total) by mouth daily. Take first 2 tablets together, then 1 every day until finished.    Dispense:  6 tablet    Refill:  0  . predniSONE (DELTASONE) 10 MG tablet    Sig: Take 3 tablets (30 mg total) by mouth daily with breakfast.    Dispense:  15 tablet    Refill:  0  . albuterol (VENTOLIN HFA) 108 (90 Base) MCG/ACT inhaler    Sig: Inhale 2 puffs into the lungs every 6 (six) hours as needed for wheezing or shortness of breath.    Dispense:  18 g    Refill:  1    Follow Up Instructions:    I discussed the assessment and treatment plan with the patient. The patient was provided an opportunity to ask questions and all were answered. The patient agreed with the plan and demonstrated an understanding of the instructions.   The patient was advised to call back or seek an in-person evaluation if the symptoms worsen or if the condition fails to improve as anticipated.  Time: 15 minutes of intraservice time, with >22 minutes of total time during today's visit.  Historical information moved to improve visibility of documentation.  Past Medical History:  Diagnosis Date  . Acute meniscal tear of left knee   . CMV (cytomegalovirus infection) (HCC)   . Endometriosis   . Environmental allergies   . IBS (irritable bowel syndrome)   . OA (osteoarthritis) of knee    BILATERAL KNEE  . TMJ (dislocation of temporomandibular joint)   . Wears glasses    Past Surgical History:  Procedure Laterality Date  . APPENDECTOMY    . CESAREAN SECTION  1984  &  1987  . COMBINED LAPAROSCOPY W/ HYSTEROSCOPY  10-31-2005   W/ BX AND ALBATION ENDOMETRIOSIS  . CYSTO/  LEFT URETERAL STENT PLACEMENT  AS CHILD  . EXPLORATORY  LAPAROTOMY/  LEFT SALPINGOOPHORECTOMY/  ABLATION ENDOMETRIOSIS  04-20-2002  . KNEE ARTHROSCOPY Left 10/21/2013   Procedure: LEFT ARTHROSCOPY KNEE WITH DEBRIDEMENT;  Surgeon: Shelda Pal, MD;  Location[facility];  Service: Orthopedics;  Laterality: Left;  . TRANSTHORACIC ECHOCARDIOGRAM  05-09-2012   NORMAL STUDY/  EF 60-65%/  NO EVIDENCE MVP  . URETERAL REIMPLANTION Left AGE 23   Social History   Tobacco Use  . Smoking status: Never Smoker  . Smokeless tobacco: Never Used  Substance Use Topics  . Alcohol use: Yes    Alcohol/week: 0.0 standard drinks    Comment: Rare   family history includes COPD in her father; Cancer in her father and maternal grandfather; Depression in her mother; Diabetes in her father; Heart disease in her father; Hypertension in her mother; Thyroid disease in her mother.  Medications: Current Outpatient Medications  Medication Sig Dispense Refill  . albuterol (VENTOLIN HFA) 108 (90 Base) MCG/ACT inhaler Inhale 2 puffs into the lungs every 6 (six) hours as needed for wheezing or shortness of breath. 18 g 1  . azithromycin (ZITHROMAX) 250 MG tablet Take 1 tablet (250 mg total) by mouth daily. Take first 2 tablets together, then 1 every day until finished. 6 tablet 0  . estradiol (VIVELLE-DOT) 0.1 MG/24HR patch Place 1 patch (0.1 mg total) onto the skin 2 (two) times a week. 24 patch 0  . predniSONE (DELTASONE) 10 MG tablet Take 3 tablets (30 mg total) by mouth daily with breakfast. 15 tablet 0  . progesterone (PROMETRIUM) 100 MG capsule Take 1 capsule (100 mg total) by mouth at bedtime. 30 capsule 11  . valACYclovir (VALTREX) 500 MG tablet Take 1 tablet (500 mg total) by mouth 2 (two) times daily. (Patient not taking: Reported on 10/07/2018) 14 tablet 2   No current facility-administered medications for this visit.    Allergies  Allergen Reactions  . Propoxyphene N-Acetaminophen Shortness Of Breath    REACTION: Suppreses Respirations  . Adhesive  [Tape] Other (See Comments)    "BLOOD BLISTERS"  . Bee Venom     "BEE STINGS"

## 2019-05-29 LAB — NOVEL CORONAVIRUS, NAA: SARS-CoV-2, NAA: NOT DETECTED

## 2019-05-30 ENCOUNTER — Encounter: Payer: Self-pay | Admitting: Gynecology

## 2019-07-21 DIAGNOSIS — S86312A Strain of muscle(s) and tendon(s) of peroneal muscle group at lower leg level, left leg, initial encounter: Secondary | ICD-10-CM | POA: Diagnosis not present

## 2019-07-21 DIAGNOSIS — S86319A Strain of muscle(s) and tendon(s) of peroneal muscle group at lower leg level, unspecified leg, initial encounter: Secondary | ICD-10-CM | POA: Insufficient documentation

## 2019-07-21 DIAGNOSIS — M25572 Pain in left ankle and joints of left foot: Secondary | ICD-10-CM | POA: Diagnosis not present

## 2019-08-11 DIAGNOSIS — M25572 Pain in left ankle and joints of left foot: Secondary | ICD-10-CM | POA: Insufficient documentation

## 2019-08-16 ENCOUNTER — Telehealth: Payer: BC Managed Care – PPO | Admitting: Physician Assistant

## 2019-08-16 ENCOUNTER — Ambulatory Visit: Payer: BC Managed Care – PPO | Attending: Internal Medicine

## 2019-08-16 DIAGNOSIS — Z20828 Contact with and (suspected) exposure to other viral communicable diseases: Secondary | ICD-10-CM | POA: Diagnosis not present

## 2019-08-16 DIAGNOSIS — Z20822 Contact with and (suspected) exposure to covid-19: Secondary | ICD-10-CM

## 2019-08-16 DIAGNOSIS — R6889 Other general symptoms and signs: Secondary | ICD-10-CM

## 2019-08-16 MED ORDER — PHENOL 1.4 % MT LIQD: 1.0000 | OROMUCOSAL | 0 refills | Status: DC | PRN

## 2019-08-16 MED ORDER — BENZONATATE 100 MG PO CAPS: 100.0000 mg | ORAL_CAPSULE | Freq: Three times a day (TID) | ORAL | 0 refills | Status: DC | PRN

## 2019-08-16 NOTE — Progress Notes (Signed)
E-Visit for Corona Virus Screening   Your current symptoms could be consistent with the coronavirus.  Many health care providers can now test patients at their office but not all are.  [facility] has multiple testing sites. For information on our COVID testing locations and hours go to [email or web address]  You can use medication such as A prescription cough medication called Tessalon Perles 100 mg. You may take 1-2 capsules every 8 hours as needed for cough. You can also use phenol throat spray, warm water salt gargles, tea with honey, and otc lozenges for sore throat.   You may also take acetaminophen (Tylenol) as needed for fever, 1-2 tablets every 6 hours as needed.  Testing Information: The COVID-19 Community Testing sites will begin testing BY APPOINTMENT ONLY.  You can schedule online at [email or web address]  If you do not have access to a smart phone or computer you may call [phone] for an appointment.  Testing Locations: Appointment schedule is 8 am to 3:30 pm at all sites  [facility] indoors at [address] [place] [address] [facility]  indoors at [place]. [address] [place], [place] [address] [place] indoors at [address], [place] [address]  Additional testing sites in the Community:  . For [facility] Testing sites in [facility]  FaceUpdate.[email or web address]  . For Pop-up testing sites in [place]  BowlDirectory.co.uk  . For Testing sites with regular hours [email or web address]  . For [place] [email or web address]  . For [facility] [email or web address]  . For [facility] testing in [place] and [employer]  [email or web address]  . For [facility] testing in [facility]   [email or web address]  For  more information about community testing call [phone]   We are enrolling you in our [place] for [place] . Daily you will receive a questionnaire within the [facility] website. Our COVID 19 response team will be monitoring your responses daily.  Please quarantine yourself while awaiting your test results. If you develop fever/cough/breathlessness, please stay home for 10 days with improving symptoms and until you have had 24 hours of no fever (without taking a fever reducer).  You should wear a mask or cloth face covering over your nose and mouth if you must be around other people or animals, including pets (even at home). Try to stay at least 6 feet away from other people. This will protect the people around you.  Please continue good preventive care measures, including:  frequent hand-washing, avoid touching your face, cover coughs/sneezes, stay out of crowds and keep a 6 foot distance from others.  COVID-19 is a respiratory illness with symptoms that are similar to the flu. Symptoms are typically mild to moderate, but there have been cases of severe illness and death due to the virus.   The following symptoms may appear 2-14 days after exposure: . Fever . Cough . Shortness of breath or difficulty breathing . Chills . Repeated shaking with chills . Muscle pain . Headache . Sore throat . New loss of taste or smell . Fatigue . Congestion or runny nose . Nausea or vomiting . Diarrhea  Go to the nearest hospital ED for assessment if fever/cough/breathlessness are severe or illness seems like a threat to life.  It is vitally important that if you feel that you have an infection such as this virus or any other virus that you stay home and away from places where you may spread it to  others.  You should avoid  contact with people age 39 and older.     Reduce your risk of any infection by using the same precautions used for avoiding the common cold or flu:  Marland Kitchen Wash your hands often with soap and warm water for at least 20 seconds.  If soap and water are not readily available, use an alcohol-based hand sanitizer with at least 60% alcohol.  . If coughing or sneezing, cover your mouth and nose by coughing or sneezing into the elbow areas of your shirt or coat, into a tissue or into your sleeve (not your hands). . Avoid shaking hands with others and consider head nods or verbal greetings only. . Avoid touching your eyes, nose, or mouth with unwashed hands.  . Avoid close contact with people who are sick. . Avoid places or events with large numbers of people in one location, like concerts or sporting events. . Carefully consider travel plans you have or are making. . If you are planning any travel outside or inside the [place], visit the CDC's Travelers' Health webpage for the latest health notices. . If you have some symptoms but not all symptoms, continue to monitor at home and seek medical attention if your symptoms worsen. . If you are having a medical emergency, call 911.  HOME CARE . Only take medications as instructed by your medical team. . Drink plenty of fluids and get plenty of rest. . A steam or ultrasonic humidifier can help if you have congestion.   GET HELP RIGHT AWAY IF YOU HAVE EMERGENCY WARNING SIGNS** FOR COVID-19. If you or someone is showing any of these signs seek emergency medical care immediately. Call 911 or proceed to your closest emergency facility if: . You develop worsening high fever. . Trouble breathing . Bluish lips or face . Persistent pain or pressure in the chest . New confusion . Inability to wake or stay awake . You cough up blood. . Your symptoms become more severe  **This list is not all possible symptoms. Contact your medical provider for  any symptoms that are sever or concerning to you.  MAKE SURE YOU   Understand these instructions.  Will watch your condition.  Will get help right away if you are not doing well or get worse.  Your e-visit answers were reviewed by a board certified advanced clinical practitioner to complete your personal care plan.  Depending on the condition, your plan could have included both over the counter or prescription medications.  If there is a problem please reply once you have received a response from your provider.  Your safety is important to [place].  If you have drug allergies check your prescription carefully.    You can use MyChart to ask questions about today's visit, request a non-urgent call back, or ask for a work or school excuse for 24 hours related to this e-Visit. If it has been greater than 24 hours you will need to follow up with your provider, or enter a new e-Visit to address those concerns. You will get an e-mail in the next two days asking about your experience.  I hope that your e-visit has been valuable and will speed your recovery. Thank you for using e-visits.  Greater than 5 minutes, yet less than 10 minutes of time have been spent researching, coordinating, and implementing care for this patient today.

## 2019-08-17 LAB — NOVEL CORONAVIRUS, NAA: SARS-CoV-2, NAA: NOT DETECTED

## 2019-08-18 ENCOUNTER — Encounter: Payer: Self-pay | Admitting: Family Medicine

## 2019-08-22 NOTE — Telephone Encounter (Signed)
OK to return to work. Can provide note if needed.

## 2019-08-30 ENCOUNTER — Encounter: Payer: Self-pay | Admitting: Family Medicine

## 2019-08-31 ENCOUNTER — Ambulatory Visit (INDEPENDENT_AMBULATORY_CARE_PROVIDER_SITE_OTHER): Payer: PRIVATE HEALTH INSURANCE | Admitting: Nurse Practitioner

## 2019-08-31 ENCOUNTER — Encounter: Payer: Self-pay | Admitting: Nurse Practitioner

## 2019-08-31 VITALS — HR 70 | Wt 163.0 lb

## 2019-08-31 DIAGNOSIS — Z20822 Contact with and (suspected) exposure to covid-19: Secondary | ICD-10-CM

## 2019-08-31 MED ORDER — PREDNISONE 20 MG PO TABS: 40.0000 mg | ORAL_TABLET | Freq: Every day | ORAL | 0 refills | Status: AC

## 2019-08-31 MED ORDER — HYDROCOD POLST-CPM POLST ER 10-8 MG/5ML PO SUER: 5.0000 mL | Freq: Two times a day (BID) | ORAL | 0 refills | Status: AC | PRN

## 2019-08-31 NOTE — Progress Notes (Signed)
Virtual Visit via Video Note  I connected with Jessica Mullen on 08/31/19 at 11:00 AM EST by the video enabled telemedicine application, Doximity, and verified that I am speaking with the correct person using two identifiers.   I discussed the limitations of evaluation and management by telemedicine and the availability of in person appointments. The patient expressed understanding and agreed to proceed.  Subjective:    CC: COVID-19 exposure and symptoms  HPI: Jessica Mullen is a 59 y.o. y/o female presenting via Doximity today for COVID-19 symptoms. She has had direct exposure from husband, who is positive. She first experienced symptoms of bronchitis in December 2020, which she was managing at home. Her symptoms started improving slightly and then began to get worse again. She had a prescription for azithromycin from a previous visit that she had not taken. She began taking this on Monday (06/28/2020) and feels that some of her symptoms have improved. She has a COVID test scheduled for tomorrow.  Worst Symptom: cough- keeping up at night- having to sleep sitting up Cough: yes  Productive: yes- white/yellow Shortness of Breath: yes- mostly with exertion  Wheezing: yes and hearing crackles Fever: has not taken temperature- but feeling feverish Rhinorrhea: no Congestion: yes  Post nasal drip: no Sore throat: improved  Sinus pain/pressure: no Eye pain/pressure: yes Ear pain/pressure: no Fatigue: yes  N/V/D: nausea- increased with cough Body aches: yes- "skin hurts"  Loss of taste: change in taste Loss of smell: no Symptoms improving/worsening: some improving and some worsening Medications tried: dayquil, nyquil, mucinex, azithromycin.  Past medical history, Surgical history, Family history not pertinant except as noted below, Social history, Allergies, and medications have been entered into the medical record, reviewed, and corrections made.   Review of Systems:  General: No  weight loss.   Neuro: No numbness, paresthesias, or change in mental status  HEENT: No sinus pain/pressure, ear pain/pressure, difficulty swallowing, vision changes, epistaxis, loss of taste, loss of smell Pulmonary/CV: No chest pain, edema, palpitations, syncope GI: No abdominal pain, vomiting, diarrhea, changes in bowel habits, anorexia   Objective:    General: Speaking clearly in complete sentences without any shortness of breath.  Alert and oriented x3.  Normal judgment. No apparent acute distress. Unable to perform physical exam over video platform  Impression and Recommendations:    1. Suspected 2019-nCoV infection Highly probable the patient is COVID positive given her husbands positive status. Recommend complete azithromycin prescription. Information provided on supportive care for COVID symptoms. Scripts sent for tussionex and prednisone to help with symptoms. Pt instructed to call for worsening symptoms or if symptoms fail to improve.   - chlorpheniramine-HYDROcodone (TUSSIONEX) 10-8 MG/5ML SUER; Take 5 mLs by mouth every 12 (twelve) hours as needed for up to 7 days for cough (cough, will cause drowsiness.).  Dispense: 70 mL; Refill: 0 - predniSONE (DELTASONE) 20 MG tablet; Take 2 tablets (40 mg total) by mouth daily with breakfast for 5 days.  Dispense: 10 tablet; Refill: 0   I discussed the assessment and treatment plan with the patient. The patient was provided an opportunity to ask questions and all were answered. The patient agreed with the plan and demonstrated an understanding of the instructions.   The patient was advised to call back or seek an in-person evaluation if the symptoms worsen or if the condition fails to improve as anticipated.   Tollie Eth, NP

## 2019-08-31 NOTE — Patient Instructions (Signed)
Symptom Management for COVID-19  Congestion: Guaifenesin (Mucinex)- follow directions on packaging with a maximum dose of 2400mg in a 24 hour period.  Pain/Fever: Ibuprofen 200mg - 400mg every 4-6 hours as needed (MAX 1200mg in a 24 hour period) Pain/Fever: Tylenol 500mg -1000mg every 6-8 hours as needed (MAX 3000mg in a 24 hour period)  Cough: Dextromethorphan (Delsym)- follow directions on packing with a maximum dose of 120mg in a 24 hour period.  Nasal Stuffiness: Saline nasal spray and/or Nettie Pot with sterile saline solution  Runny Nose: Fluticasone nasal spray (Flonase) OR Mometasone nasal spray (Nasonex) OR Triamcinolone Acetonide nasal spray (Nasacort)- follow directions on the packaging  Pain/Pressure: Warm washcloth to the face  Sore Throat: Warm salt water gargles  If you have allergies, you may also consider taking an oral antihistamine (like Zyrtec or Claritin) as these may also help with your symptoms.  **Many medications will have more than one ingredient, be sure you are reading the packaging carefully and not taking more than one dose of the same kind of medication at the same time or too close together. It is OK to use formulas that have all of the ingredients you want, but do not take them in a combined medication and as separate dose too close together. If you have any questions, the pharmacist will be happy to help you decide what is safe.  If you begin to have worsening shortness of breath, elevated temperatures not responding to over-the-counter medications, or symptoms you are unable to manage at home, please seek further evaluation or care at your nearest emergency room.    COVID-19 COVID-19 is a respiratory infection that is caused by a virus called severe acute respiratory syndrome coronavirus 2 (SARS-CoV-2). The disease is also known as coronavirus disease or novel coronavirus. In some people, the virus may not cause any symptoms. In others, it may cause a  serious infection. The infection can get worse quickly and can lead to complications, such as:  Pneumonia, or infection of the lungs.  Acute respiratory distress syndrome or ARDS. This is a condition in which fluid build-up in the lungs prevents the lungs from filling with air and passing oxygen into the blood.  Acute respiratory failure. This is a condition in which there is not enough oxygen passing from the lungs to the body or when carbon dioxide is not passing from the lungs out of the body.  Sepsis or septic shock. This is a serious bodily reaction to an infection.  Blood clotting problems.  Secondary infections due to bacteria or fungus.  Organ failure. This is when your body's organs stop working. The virus that causes COVID-19 is contagious. This means that it can spread from person to person through droplets from coughs and sneezes (respiratory secretions). What are the causes? This illness is caused by a virus. You may catch the virus by:  Breathing in droplets from an infected person. Droplets can be spread by a person breathing, speaking, singing, coughing, or sneezing.  Touching something, like a table or a doorknob, that was exposed to the virus (contaminated) and then touching your mouth, nose, or eyes. What increases the risk? Risk for infection You are more likely to be infected with this virus if you:  Are within 6 feet (2 meters) of a person with COVID-19.  Provide care for or live with a person who is infected with COVID-19.  Spend time in crowded indoor spaces or live in shared housing. Risk for serious illness You are   more likely to become seriously ill from the virus if you:  Are 50 years of age or older. The higher your age, the more you are at risk for serious illness.  Live in a nursing home or long-term care facility.  Have cancer.  Have a long-term (chronic) disease such as: ? Chronic lung disease, including chronic obstructive pulmonary disease  or asthma. ? A long-term disease that lowers your body's ability to fight infection (immunocompromised). ? Heart disease, including heart failure, a condition in which the arteries that lead to the heart become narrow or blocked (coronary artery disease), a disease which makes the heart muscle thick, weak, or stiff (cardiomyopathy). ? Diabetes. ? Chronic kidney disease. ? Sickle cell disease, a condition in which red blood cells have an abnormal "sickle" shape. ? Liver disease.  Are obese. What are the signs or symptoms? Symptoms of this condition can range from mild to severe. Symptoms may appear any time from 2 to 14 days after being exposed to the virus. They include:  A fever or chills.  A cough.  Difficulty breathing.  Headaches, body aches, or muscle aches.  Runny or stuffy (congested) nose.  A sore throat.  New loss of taste or smell. Some people may also have stomach problems, such as nausea, vomiting, or diarrhea. Other people may not have any symptoms of COVID-19. How is this diagnosed? This condition may be diagnosed based on:  Your signs and symptoms, especially if: ? You live in an area with a COVID-19 outbreak. ? You recently traveled to or from an area where the virus is common. ? You provide care for or live with a person who was diagnosed with COVID-19. ? You were exposed to a person who was diagnosed with COVID-19.  A physical exam.  Lab tests, which may include: ? Taking a sample of fluid from the back of your nose and throat (nasopharyngeal fluid), your nose, or your throat using a swab. ? A sample of mucus from your lungs (sputum). ? Blood tests.  Imaging tests, which may include, X-rays, CT scan, or ultrasound. How is this treated? At present, there is no medicine to treat COVID-19. Medicines that treat other diseases are being used on a trial basis to see if they are effective against COVID-19. Your health care provider will talk with you about  ways to treat your symptoms. For most people, the infection is mild and can be managed at home with rest, fluids, and over-the-counter medicines. Treatment for a serious infection usually takes places in a hospital intensive care unit (ICU). It may include one or more of the following treatments. These treatments are given until your symptoms improve.  Receiving fluids and medicines through an IV.  Supplemental oxygen. Extra oxygen is given through a tube in the nose, a face mask, or a hood.  Positioning you to lie on your stomach (prone position). This makes it easier for oxygen to get into the lungs.  Continuous positive airway pressure (CPAP) or bi-level positive airway pressure (BPAP) machine. This treatment uses mild air pressure to keep the airways open. A tube that is connected to a motor delivers oxygen to the body.  Ventilator. This treatment moves air into and out of the lungs by using a tube that is placed in your windpipe.  Tracheostomy. This is a procedure to create a hole in the neck so that a breathing tube can be inserted.  Extracorporeal membrane oxygenation (ECMO). This procedure gives the lungs a chance to   recover by taking over the functions of the heart and lungs. It supplies oxygen to the body and removes carbon dioxide. Follow these instructions at home: Lifestyle  If you are sick, stay home except to get medical care. Your health care provider will tell you how long to stay home. Call your health care provider before you go for medical care.  Rest at home as told by your health care provider.  Do not use any products that contain nicotine or tobacco, such as cigarettes, e-cigarettes, and chewing tobacco. If you need help quitting, ask your health care provider.  Return to your normal activities as told by your health care provider. Ask your health care provider what activities are safe for you. General instructions  Take over-the-counter and prescription medicines  only as told by your health care provider.  Drink enough fluid to keep your urine pale yellow.  Keep all follow-up visits as told by your health care provider. This is important. How is this prevented?  There is no vaccine to help prevent COVID-19 infection. However, there are steps you can take to protect yourself and others from this virus. To protect yourself:   Do not travel to areas where COVID-19 is a risk. The areas where COVID-19 is reported change often. To identify high-risk areas and travel restrictions, check the CDC travel website: wwwnc.cdc.gov/travel/notices  If you live in, or must travel to, an area where COVID-19 is a risk, take precautions to avoid infection. ? Stay away from people who are sick. ? Wash your hands often with soap and water for 20 seconds. If soap and water are not available, use an alcohol-based hand sanitizer. ? Avoid touching your mouth, face, eyes, or nose. ? Avoid going out in public, follow guidance from your state and local health authorities. ? If you must go out in public, wear a cloth face covering or face mask. Make sure your mask covers your nose and mouth. ? Avoid crowded indoor spaces. Stay at least 6 feet (2 meters) away from others. ? Disinfect objects and surfaces that are frequently touched every day. This may include:  Counters and tables.  Doorknobs and light switches.  Sinks and faucets.  Electronics, such as phones, remote controls, keyboards, computers, and tablets. To protect others: If you have symptoms of COVID-19, take steps to prevent the virus from spreading to others.  If you think you have a COVID-19 infection, contact your health care provider right away. Tell your health care team that you think you may have a COVID-19 infection.  Stay home. Leave your house only to seek medical care. Do not use public transport.  Do not travel while you are sick.  Wash your hands often with soap and water for 20 seconds. If soap  and water are not available, use alcohol-based hand sanitizer.  Stay away from other members of your household. Let healthy household members care for children and pets, if possible. If you have to care for children or pets, wash your hands often and wear a mask. If possible, stay in your own room, separate from others. Use a different bathroom.  Make sure that all people in your household wash their hands well and often.  Cough or sneeze into a tissue or your sleeve or elbow. Do not cough or sneeze into your hand or into the air.  Wear a cloth face covering or face mask. Make sure your mask covers your nose and mouth. Where to find more information  Centers for   Disease Control and Prevention: www.cdc.gov/coronavirus/2019-ncov/index.html  World Health Organization: www.who.int/health-topics/coronavirus Contact a health care provider if:  You live in or have traveled to an area where COVID-19 is a risk and you have symptoms of the infection.  You have had contact with someone who has COVID-19 and you have symptoms of the infection. Get help right away if:  You have trouble breathing.  You have pain or pressure in your chest.  You have confusion.  You have bluish lips and fingernails.  You have difficulty waking from sleep.  You have symptoms that get worse. These symptoms may represent a serious problem that is an emergency. Do not wait to see if the symptoms will go away. Get medical help right away. Call your local emergency services (911 in the [place] Do not drive yourself to the hospital. Let the emergency medical personnel know if you think you have COVID-19. Summary  COVID-19 is a respiratory infection that is caused by a virus. It is also known as coronavirus disease or novel coronavirus. It can cause serious infections, such as pneumonia, acute respiratory distress syndrome, acute respiratory failure, or sepsis.  The virus that causes COVID-19 is contagious. This means  that it can spread from person to person through droplets from breathing, speaking, singing, coughing, or sneezing.  You are more likely to develop a serious illness if you are 50 years of age or older, have a weak immune system, live in a nursing home, or have chronic disease.  There is no medicine to treat COVID-19. Your health care provider will talk with you about ways to treat your symptoms.  Take steps to protect yourself and others from infection. Wash your hands often and disinfect objects and surfaces that are frequently touched every day. Stay away from people who are sick and wear a mask if you are sick. This information is not intended to replace advice given to you by your health care provider. Make sure you discuss any questions you have with your health care provider. Document Revised: 06/03/2019 Document Reviewed: 09/09/2018 Elsevier Patient Education  2020 [employer]vier Inc.       

## 2019-09-01 ENCOUNTER — Ambulatory Visit: Payer: No Typology Code available for payment source | Attending: Internal Medicine

## 2019-09-01 DIAGNOSIS — Z20822 Contact with and (suspected) exposure to covid-19: Secondary | ICD-10-CM

## 2019-09-02 LAB — NOVEL CORONAVIRUS, NAA: SARS-CoV-2, NAA: DETECTED — AB

## 2019-09-08 ENCOUNTER — Telehealth (INDEPENDENT_AMBULATORY_CARE_PROVIDER_SITE_OTHER): Payer: PRIVATE HEALTH INSURANCE | Admitting: Family Medicine

## 2019-09-08 DIAGNOSIS — B009 Herpesviral infection, unspecified: Secondary | ICD-10-CM

## 2019-09-08 DIAGNOSIS — R059 Cough, unspecified: Secondary | ICD-10-CM

## 2019-09-08 DIAGNOSIS — R05 Cough: Secondary | ICD-10-CM

## 2019-09-08 DIAGNOSIS — U071 COVID-19: Secondary | ICD-10-CM | POA: Diagnosis not present

## 2019-09-08 MED ORDER — VALACYCLOVIR HCL 500 MG PO TABS: 500.0000 mg | ORAL_TABLET | Freq: Two times a day (BID) | ORAL | 3 refills | Status: DC

## 2019-09-08 NOTE — Progress Notes (Signed)
Pt is concerned about whether or not she should be taking any ABX. She stated that she has been sick for the past month.  She was Dx with shingles in 2017. She states that she has been experiencing a flare. She has noticed a lesion that popped up 2 days ago. She tested positive for COVID 1 week ago. She also c/o a frontal/temperol headache that comes and goes. Denies any visual changes,or facial pain. She has been taking Extra Strength Tylenol for this.   She has a prescription for Valacyclovir 500 mg and she took what she had left a few days ago. She denies any pain associated with the shingles only discomfort.

## 2019-09-08 NOTE — Progress Notes (Addendum)
Virtual Visit via Video Note  I connected with Kathlyn Sacramento on 09/08/19 at  1:20 PM EST by a video enabled telemedicine application and verified that I am speaking with the correct person using two identifiers.   I discussed the limitations of evaluation and management by telemedicine and the availability of in person appointments. The patient expressed understanding and agreed to proceed.  Subjective:    CC: Still not feeling well.    HPI: Pt is concerned about whether or not she should be taking any ABX. He has had bronchitis and not feeling well for months. She stated that she has been sick for the past month. Her husband was positive first and he works in the ICU. She has a cough and skin hurts.  + frontal HA.  No drainage. No fever. She is starting to feel better in the last couple of days.    She was Dx with shingles in 2017. She states that she has been experiencing a flare. She has noticed a lesion that popped up 2 days ago on her low back just above the buttock crease.   She tested positive for COVID 1 week ago. She also c/o a frontal/temperol headache that comes and goes. Denies any visual changes,or facial pain. She has been taking Extra Strength Tylenol for this. She has a prescription for Valacyclovir 500 mg and she took what she had left a few days ago. She denies any pain associated with the shingles only discomfort.      Past medical history, Surgical history, Family history not pertinant except as noted below, Social history, Allergies, and medications have been entered into the medical record, reviewed, and corrections made.   Review of Systems: No fevers, chills, night sweats, weight loss, chest pain, or shortness of breath.   Objective:    General: Speaking clearly in complete sentences without any shortness of breath.  Alert and oriented x3.  Normal judgment. No apparent acute distress.    Impression and Recommendations:    Cough status post Covid-just gave  her reassurance that it will likely take weeks for the cough to completely resolve.  She did improved with a round of prednisone recently.  Okay to use over-the-counter cough medications.  He also discussed that sometimes when you have been coughing for weeks to even months it can actually cause reflux symptoms which can then worsen the coughs.  So also recommended a trial of an over-the-counter PPI for 10 days.  If not helpful then discontinue at that point in time.  Okay to use albuterol as needed.  Herpes simplex virus-did go ahead and refill her Valtrex and sent to pharmacy.  Call if not improving after the weekend.   Time spent 21 min in encounter.    I discussed the assessment and treatment plan with the patient. The patient was provided an opportunity to ask questions and all were answered. The patient agreed with the plan and demonstrated an understanding of the instructions.   The patient was advised to call back or seek an in-person evaluation if the symptoms worsen or if the condition fails to improve as anticipated.   Nani Gasser, MD

## 2019-09-29 ENCOUNTER — Encounter: Payer: Self-pay | Admitting: Family Medicine

## 2019-09-29 ENCOUNTER — Ambulatory Visit (INDEPENDENT_AMBULATORY_CARE_PROVIDER_SITE_OTHER): Payer: PRIVATE HEALTH INSURANCE | Admitting: Family Medicine

## 2019-09-29 ENCOUNTER — Other Ambulatory Visit: Payer: Self-pay

## 2019-09-29 VITALS — BP 122/76 | HR 74 | Ht 62.0 in | Wt 165.0 lb

## 2019-09-29 DIAGNOSIS — Z1231 Encounter for screening mammogram for malignant neoplasm of breast: Secondary | ICD-10-CM

## 2019-09-29 DIAGNOSIS — Z23 Encounter for immunization: Secondary | ICD-10-CM | POA: Diagnosis not present

## 2019-09-29 DIAGNOSIS — Z0000 Encounter for general adult medical examination without abnormal findings: Secondary | ICD-10-CM

## 2019-09-29 NOTE — Progress Notes (Signed)
Subjective:     Jessica Mullen is a 59 y.o. female and is here for a comprehensive physical exam. The patient reports no problems.  She is doing well overall.  She walks about 5 miles a day at work but has not been able to run like she used to because of an injury to her left knee she says in fact she was told not to run and then at some point she may have to have a partial knee replacement.  She says she is finally feeling better.  Unfortunately in December she had bronchitis and developed Covid, and then broke out with shingles.  Social History   Socioeconomic History  . Marital status: Married    Spouse name: Not on file  . Number of children: Not on file  . Years of education: Not on file  . Highest education level: Not on file  Occupational History  . Occupation: radiology    Employer: [employer]  Tobacco Use  . Smoking status: Never Smoker  . Smokeless tobacco: Never Used  Substance and Sexual Activity  . Alcohol use: Yes    Alcohol/week: 0.0 standard drinks    Comment: Rare  . Drug use: No  . Sexual activity: Yes    Birth control/protection: Other-see comments    Comment: VASECTOMY-1st intercourse 59 yo-Fewer than 5 partners  Other Topics Concern  . Not on file  Social History Narrative   Radiology tech   Very healthy-runs 5 miles a day   Usually runs low blood pressures      Lives at home with Husband   Social Determinants of Health   Financial Resource Strain:   . Difficulty of Paying Living Expenses: Not on file  Food Insecurity:   . Worried About Charity fundraiser in the Last Year: Not on file  . Ran Out of Food in the Last Year: Not on file  Transportation Needs:   . Lack of Transportation (Medical): Not on file  . Lack of Transportation (Non-Medical): Not on file  Physical Activity:   . Days of Exercise per Week: Not on file  . Minutes of Exercise per Session: Not on file  Stress:   . Feeling of Stress : Not on file  Social  Connections:   . Frequency of Communication with Friends and Family: Not on file  . Frequency of Social Gatherings with Friends and Family: Not on file  . Attends Religious Services: Not on file  . Active Member of Clubs or Organizations: Not on file  . Attends Archivist Meetings: Not on file  . Marital Status: Not on file  Intimate Partner Violence:   . Fear of Current or Ex-Partner: Not on file  . Emotionally Abused: Not on file  . Physically Abused: Not on file  . Sexually Abused: Not on file   Health Maintenance  Topic Date Due  . MAMMOGRAM  10/08/2018  . PAP SMEAR-Modifier  05/11/2020  . COLONOSCOPY  07/24/2026  . TETANUS/TDAP  09/28/2029  . INFLUENZA VACCINE  Completed  . Hepatitis C Screening  Completed  . HIV Screening  Completed    The following portions of the patient's history were reviewed and updated as appropriate: allergies, current medications, past family history, past medical history, past social history, past surgical history and problem list.  Review of Systems A comprehensive review of systems was negative.   Objective:    BP 122/76   Pulse 74   Ht 5\' 2"  (1.575 m)  Wt 165 lb (74.8 kg)   SpO2 99%   BMI 30.18 kg/m  General appearance: alert, cooperative and appears stated age Head: Normocephalic, without obvious abnormality, atraumatic Eyes: conj clear, EOMI, PEERLA Ears: normal TM's and external ear canals both ears Nose: Nares normal. Septum midline. Mucosa normal. No drainage or sinus tenderness. Throat: lips, mucosa, and tongue normal; teeth and gums normal Neck: no adenopathy, no carotid bruit, no JVD, supple, symmetrical, trachea midline and thyroid not enlarged, symmetric, no tenderness/mass/nodules Back: symmetric, no curvature. ROM normal. No CVA tenderness. Lungs: clear to auscultation bilaterally Heart: regular rate and rhythm, S1, S2 normal, no murmur, click, rub or gallop Abdomen: soft, non-tender; bowel sounds normal; no  masses,  no organomegaly Extremities: extremities normal, atraumatic, no cyanosis or edema Pulses: 2+ and symmetric Skin: Skin color, texture, turgor normal. No rashes or lesions Lymph nodes: Cervical adenopathy: nl and Supraclavicular adenopathy: nl Neurologic: Alert and oriented X 3, normal strength and tone. Normal symmetric reflexes. Normal coordination and gait    Assessment:    Healthy female exam.      Plan:     See After Visit Summary for Counseling Recommendations    Keep up a regular exercise program and make sure you are eating a healthy diet Try to eat 4 servings of dairy a day, or if you are lactose intolerant take a calcium with vitamin D daily.  Your vaccines are up to date.  Given Tdap today.  Will wait for shingles until the spring since just had shingles in December.   Labs ordered.    Get in with Dr. Audie Box for Pap smear and getting mammogram up-to-date.

## 2019-09-29 NOTE — Patient Instructions (Signed)
Health Maintenance, Female Adopting a healthy lifestyle and getting preventive care are important in promoting health and wellness. Ask your health care provider about:  The right schedule for you to have regular tests and exams.  Things you can do on your own to prevent diseases and keep yourself healthy. What should I know about diet, weight, and exercise? Eat a healthy diet   Eat a diet that includes plenty of vegetables, fruits, low-fat dairy products, and lean protein.  Do not eat a lot of foods that are high in solid fats, added sugars, or sodium. Maintain a healthy weight Body mass index (BMI) is used to identify weight problems. It estimates body fat based on height and weight. Your health care provider can help determine your BMI and help you achieve or maintain a healthy weight. Get regular exercise Get regular exercise. This is one of the most important things you can do for your health. Most adults should:  Exercise for at least 150 minutes each week. The exercise should increase your heart rate and make you sweat (moderate-intensity exercise).  Do strengthening exercises at least twice a week. This is in addition to the moderate-intensity exercise.  Spend less time sitting. Even light physical activity can be beneficial. Watch cholesterol and blood lipids Have your blood tested for lipids and cholesterol at 57 years of age, then have this test every 5 years. Have your cholesterol levels checked more often if:  Your lipid or cholesterol levels are high.  You are older than 57 years of age.  You are at high risk for heart disease. What should I know about cancer screening? Depending on your health history and family history, you may need to have cancer screening at various ages. This may include screening for:  Breast cancer.  Cervical cancer.  Colorectal cancer.  Skin cancer.  Lung cancer. What should I know about heart disease, diabetes, and high blood  pressure? Blood pressure and heart disease  High blood pressure causes heart disease and increases the risk of stroke. This is more likely to develop in people who have high blood pressure readings, are of African descent, or are overweight.  Have your blood pressure checked: ? Every 3-5 years if you are 18-39 years of age. ? Every year if you are 40 years old or older. Diabetes Have regular diabetes screenings. This checks your fasting blood sugar level. Have the screening done:  Once every three years after age 40 if you are at a normal weight and have a low risk for diabetes.  More often and at a younger age if you are overweight or have a high risk for diabetes. What should I know about preventing infection? Hepatitis B If you have a higher risk for hepatitis B, you should be screened for this virus. Talk with your health care provider to find out if you are at risk for hepatitis B infection. Hepatitis C Testing is recommended for:  Everyone born from 1945 through 1965.  Anyone with known risk factors for hepatitis C. Sexually transmitted infections (STIs)  Get screened for STIs, including gonorrhea and chlamydia, if: ? You are sexually active and are younger than 57 years of age. ? You are older than 57 years of age and your health care provider tells you that you are at risk for this type of infection. ? Your sexual activity has changed since you were last screened, and you are at increased risk for chlamydia or gonorrhea. Ask your health care provider if   you are at risk.  Ask your health care provider about whether you are at high risk for HIV. Your health care provider may recommend a prescription medicine to help prevent HIV infection. If you choose to take medicine to prevent HIV, you should first get tested for HIV. You should then be tested every 3 months for as long as you are taking the medicine. Pregnancy  If you are about to stop having your period (premenopausal) and  you may become pregnant, seek counseling before you get pregnant.  Take 400 to 800 micrograms (mcg) of folic acid every day if you become pregnant.  Ask for birth control (contraception) if you want to prevent pregnancy. Osteoporosis and menopause Osteoporosis is a disease in which the bones lose minerals and strength with aging. This can result in bone fractures. If you are 65 years old or older, or if you are at risk for osteoporosis and fractures, ask your health care provider if you should:  Be screened for bone loss.  Take a calcium or vitamin D supplement to lower your risk of fractures.  Be given hormone replacement therapy (HRT) to treat symptoms of menopause. Follow these instructions at home: Lifestyle  Do not use any products that contain nicotine or tobacco, such as cigarettes, e-cigarettes, and chewing tobacco. If you need help quitting, ask your health care provider.  Do not use street drugs.  Do not share needles.  Ask your health care provider for help if you need support or information about quitting drugs. Alcohol use  Do not drink alcohol if: ? Your health care provider tells you not to drink. ? You are pregnant, may be pregnant, or are planning to become pregnant.  If you drink alcohol: ? Limit how much you use to 0-1 drink a day. ? Limit intake if you are breastfeeding.  Be aware of how much alcohol is in your drink. In the [place] one drink equals one 12 oz bottle of beer (355 mL), one 5 oz glass of wine (148 mL), or one 1 oz glass of hard liquor (44 mL). General instructions  Schedule regular health, dental, and eye exams.  Stay current with your vaccines.  Tell your health care provider if: ? You often feel depressed. ? You have ever been abused or do not feel safe at home. Summary  Adopting a healthy lifestyle and getting preventive care are important in promoting health and wellness.  Follow your health care provider's instructions about healthy  diet, exercising, and getting tested or screened for diseases.  Follow your health care provider's instructions on monitoring your cholesterol and blood pressure. This information is not intended to replace advice given to you by your health care provider. Make sure you discuss any questions you have with your health care provider. Document Revised: 07/28/2018 Document Reviewed: 07/28/2018 Elsevier Patient Education  2020 Elsevier Inc.  

## 2019-09-30 LAB — LIPID PANEL W/REFLEX DIRECT LDL
Cholesterol: 200 mg/dL — ABNORMAL HIGH (ref <200)
HDL: 59 mg/dL (ref >=50)
LDL Cholesterol (Calc): 118 mg/dL (calc) — ABNORMAL HIGH
Non-HDL Cholesterol (Calc): 141 mg/dL (calc) — ABNORMAL HIGH (ref <130)
Total CHOL/HDL Ratio: 3.4 (calc) (ref <5.0)
Triglycerides: 124 mg/dL (ref <150)

## 2019-09-30 LAB — COMPLETE METABOLIC PANEL WITH GFR
AG Ratio: 1.7 (calc) (ref 1.0–2.5)
ALT: 13 U/L (ref 6–29)
AST: 16 U/L (ref 10–35)
Albumin: 4.5 g/dL (ref 3.6–5.1)
Alkaline phosphatase (APISO): 69 U/L (ref 37–153)
BUN: 22 mg/dL (ref 7–25)
CO2: 31 mmol/L (ref 20–32)
Calcium: 9.9 mg/dL (ref 8.6–10.4)
Chloride: 102 mmol/L (ref 98–110)
Creat: 0.83 mg/dL (ref 0.50–1.05)
GFR, Est African American: 91 mL/min/{1.73_m2} (ref >=60)
GFR, Est Non African American: 79 mL/min/{1.73_m2} (ref >=60)
Globulin: 2.6 g/dL (calc) (ref 1.9–3.7)
Glucose, Bld: 98 mg/dL (ref 65–139)
Potassium: 4.4 mmol/L (ref 3.5–5.3)
Sodium: 140 mmol/L (ref 135–146)
Total Bilirubin: 0.5 mg/dL (ref 0.2–1.2)
Total Protein: 7.1 g/dL (ref 6.1–8.1)

## 2019-09-30 LAB — TSH: TSH: 3.02 mIU/L (ref 0.40–4.50)

## 2019-10-05 ENCOUNTER — Ambulatory Visit (INDEPENDENT_AMBULATORY_CARE_PROVIDER_SITE_OTHER): Payer: PRIVATE HEALTH INSURANCE

## 2019-10-05 ENCOUNTER — Other Ambulatory Visit: Payer: Self-pay

## 2019-10-05 DIAGNOSIS — Z0000 Encounter for general adult medical examination without abnormal findings: Secondary | ICD-10-CM | POA: Diagnosis not present

## 2019-10-05 DIAGNOSIS — Z1231 Encounter for screening mammogram for malignant neoplasm of breast: Secondary | ICD-10-CM

## 2019-10-11 ENCOUNTER — Other Ambulatory Visit: Payer: Self-pay | Admitting: Nurse Practitioner

## 2019-10-11 ENCOUNTER — Other Ambulatory Visit (INDEPENDENT_AMBULATORY_CARE_PROVIDER_SITE_OTHER): Payer: PRIVATE HEALTH INSURANCE | Admitting: Nurse Practitioner

## 2019-10-11 DIAGNOSIS — M5432 Sciatica, left side: Secondary | ICD-10-CM

## 2019-10-11 DIAGNOSIS — M5431 Sciatica, right side: Secondary | ICD-10-CM

## 2019-10-11 MED ORDER — CYCLOBENZAPRINE HCL 10 MG PO TABS: 5.0000 mg | ORAL_TABLET | Freq: Three times a day (TID) | ORAL | 1 refills | Status: DC | PRN

## 2019-10-11 MED ORDER — PREDNISONE 20 MG PO TABS: 20.0000 mg | ORAL_TABLET | Freq: Two times a day (BID) | ORAL | 0 refills | Status: DC

## 2019-12-07 ENCOUNTER — Encounter: Payer: Self-pay | Admitting: Nurse Practitioner

## 2020-02-16 ENCOUNTER — Other Ambulatory Visit: Payer: Self-pay

## 2020-02-16 ENCOUNTER — Encounter: Payer: Self-pay | Admitting: Emergency Medicine

## 2020-02-16 ENCOUNTER — Emergency Department (INDEPENDENT_AMBULATORY_CARE_PROVIDER_SITE_OTHER)
Admission: EM | Admit: 2020-02-16 | Discharge: 2020-02-16 | Disposition: A | Discharge status: Home / Self Care | Payer: PRIVATE HEALTH INSURANCE | Source: Home / Self Care

## 2020-02-16 DIAGNOSIS — T148XXA Other injury of unspecified body region, initial encounter: Secondary | ICD-10-CM

## 2020-02-16 MED ORDER — MUPIROCIN 2 % EX OINT: TOPICAL_OINTMENT | CUTANEOUS | 0 refills | Status: DC

## 2020-02-16 NOTE — Discharge Instructions (Signed)
  Keep wound clean with warm water and soap. Pat dry, apply antibiotic ointment and bandage.  Follow up with family medicine if not improving in 1 week, sooner if worsening or new symptoms develop.

## 2020-02-16 NOTE — ED Triage Notes (Signed)
Pt noticed tick earlier today at 1545. Pt  Put petroleum jelly on it when she arrived home at 1730 Redness around area Pt's husband attempted to remove at home Pt was last outside on Sunday or Monday working on digging up a drain in the driveway  COVID vaccine Moderna May 2021 Pt had COVID in Jan 2021

## 2020-02-16 NOTE — ED Provider Notes (Signed)
[facility]    CSN: [id number] Arrival date & time: 02/16/20  1822      History   Chief Complaint Chief Complaint  Patient presents with  . Tick Removal    HPI Jessica Mullen is a 59 y.o. female.   HPI  Jessica Mullen is a 59 y.o. female presenting to UC with concern she has a tick embedded in her lower abdomen. She noticed a dark spot on her lower abdomen this afternoon that has since developed redness around the edges of the dark lesion.  No known injury. Pt states she is concerned it may be a tick. She was outside this past weekend helping her husband dig up a drain in the driveway.  Her husband applied vaseline on the spot at home, thinking it was a tick as well, hoping it would come out but no success.  Pt denies pain or itching.   Past Medical History:  Diagnosis Date  . Acute meniscal tear of left knee   . CMV (cytomegalovirus infection) (HCC)   . Endometriosis   . Environmental allergies   . IBS (irritable bowel syndrome)   . OA (osteoarthritis) of knee    BILATERAL KNEE  . TMJ (dislocation of temporomandibular joint)   . Wears glasses     Patient Active Problem List   Diagnosis Date Noted  . Pain of joint of left ankle and foot 08/11/2019  . Strain of peroneal tendon 07/21/2019  . Left temporal headache 07/03/2015  . Mitral valve prolapse syndrome 05/08/2012  . Endometriosis   . MVP (mitral valve prolapse)   . FATIGUE 11/07/2008    Past Surgical History:  Procedure Laterality Date  . APPENDECTOMY    . CESAREAN SECTION  1984  &  1987  . COMBINED LAPAROSCOPY W/ HYSTEROSCOPY  10-31-2005   W/ BX AND ALBATION ENDOMETRIOSIS  . CYSTO/  LEFT URETERAL STENT PLACEMENT  AS CHILD  . EXPLORATORY LAPAROTOMY/  LEFT SALPINGOOPHORECTOMY/  ABLATION ENDOMETRIOSIS  04-20-2002  . KNEE ARTHROSCOPY Left 10/21/2013   Procedure: LEFT ARTHROSCOPY KNEE WITH DEBRIDEMENT;  Surgeon: Shelda Pal, MD;  Location: [facility];  Service: Orthopedics;   Laterality: Left;  . TRANSTHORACIC ECHOCARDIOGRAM  05-09-2012   NORMAL STUDY/  EF 60-65%/  NO EVIDENCE MVP  . URETERAL REIMPLANTION Left AGE 69    OB History    Gravida  2   Para  2   Term      Preterm      AB  0   Living  2     SAB      TAB      Ectopic      Multiple      Live Births               Home Medications    Prior to Admission medications   Medication Sig Start Date End Date Taking? Authorizing Provider  albuterol (VENTOLIN HFA) 108 (90 Base) MCG/ACT inhaler Inhale 2 puffs into the lungs every 6 (six) hours as needed for wheezing or shortness of breath. 05/27/19   Rodolph Bong, MD  cyclobenzaprine (FLEXERIL) 10 MG tablet Take 0.5-1 tablets (5-10 mg total) by mouth 3 (three) times daily as needed for muscle spasms. Caution: can cause drowsiness 10/11/19   Early, Sung Amabile, NP  mupirocin ointment (BACTROBAN) 2 % Apply to wound 3 times daily for 5 days 02/16/20   Lurene Shadow, PA-C  predniSONE (DELTASONE) 20 MG tablet Take 1 tablet (  20 mg total) by mouth 2 (two) times daily with a meal. 10/11/19   Early, Sung Amabile, NP  valACYclovir (VALTREX) 500 MG tablet Take 1 tablet (500 mg total) by mouth 2 (two) times daily. 09/08/19   Agapito Games, MD    Family History Family History  Problem Relation Age of Onset  . Diabetes Father   . Cancer Father        lung, leukemia  . COPD Father   . Heart disease Father   . Hypertension Mother   . Thyroid disease Mother   . Depression Mother   . Cancer Maternal Grandfather        COLON  . Healthy Sister   . Diabetes Brother   . Healthy Sister     Social History Social History   Tobacco Use  . Smoking status: Never Smoker  . Smokeless tobacco: Never Used  Vaping Use  . Vaping Use: Never used  Substance Use Topics  . Alcohol use: Yes    Alcohol/week: 0.0 standard drinks    Comment: Rare  . Drug use: No     Allergies   Propoxyphene n-acetaminophen, Adhesive [tape], and Bee venom   Review of  Systems Review of Systems  Constitutional: Negative for chills and fever.  Skin: Positive for color change. Negative for wound.     Physical Exam Triage Vital Signs ED Triage Vitals  Enc Vitals Group     BP 02/16/20 1837 131/85     Pulse Rate 02/16/20 1837 62     Resp 02/16/20 1837 16     Temp 02/16/20 1837 98.2 F (36.8 C)     Temp Source 02/16/20 1837 Oral     SpO2 02/16/20 1837 98 %     Weight --      Height --      Head Circumference --      Peak Flow --      Pain Score 02/16/20 1838 0     Pain Loc --      Pain Edu? --      Excl. in GC? --    No data found.  Updated Vital Signs BP 131/85 (BP Location: Right Arm)   Pulse 62   Temp 98.2 F (36.8 C) (Oral)   Resp 16   SpO2 98%   Visual Acuity Right Eye Distance:   Left Eye Distance:   Bilateral Distance:    Right Eye Near:   Left Eye Near:    Bilateral Near:     Physical Exam Vitals and nursing note reviewed.  Constitutional:      Appearance: Normal appearance. She is well-developed.  HENT:     Head: Normocephalic and atraumatic.  Cardiovascular:     Rate and Rhythm: Normal rate.  Pulmonary:     Effort: Pulmonary effort is normal.  Musculoskeletal:        General: Normal range of motion.     Cervical back: Normal range of motion.  Skin:    General: Skin is warm and dry.       Neurological:     Mental Status: She is alert and oriented to person, place, and time.  Psychiatric:        Behavior: Behavior normal.      UC Treatments / Results  Labs (all labs ordered are listed, but only abnormal results are displayed) Labs Reviewed - No data to display  EKG   Radiology No results found.  Procedures Incision and Drainage  Date/Time: 02/17/2020 10:08  AM Performed by: Lurene Shadow, PA-C Authorized by: Lurene Shadow, PA-C   Consent:    Consent obtained:  Verbal   Consent given by:  Patient   Risks discussed:  Bleeding, incomplete drainage, pain and infection   Alternatives  discussed:  No treatment and delayed treatment Location:    Type:  Fluid collection   Size:  38mm   Location:  Trunk   Trunk location:  Abdomen Pre-procedure details:    Procedure prep: alcohol swab. Anesthesia (see MAR for exact dosages):    Anesthesia method:  None Procedure type:    Complexity:  Simple Procedure details:    Incision types:  Stab incision   Incision depth:  Subcutaneous (18gtt needle)   Drainage:  Bloody   Drainage amount:  Scant   Wound treatment:  Wound left open (bacitracin and bandage applied) Post-procedure details:    Patient tolerance of procedure:  Tolerated well, no immediate complications   (including critical care time)  Medications Ordered in UC Medications - No data to display  Initial Impression / Assessment and Plan / UC Course  I have reviewed the triage vital signs and the nursing notes.  Pertinent labs & imaging results that were available during my care of the patient were reviewed by me and considered in my medical decision making (see chart for details).     Exam c/w small blood blister No evidence of tick or other foreign body  Will tx now open wound with mupirocin  AVS given  Final Clinical Impressions(s) / UC Diagnoses   Final diagnoses:  Blood blister     Discharge Instructions      Keep wound clean with warm water and soap. Pat dry, apply antibiotic ointment and bandage.  Follow up with family medicine if not improving in 1 week, sooner if worsening or new symptoms develop.     ED Prescriptions    Medication Sig Dispense Auth. Provider   mupirocin ointment (BACTROBAN) 2 % Apply to wound 3 times daily for 5 days 22 g Lurene Shadow, [place]     PDMP not reviewed this encounter.   Lurene Shadow, [place] 02/17/20 1011

## 2020-02-17 DIAGNOSIS — S30821A Blister (nonthermal) of abdominal wall, initial encounter: Secondary | ICD-10-CM

## 2020-09-11 ENCOUNTER — Encounter: Payer: Self-pay | Admitting: Family Medicine

## 2020-09-11 DIAGNOSIS — B009 Herpesviral infection, unspecified: Secondary | ICD-10-CM

## 2020-09-12 MED ORDER — VALACYCLOVIR HCL 500 MG PO TABS: 500.0000 mg | ORAL_TABLET | Freq: Two times a day (BID) | ORAL | 3 refills | Status: DC

## 2020-09-24 ENCOUNTER — Other Ambulatory Visit: Payer: Self-pay

## 2020-09-24 ENCOUNTER — Emergency Department (HOSPITAL_COMMUNITY)
Admission: EM | Admit: 2020-09-24 | Discharge: 2020-09-24 | Disposition: A | Discharge status: Home / Self Care | Payer: No Typology Code available for payment source | Attending: Emergency Medicine | Admitting: Emergency Medicine

## 2020-09-24 ENCOUNTER — Encounter (HOSPITAL_COMMUNITY): Payer: Self-pay

## 2020-09-24 ENCOUNTER — Emergency Department (HOSPITAL_COMMUNITY): Payer: No Typology Code available for payment source

## 2020-09-24 DIAGNOSIS — R112 Nausea with vomiting, unspecified: Secondary | ICD-10-CM | POA: Diagnosis not present

## 2020-09-24 DIAGNOSIS — R1013 Epigastric pain: Secondary | ICD-10-CM | POA: Diagnosis not present

## 2020-09-24 LAB — URINALYSIS, ROUTINE W REFLEX MICROSCOPIC
Bacteria, UA: NONE SEEN
Bilirubin Urine: NEGATIVE
Glucose, UA: NEGATIVE mg/dL
Ketones, ur: NEGATIVE mg/dL
Leukocytes,Ua: NEGATIVE
Nitrite: NEGATIVE
Protein, ur: NEGATIVE mg/dL
Specific Gravity, Urine: 1.014 (ref 1.005–1.030)
pH: 5 (ref 5.0–8.0)

## 2020-09-24 LAB — CBC
HCT: 44.4 % (ref 36.0–46.0)
Hemoglobin: 14.6 g/dL (ref 12.0–15.0)
MCH: 30.1 pg (ref 26.0–34.0)
MCHC: 32.9 g/dL (ref 30.0–36.0)
MCV: 91.5 fL (ref 80.0–100.0)
Platelets: 271 10*3/uL (ref 150–400)
RBC: 4.85 MIL/uL (ref 3.87–5.11)
RDW: 12.8 % (ref 11.5–15.5)
WBC: 15.1 10*3/uL — ABNORMAL HIGH (ref 4.0–10.5)
nRBC: 0 % (ref 0.0–0.2)

## 2020-09-24 LAB — COMPREHENSIVE METABOLIC PANEL
ALT: 17 U/L (ref 0–44)
AST: 14 U/L — ABNORMAL LOW (ref 15–41)
Albumin: 4.5 g/dL (ref 3.5–5.0)
Alkaline Phosphatase: 75 U/L (ref 38–126)
Anion gap: 13 (ref 5–15)
BUN: 21 mg/dL — ABNORMAL HIGH (ref 6–20)
CO2: 29 mmol/L (ref 22–32)
Calcium: 9.8 mg/dL (ref 8.9–10.3)
Chloride: 99 mmol/L (ref 98–111)
Creatinine, Ser: 0.88 mg/dL (ref 0.44–1.00)
GFR, Estimated: >60 mL/min (ref >60)
Glucose, Bld: 124 mg/dL — ABNORMAL HIGH (ref 70–99)
Potassium: 3.8 mmol/L (ref 3.5–5.1)
Sodium: 141 mmol/L (ref 135–145)
Total Bilirubin: 0.7 mg/dL (ref 0.3–1.2)
Total Protein: 7.8 g/dL (ref 6.5–8.1)

## 2020-09-24 LAB — LIPASE, BLOOD: Lipase: 27 U/L (ref 11–51)

## 2020-09-24 LAB — I-STAT BETA HCG BLOOD, ED (MC, WL, AP ONLY): I-stat hCG, quantitative: 7.2 m[IU]/mL — ABNORMAL HIGH (ref <5)

## 2020-09-24 MED ORDER — PANTOPRAZOLE SODIUM 40 MG PO TBEC: 40.0000 mg | DELAYED_RELEASE_TABLET | Freq: Every day | ORAL | 0 refills | Status: DC

## 2020-09-24 MED ORDER — SUCRALFATE 1 G PO TABS: 1.0000 g | ORAL_TABLET | Freq: Three times a day (TID) | ORAL | 0 refills | Status: DC

## 2020-09-24 MED ORDER — IOHEXOL 300 MG/ML  SOLN
100.0000 mL | Freq: Once | INTRAMUSCULAR | Status: AC | PRN
  Administered 2020-09-24: 100 mL via INTRAVENOUS

## 2020-09-24 MED ORDER — SODIUM CHLORIDE 0.9 % IV BOLUS
1000.0000 mL | Freq: Once | INTRAVENOUS | Status: AC
  Administered 2020-09-24: 1000 mL via INTRAVENOUS

## 2020-09-24 MED ORDER — ONDANSETRON HCL 4 MG/2ML IJ SOLN
4.0000 mg | Freq: Once | INTRAMUSCULAR | Status: AC
  Administered 2020-09-24: 4 mg via INTRAVENOUS
  Filled 2020-09-24: qty 2

## 2020-09-24 MED ORDER — ONDANSETRON 4 MG PO TBDP
4.0000 mg | ORAL_TABLET | Freq: Once | ORAL | Status: AC | PRN
  Administered 2020-09-24: 4 mg via ORAL
  Filled 2020-09-24: qty 1

## 2020-09-24 NOTE — Discharge Instructions (Signed)
I suspect that you might have a small ulcer.  Take Protonix and Carafate as prescribed.  Stop taking prednisone.  Please avoid alcohol, ibuprofen.  Follow-up with your GI doctor or you may follow-up with Dr. Dulce Sellar if unable to follow-up with your normal GI doctor.  Please return if you notice any black/tarry stools as discussed or any bloody stools.

## 2020-09-24 NOTE — ED Provider Notes (Signed)
[facility]-EMERGENCY DEPT Provider Note   CSN: [id number] Arrival date & time: 09/24/20  1346     History Chief Complaint  Patient presents with  . Abdominal Pain  . Nausea    Jessica Mullen is a 60 y.o. female.  The history is provided by the patient.  Abdominal Pain Pain location:  Epigastric Pain quality: aching   Pain radiates to:  Does not radiate Pain severity:  Mild Onset quality:  Gradual Timing:  Constant Progression:  Improving Chronicity:  New Context comment:  Unsure of what is trigger pain, has been on steroids recently of sciatica. Hx of IBS. Pain in epigastric region with n/v Relieved by:  Nothing Worsened by:  Nothing Associated symptoms: nausea and vomiting   Associated symptoms: no chest pain, no chills, no cough, no dysuria, no fever, no hematuria, no shortness of breath and no sore throat   Risk factors: multiple surgeries        Past Medical History:  Diagnosis Date  . Acute meniscal tear of left knee   . CMV (cytomegalovirus infection) (HCC)   . Endometriosis   . Environmental allergies   . IBS (irritable bowel syndrome)   . OA (osteoarthritis) of knee    BILATERAL KNEE  . TMJ (dislocation of temporomandibular joint)   . Wears glasses     Patient Active Problem List   Diagnosis Date Noted  . Pain of joint of left ankle and foot 08/11/2019  . Strain of peroneal tendon 07/21/2019  . Left temporal headache 07/03/2015  . Mitral valve prolapse syndrome 05/08/2012  . Endometriosis   . MVP (mitral valve prolapse)   . FATIGUE 11/07/2008    Past Surgical History:  Procedure Laterality Date  . APPENDECTOMY    . CESAREAN SECTION  1984  &  1987  . COMBINED LAPAROSCOPY W/ HYSTEROSCOPY  10-31-2005   W/ BX AND ALBATION ENDOMETRIOSIS  . CYSTO/  LEFT URETERAL STENT PLACEMENT  AS CHILD  . EXPLORATORY LAPAROTOMY/  LEFT SALPINGOOPHORECTOMY/  ABLATION ENDOMETRIOSIS  04-20-2002  . KNEE ARTHROSCOPY Left 10/21/2013   Procedure:  LEFT ARTHROSCOPY KNEE WITH DEBRIDEMENT;  Surgeon: Shelda Pal, MD;  Location: [facility];  Service: Orthopedics;  Laterality: Left;  . TRANSTHORACIC ECHOCARDIOGRAM  05-09-2012   NORMAL STUDY/  EF 60-65%/  NO EVIDENCE MVP  . URETERAL REIMPLANTION Left AGE 43     OB History    Gravida  2   Para  2   Term      Preterm      AB  0   Living  2     SAB      IAB      Ectopic      Multiple      Live Births              Family History  Problem Relation Age of Onset  . Diabetes Father   . Cancer Father        lung, leukemia  . COPD Father   . Heart disease Father   . Hypertension Mother   . Thyroid disease Mother   . Depression Mother   . Cancer Maternal Grandfather        COLON  . Healthy Sister   . Diabetes Brother   . Healthy Sister     Social History   Tobacco Use  . Smoking status: Never Smoker  . Smokeless tobacco: Never Used  Vaping Use  . Vaping Use: Never used  Substance Use Topics  . Alcohol use: Yes    Alcohol/week: 0.0 standard drinks    Comment: Rare  . Drug use: No    Home Medications Prior to Admission medications   Medication Sig Start Date End Date Taking? Authorizing Provider  pantoprazole (PROTONIX) 40 MG tablet Take 1 tablet (40 mg total) by mouth daily for 14 days. 09/24/20 10/08/20 Yes Xzander Gilham, DO  sucralfate (CARAFATE) 1 g tablet Take 1 tablet (1 g total) by mouth 4 (four) times daily -  with meals and at bedtime for 14 days. 09/24/20 10/08/20 Yes Elasha Tess, DO  albuterol (VENTOLIN HFA) 108 (90 Base) MCG/ACT inhaler Inhale 2 puffs into the lungs every 6 (six) hours as needed for wheezing or shortness of breath. 05/27/19   Rodolph Bong, MD  cyclobenzaprine (FLEXERIL) 10 MG tablet Take 0.5-1 tablets (5-10 mg total) by mouth 3 (three) times daily as needed for muscle spasms. Caution: can cause drowsiness 10/11/19   Early, Sung Amabile, NP  mupirocin ointment (BACTROBAN) 2 % Apply to wound 3 times daily for 5 days  02/16/20   Lurene Shadow, PA-C  predniSONE (DELTASONE) 20 MG tablet Take 1 tablet (20 mg total) by mouth 2 (two) times daily with a meal. 10/11/19   Early, Sung Amabile, NP  valACYclovir (VALTREX) 500 MG tablet Take 1 tablet (500 mg total) by mouth 2 (two) times daily. 09/12/20   Agapito Games, MD    Allergies    Propoxyphene n-acetaminophen, Adhesive [tape], and Bee venom  Review of Systems   Review of Systems  Constitutional: Negative for chills and fever.  HENT: Negative for ear pain and sore throat.   Eyes: Negative for pain and visual disturbance.  Respiratory: Negative for cough and shortness of breath.   Cardiovascular: Negative for chest pain and palpitations.  Gastrointestinal: Positive for abdominal pain, nausea and vomiting.  Genitourinary: Negative for dysuria and hematuria.  Musculoskeletal: Negative for arthralgias and back pain.  Skin: Negative for color change and rash.  Neurological: Negative for seizures and syncope.  All other systems reviewed and are negative.   Physical Exam Updated Vital Signs BP 103/60   Pulse 83   Temp (!) 97.4 F (36.3 C) (Axillary)   Resp (!) 21   SpO2 98%   Physical Exam Vitals and nursing note reviewed.  Constitutional:      General: She is not in acute distress.    Appearance: She is well-developed and well-nourished. She is not ill-appearing.  HENT:     Head: Normocephalic and atraumatic.     Mouth/Throat:     Mouth: Mucous membranes are moist.  Eyes:     Extraocular Movements: Extraocular movements intact.     Conjunctiva/sclera: Conjunctivae normal.  Cardiovascular:     Rate and Rhythm: Normal rate and regular rhythm.     Heart sounds: Normal heart sounds. No murmur heard.   Pulmonary:     Effort: Pulmonary effort is normal. No respiratory distress.     Breath sounds: Normal breath sounds.  Abdominal:     Palpations: Abdomen is soft.     Tenderness: There is abdominal tenderness in the right upper quadrant,  epigastric area and left upper quadrant.     Hernia: No hernia is present.  Musculoskeletal:        General: No edema.     Cervical back: Neck supple.  Skin:    General: Skin is warm and dry.     Capillary Refill: Capillary refill takes less  than 2 seconds.  Neurological:     General: No focal deficit present.     Mental Status: She is alert.  Psychiatric:        Mood and Affect: Mood and affect normal.     ED Results / Procedures / Treatments   Labs (all labs ordered are listed, but only abnormal results are displayed) Labs Reviewed  COMPREHENSIVE METABOLIC PANEL - Abnormal; Notable for the following components:      Result Value   Glucose, Bld 124 (*)    BUN 21 (*)    AST 14 (*)    All other components within normal limits  CBC - Abnormal; Notable for the following components:   WBC 15.1 (*)    All other components within normal limits  URINALYSIS, ROUTINE W REFLEX MICROSCOPIC - Abnormal; Notable for the following components:   Hgb urine dipstick SMALL (*)    All other components within normal limits  LIPASE, BLOOD  I-STAT BETA HCG BLOOD, ED ([facility], [facility], [facility] ONLY)    EKG EKG Interpretation  Date/Time:  Monday September 24 2020 17:23:47 EST Ventricular Rate:  66 PR Interval:    QRS Duration: 97 QT Interval:  418 QTC Calculation: 438 R Axis:   13 Text Interpretation: Sinus rhythm Confirmed by Virgina Norfolk [phone]) on 09/24/2020 5:26:53 PM   Radiology CT ABDOMEN PELVIS W CONTRAST  Result Date: 09/24/2020 CLINICAL DATA:  60 year old female with epigastric pain. EXAM: CT ABDOMEN AND PELVIS WITH CONTRAST TECHNIQUE: Multidetector CT imaging of the abdomen and pelvis was performed using the standard protocol following bolus administration of intravenous contrast. CONTRAST:  OMNIPAQUE IOHEXOL 300 MG/ML  SOLN COMPARISON:  CT abdomen pelvis dated 12/29/2015. FINDINGS: Lower chest: The visualized lung bases are clear. No intra-abdominal free air or free fluid. Hepatobiliary:  No focal liver abnormality is seen. No gallstones, gallbladder wall thickening, or biliary dilatation. Pancreas: Unremarkable. No pancreatic ductal dilatation or surrounding inflammatory changes. Spleen: Normal in size without focal abnormality. Adrenals/Urinary Tract: The adrenal glands unremarkable. There is no hydronephrosis on either side. There is symmetric enhancement and excretion of contrast by both kidneys. Mild left renal parenchyma atrophy and cortical scarring. The visualized ureters and urinary bladder appear unremarkable. Stomach/Bowel: Faint high attenuating focus in the distal stomach (coronal 39/5, sagittal 92/6, and axial 23/2) may be artifactual. A small ulcer with bleeding is not excluded clinical correlation is recommended. There is no bowel obstruction or active inflammation. The appendix is normal. Vascular/Lymphatic: The abdominal aorta and IVC unremarkable. No portal venous gas. There is no adenopathy. Reproductive: The uterus is anteverted and grossly unremarkable. No adnexal masses. Other: None Musculoskeletal: No acute or significant osseous findings. IMPRESSION: 1. Artifact versus possible small distal gastric ulcer. Clinical correlation is recommended. Endoscopy may provide better evaluation. 2. No bowel obstruction. Normal appendix. Electronically Signed   By: Elgie Collard M.D.   On: 09/24/2020 18:46    Procedures Procedures   Medications Ordered in ED Medications  ondansetron (ZOFRAN-ODT) disintegrating tablet 4 mg (4 mg Oral Given 09/24/20 1441)  sodium chloride 0.9 % bolus 1,000 mL (0 mLs Intravenous Stopped 09/24/20 1939)  ondansetron (ZOFRAN) injection 4 mg (4 mg Intravenous Given 09/24/20 1800)  iohexol (OMNIPAQUE) 300 MG/ML solution 100 mL (100 mLs Intravenous Contrast Given 09/24/20 1817)    ED Course  I have reviewed the triage vital signs and the nursing notes.  Pertinent labs & imaging results that were available during my care of the patient were reviewed by me  and  considered in my medical decision making (see chart for details).    MDM Rules/Calculators/A&P                          AZAYLA POLO is a 60 year old female with history of IBS who presents the ED with abdominal pain, nausea, vomiting.  Normal vitals.  No fever.  Symptoms started today.  Has been on steroids for the last 2 weeks for sciatica and concern for side effect of medication.  Has not had any melena or hematochezia.  Feeling better after Zofran in the waiting room.  Tender in the upper abdomen.  No rebound.  Concern for pancreatitis versus gastritis versus reflux versus viral GI process versus colitis.  Lower suspicion for bowel obstruction.  She has had multiple abdominal surgeries however.  We will get basic labs including CT scan abdomen pelvis.  Will give IV fluid bolus and IV Zofran.  At this time no concern for GI bleed.  Mild leukocytosis of 15.  However no significant anemia, electrolyte abnormality, kidney injury.  Lipase is normal.  Lower suspicion for pancreatitis.  Gallbladder liver enzymes within normal limits and lower suspicion for cholecystitis.  Patient awaiting CT scan abdomen and pelvis.  CT scan shows may be a small distal gastric ulcer.  There is no free air.  Talked with Dr. Dulce Sellar with GI and will start her on a PPI and Carafate.  She has a GI doctor that she follows with but will follow up with Dr. Dulce Sellar if she cannot get in earlier appointment.  She may need EGD.  She understands return if she develops any dark stools or bloody stools.  Discharged in good condition.  This chart was dictated using voice recognition software.  Despite best efforts to proofread,  errors can occur which can change the documentation meaning.   Final Clinical Impression(s) / ED Diagnoses Final diagnoses:  Epigastric pain    Rx / DC Orders ED Discharge Orders         Ordered    pantoprazole (PROTONIX) 40 MG tablet  Daily        09/24/20 2020    sucralfate (CARAFATE) 1 g  tablet  3 times daily with meals & bedtime        09/24/20 2020           Virgina Norfolk, DO 09/24/20 2021

## 2020-09-24 NOTE — ED Notes (Signed)
Attempted IV insertion x2; [place] contacted to attempt ultrasound IV

## 2020-09-24 NOTE — ED Triage Notes (Signed)
Pt reports she has been taking prednisone x13 days for sciatica. Pt now endorses intermittent severe abdominal pain and nausea. Pt is concerned she has developed an ulcer. Pt denies V/D and rectal bleeding.

## 2020-09-25 ENCOUNTER — Telehealth: Payer: Self-pay | Admitting: *Deleted

## 2020-09-25 NOTE — Telephone Encounter (Signed)
Transition Care Management Follow-up Telephone Call  Date of discharge and from where: 09/24/2020 - [facility]  How have you been since you were released from the hospital? "I am still throwing up and I have a headache" (pt has not picked up the prescriptions given to her last night)  Any questions or concerns? No  Items Reviewed:  Did the pt receive and understand the discharge instructions provided? Yes   Medications obtained and verified? Yes   Other? N/A  Any new allergies since your discharge? No   Dietary orders reviewed? Yes  Do you have support at home? Yes   Home Care and Equipment/Supplies: Were home health services ordered? not applicable If so, what is the name of the agency? N/A  Has the agency set up a time to come to the patient's home? not applicable Were any new equipment or medical supplies ordered?  No What is the name of the medical supply agency? N/A Were you able to get the supplies/equipment? not applicable Do you have any questions related to the use of the equipment or supplies? No  Functional Questionnaire: (I = Independent and D = Dependent) ADLs: I  Bathing/Dressing- I  Meal Prep- I  Eating- I  Maintaining continence- I  Transferring/Ambulation- I  Managing Meds- I  Follow up appointments reviewed:   PCP Hospital f/u appt confirmed? Yes  Scheduled to see Dr. Linford Arnold on 10/01/2020 @ 0810.  Specialist Hospital f/u appt confirmed? N/A   Are transportation arrangements needed? No   If their condition worsens, is the pt aware to call PCP or go to the Emergency Dept.? Yes  Was the patient provided with contact information for the PCP's office or ED? Yes  Was to pt encouraged to call back with questions or concerns? Yes

## 2020-09-26 ENCOUNTER — Telehealth (INDEPENDENT_AMBULATORY_CARE_PROVIDER_SITE_OTHER): Payer: PRIVATE HEALTH INSURANCE | Admitting: Medical-Surgical

## 2020-09-26 DIAGNOSIS — K253 Acute gastric ulcer without hemorrhage or perforation: Secondary | ICD-10-CM | POA: Diagnosis not present

## 2020-09-26 MED ORDER — ONDANSETRON 8 MG PO TBDP: 8.0000 mg | ORAL_TABLET | Freq: Three times a day (TID) | ORAL | 0 refills | Status: DC | PRN

## 2020-09-26 NOTE — Progress Notes (Signed)
Virtual Visit via Telephone   I connected with  Jessica Mullen  on 09/26/20 by telephone/telehealth and verified that I am speaking with the correct person using two identifiers.   I discussed the limitations, risks, security and privacy concerns of performing an evaluation and management service by telephone, including the higher likelihood of inaccurate diagnosis and treatment, and the availability of in person appointments.  We also discussed the likely need of an additional face to face encounter for complete and high quality delivery of care.  I also discussed with the patient that there may be a patient responsible charge related to this service. The patient expressed understanding and wishes to proceed.  Provider location is in medical facility. Patient location is at their home, different from provider location. People involved in care of the patient during this telehealth encounter were myself, my nurse/medical assistant, and my front office/scheduling team member.  CC: nausea/vomiting, headache  HPI: Pleasant 60 year old female presenting via telephone due to difficulty getting into MyChart. She was seen on Monday at the ED for epigastric pain, nausea/vomiting, and headache. She was treated with IV fluids for dehydration then discharged home on a PPI and Sucralfate. She did not pick these up until yesterday but has since had trouble keeping them down due to severe nausea. Reports she was under the impression that they would prescribe her Zofran for nausea but they did not. She has been very tired and notes sleeping most of the times since she was discharged from the ED. Has been able to keep down about 6 ounces of fluids today. Vomited once last night and twice today. Emesis is bile without blood. Endorses mild dizziness if rising quickly. Has had a constant headache, no OTC treatments attempted due to fear of being unable to keep it down. Has lost 6 lbs since Monday. Denies fever, chills,  palpitations, diarrhea, hematochezia, and melena. Does not have a blood pressure cuff at home to monitor for hypotension. Is supposed to contact her GI provider but has not done so yet. Was instructed to follow up with Dr. Dulce Sellar if she could not see her provider.   Review of Systems: See HPI for pertinent positives and negatives.   Objective Findings:    General: Speaking full sentences, no audible heavy breathing.  Sounds alert and appropriately interactive.    Impression and Recommendations:    1. Acute gastric ulcer without hemorrhage or perforation Sending in Zofran ODT 8mg  every 8 hours as needed for nausea and vomiting. Recommend continuing the PPI and Sucralfate as prescribed. Once nausea is better controlled, start with clear liquids and progress slowly as tolerated. Suspect her headache is related to dehydration at this point and the nausea is likely worsened from it as well. Recommend prn Tylenol for her headache while she is trying to get rehydrated. If she develops worsening headache, rapid heart rate, hematemesis, hematochezia, melena, or worsening dizziness, advised a return to the ED would be warranted for evaluation and likely IV fluids. Patient verbalized understanding. If she is unable to get in touch with her GI provider or Dr. , advised to follow up with her PCP regarding her ulcer and treatment.   I discussed the above assessment and treatment plan with the patient. The patient was provided an opportunity to ask questions and all were answered. The patient agreed with the plan and demonstrated an understanding of the instructions.   The patient was advised to call back or seek an in-person evaluation if the symptoms worsen  or if the condition fails to improve as anticipated.  20 minutes of non-face-to-face time was provided during this encounter.  Return if symptoms worsen or fail to improve. ___________________________________________ Christen Butter, DNP, APRN,  FNP-BC Primary Care and Sports Medicine [facility] [place]

## 2020-10-01 ENCOUNTER — Encounter: Payer: PRIVATE HEALTH INSURANCE | Admitting: Family Medicine

## 2020-12-19 ENCOUNTER — Other Ambulatory Visit: Payer: Self-pay | Admitting: Family Medicine

## 2020-12-19 ENCOUNTER — Ambulatory Visit (INDEPENDENT_AMBULATORY_CARE_PROVIDER_SITE_OTHER): Payer: PRIVATE HEALTH INSURANCE

## 2020-12-19 ENCOUNTER — Other Ambulatory Visit: Payer: Self-pay

## 2020-12-19 DIAGNOSIS — Z1231 Encounter for screening mammogram for malignant neoplasm of breast: Secondary | ICD-10-CM

## 2021-08-13 ENCOUNTER — Emergency Department (INDEPENDENT_AMBULATORY_CARE_PROVIDER_SITE_OTHER)
Admission: EM | Admit: 2021-08-13 | Discharge: 2021-08-13 | Disposition: A | Discharge status: Home / Self Care | Payer: No Typology Code available for payment source | Source: Home / Self Care | Attending: Family Medicine | Admitting: Family Medicine

## 2021-08-13 ENCOUNTER — Other Ambulatory Visit: Payer: Self-pay

## 2021-08-13 DIAGNOSIS — R6889 Other general symptoms and signs: Secondary | ICD-10-CM

## 2021-08-13 DIAGNOSIS — R52 Pain, unspecified: Secondary | ICD-10-CM

## 2021-08-13 DIAGNOSIS — R059 Cough, unspecified: Secondary | ICD-10-CM | POA: Diagnosis not present

## 2021-08-13 DIAGNOSIS — J029 Acute pharyngitis, unspecified: Secondary | ICD-10-CM

## 2021-08-13 LAB — POC INFLUENZA A AND B ANTIGEN (URGENT CARE ONLY)
Influenza A Ag: NEGATIVE
Influenza B Ag: NEGATIVE

## 2021-08-13 LAB — POC SARS CORONAVIRUS 2 AG -  ED: SARS Coronavirus 2 Ag: NEGATIVE

## 2021-08-13 MED ORDER — OSELTAMIVIR PHOSPHATE 75 MG PO CAPS: 75.0000 mg | ORAL_CAPSULE | Freq: Two times a day (BID) | ORAL | 0 refills | Status: DC

## 2021-08-13 MED ORDER — BENZONATATE 200 MG PO CAPS: 200.0000 mg | ORAL_CAPSULE | Freq: Three times a day (TID) | ORAL | 0 refills | Status: AC | PRN

## 2021-08-13 MED ORDER — AZITHROMYCIN 250 MG PO TABS: 250.0000 mg | ORAL_TABLET | Freq: Every day | ORAL | 0 refills | Status: DC

## 2021-08-13 NOTE — ED Provider Notes (Signed)
[facility]    CSN: [id number] Arrival date & time: 08/13/21  0820      History   Chief Complaint Chief Complaint  Patient presents with   Fever   Sore Throat   Cough   Generalized Body Aches    HPI Jessica Mullen is a 60 y.o. female.   HPI 60 year old female patient presents with fever, body aches, sore throat and cough that began last night.  Past Medical History:  Diagnosis Date   Acute meniscal tear of left knee    CMV (cytomegalovirus infection) ([place])    Endometriosis    Environmental allergies    IBS (irritable bowel syndrome)    OA (osteoarthritis) of knee    BILATERAL KNEE   TMJ (dislocation of temporomandibular joint)    Wears glasses     Patient Active Problem List   Diagnosis Date Noted   Pain of joint of left ankle and foot 08/11/2019   Strain of peroneal tendon 07/21/2019   Left temporal headache 07/03/2015   Mitral valve prolapse syndrome 05/08/2012   Endometriosis    MVP (mitral valve prolapse)    FATIGUE 11/07/2008    Past Surgical History:  Procedure Laterality Date   APPENDECTOMY     CESAREAN [place] W/ HYSTEROSCOPY  10-31-2005   W/ BX AND ALBATION ENDOMETRIOSIS   CYSTO/  LEFT URETERAL STENT PLACEMENT  AS CHILD   EXPLORATORY LAPAROTOMY/  LEFT SALPINGOOPHORECTOMY/  ABLATION ENDOMETRIOSIS  04-20-2002   KNEE ARTHROSCOPY Left 10/21/2013   Procedure: LEFT ARTHROSCOPY KNEE WITH DEBRIDEMENT;  Surgeon: Mauri Pole, MD;  Location: [facility];  Service: Orthopedics;  Laterality: Left;   TRANSTHORACIC ECHOCARDIOGRAM  05-09-2012   NORMAL STUDY/  EF 60-65%/  NO EVIDENCE MVP   URETERAL REIMPLANTION Left AGE 15    OB History     Gravida  2   Para  2   Term      Preterm      AB  0   Living  2      SAB      IAB      Ectopic      Multiple      Live Births               Home Medications    Prior to Admission medications   Medication Sig Start Date End  Date Taking? Authorizing Provider  azithromycin (ZITHROMAX) 250 MG tablet Take 1 tablet (250 mg total) by mouth daily. Take first 2 tablets together, then 1 every day until finished. 08/13/21  Yes Eliezer Lofts, FNP  benzonatate (TESSALON) 200 MG capsule Take 1 capsule (200 mg total) by mouth 3 (three) times daily as needed for up to 7 days for cough. 08/13/21 08/20/21 Yes Eliezer Lofts, FNP  oseltamivir (TAMIFLU) 75 MG capsule Take 1 capsule (75 mg total) by mouth every 12 (twelve) hours. 08/13/21  Yes Eliezer Lofts, FNP  pantoprazole (PROTONIX) 40 MG tablet Take 1 tablet (40 mg total) by mouth daily for 14 days. Patient not taking: Reported on 08/13/2021 09/24/20 10/08/20  Lennice Sites, DO  sucralfate (CARAFATE) 1 g tablet Take 1 tablet (1 g total) by mouth 4 (four) times daily -  with meals and at bedtime for 14 days. Patient not taking: Reported on 08/13/2021 09/24/20 10/08/20  Lennice Sites, DO  valACYclovir (VALTREX) 500 MG tablet Take 1 tablet (500 mg total) by mouth 2 (two) times daily. Patient  not taking: Reported on 08/13/2021 09/12/20   Agapito Games, MD    Family History Family History  Problem Relation Age of Onset   Diabetes Father    Cancer Father        lung, leukemia   COPD Father    Heart disease Father    Hypertension Mother    Thyroid disease Mother    Depression Mother    Cancer Maternal Grandfather        COLON   Healthy Sister    Diabetes Brother    Healthy Sister     Social History Social History   Tobacco Use   Smoking status: Never   Smokeless tobacco: Never  Vaping Use   Vaping Use: Never used  Substance Use Topics   Alcohol use: Yes    Alcohol/week: 0.0 standard drinks    Comment: Rare   Drug use: No     Allergies   Propoxyphene n-acetaminophen, Adhesive [tape], and Bee venom   Review of Systems Review of Systems  Constitutional:  Positive for fever.  HENT:  Positive for sore throat.   Respiratory:  Positive for cough.    Musculoskeletal:  Positive for myalgias.  All other systems reviewed and are negative.   Physical Exam Triage Vital Signs ED Triage Vitals  Enc Vitals Group     BP 08/13/21 0829 121/88     Pulse Rate 08/13/21 0829 (!) 127     Resp 08/13/21 0829 14     Temp 08/13/21 0829 99 F (37.2 C)     Temp Source 08/13/21 0829 Oral     SpO2 08/13/21 0829 97 %     Weight --      Height --      Head Circumference --      Peak Flow --      Pain Score 08/13/21 0830 0     Pain Loc --      Pain Edu? --      Excl. in [place]? --    No data found.  Updated Vital Signs BP 121/88 (BP Location: Left Arm)    Pulse (!) 127    Temp 99 F (37.2 C) (Oral)    Resp 14    SpO2 97%       Physical Exam Vitals and nursing note reviewed.  Constitutional:      General: She is not in acute distress.    Appearance: Normal appearance. She is obese. She is not ill-appearing.  HENT:     Head: Normocephalic and atraumatic.     Right Ear: Tympanic membrane, ear canal and external ear normal.     Left Ear: Tympanic membrane, ear canal and external ear normal.     Mouth/Throat:     Mouth: Mucous membranes are moist.     Pharynx: Oropharynx is clear. Uvula midline. Posterior oropharyngeal erythema and uvula swelling present.  Eyes:     Extraocular Movements: Extraocular movements intact.     Conjunctiva/sclera: Conjunctivae normal.     Pupils: Pupils are equal, round, and reactive to light.  Cardiovascular:     Rate and Rhythm: Normal rate and regular rhythm.     Pulses: Normal pulses.     Heart sounds: Normal heart sounds.  Pulmonary:     Effort: Pulmonary effort is normal.     Breath sounds: Normal breath sounds.  Musculoskeletal:     Cervical back: Tenderness present.  Lymphadenopathy:     Cervical: Cervical adenopathy present.  Skin:  General: Skin is warm and dry.  Neurological:     General: No focal deficit present.     Mental Status: She is alert and oriented to person, place, and time. Mental  status is at baseline.     UC Treatments / Results  Labs (all labs ordered are listed, but only abnormal results are displayed) Labs Reviewed  POC SARS CORONAVIRUS 2 AG -  ED  POC INFLUENZA A AND B ANTIGEN (URGENT CARE ONLY)    EKG   Radiology No results found.  Procedures Procedures (including critical care time)  Medications Ordered in UC Medications - No data to display  Initial Impression / Assessment and Plan / UC Course  I have reviewed the triage vital signs and the nursing notes.  Pertinent labs & imaging results that were available during my care of the patient were reviewed by me and considered in my medical decision making (see chart for details).     MDM: 1.  Generalized body aches-advised patient COVID-19 as well as influenza A/B were negative; 2.  Flu like symptoms-we will treat empirically with Tamiflu; 3.  Sore throat-Rx'd Zithromax; 4.  Cough-Rx'd [employer]  Patient discharged home, hemodynamically stable. Final Clinical Impressions(s) / UC Diagnoses   Final diagnoses:  Generalized body aches  Flu-like symptoms  Sore throat  Cough, unspecified type     Discharge Instructions      Patient is to take medication as directed with food to completion.  Advised patient may take Tessalon Perles daily or as needed for cough.  Encouraged patient increase daily water intake while taking these medications.     ED Prescriptions     Medication Sig Dispense Auth. Provider   oseltamivir (TAMIFLU) 75 MG capsule Take 1 capsule (75 mg total) by mouth every 12 (twelve) hours. 10 capsule Eliezer Lofts, FNP   azithromycin (ZITHROMAX) 250 MG tablet Take 1 tablet (250 mg total) by mouth daily. Take first 2 tablets together, then 1 every day until finished. 6 tablet Eliezer Lofts, FNP   benzonatate (TESSALON) 200 MG capsule Take 1 capsule (200 mg total) by mouth 3 (three) times daily as needed for up to 7 days for cough. 40 capsule Eliezer Lofts, FNP       PDMP not reviewed this encounter.   Eliezer Lofts, [place] 08/13/21 [phone]

## 2021-08-13 NOTE — ED Triage Notes (Signed)
Pt presents with fever, body aches, sore throat and cough that began lastnight

## 2021-08-13 NOTE — Discharge Instructions (Addendum)
Patient is to take medication as directed with food to completion.  Advised patient may take Tessalon Perles daily or as needed for cough.  Encouraged patient increase daily water intake while taking these medications.

## 2021-08-15 ENCOUNTER — Encounter: Payer: Self-pay | Admitting: Family Medicine

## 2021-08-15 NOTE — Telephone Encounter (Signed)
I agree it looks like they just did a rapid but not a PCR.  I think she has 2 choices.  She could either do another rapid at home just to see if it is positive at this point.  Some people do test negative initially and then convert to positive after 3 or 4 days of illness.  Or we are more than happy to have her swing by and do a PCR today before the weekend.  What ever would be more convenient or what ever might be required by her work especially if she is missing work.

## 2021-08-16 ENCOUNTER — Ambulatory Visit (INDEPENDENT_AMBULATORY_CARE_PROVIDER_SITE_OTHER): Payer: No Typology Code available for payment source | Admitting: Physician Assistant

## 2021-08-16 DIAGNOSIS — Z20822 Contact with and (suspected) exposure to covid-19: Secondary | ICD-10-CM | POA: Diagnosis not present

## 2021-08-16 NOTE — Progress Notes (Signed)
Pt here for COVID-19 PCR test swab per Dr. Shelah Lewandowsky note.  Tiajuana Amass, CMA

## 2021-08-17 LAB — NOVEL CORONAVIRUS, NAA: SARS-CoV-2, NAA: DETECTED — AB

## 2021-08-17 LAB — SARS-COV-2, NAA 2 DAY TAT

## 2021-08-19 NOTE — Progress Notes (Signed)
Positive for COVID

## 2021-08-20 NOTE — Progress Notes (Signed)
Agree with above plan. 

## 2021-11-08 ENCOUNTER — Encounter: Payer: Self-pay | Admitting: Family Medicine

## 2021-11-08 DIAGNOSIS — B009 Herpesviral infection, unspecified: Secondary | ICD-10-CM

## 2021-11-08 MED ORDER — VALACYCLOVIR HCL 1 G PO TABS: 1000.0000 mg | ORAL_TABLET | Freq: Every day | ORAL | 1 refills | Status: DC

## 2021-11-15 ENCOUNTER — Encounter: Payer: Self-pay | Admitting: Family Medicine

## 2022-01-27 ENCOUNTER — Ambulatory Visit: Payer: No Typology Code available for payment source | Admitting: Family Medicine

## 2022-03-11 ENCOUNTER — Other Ambulatory Visit (HOSPITAL_BASED_OUTPATIENT_CLINIC_OR_DEPARTMENT_OTHER): Payer: Self-pay | Admitting: Family Medicine

## 2022-03-11 DIAGNOSIS — Z1231 Encounter for screening mammogram for malignant neoplasm of breast: Secondary | ICD-10-CM

## 2022-03-12 ENCOUNTER — Ambulatory Visit (INDEPENDENT_AMBULATORY_CARE_PROVIDER_SITE_OTHER): Payer: No Typology Code available for payment source

## 2022-03-12 DIAGNOSIS — Z1231 Encounter for screening mammogram for malignant neoplasm of breast: Secondary | ICD-10-CM | POA: Diagnosis not present

## 2022-03-14 NOTE — Progress Notes (Signed)
Please call patient. Normal mammogram.  Repeat in 1 year.  

## 2022-06-24 ENCOUNTER — Telehealth: Payer: Self-pay | Admitting: Family Medicine

## 2022-06-24 NOTE — Telephone Encounter (Signed)
Please call pt. She is due for 5 year pap. Nos ure if still goes to Dr. Phineas Real.

## 2022-06-26 NOTE — Telephone Encounter (Signed)
Attemtped call to patient. Left voice mail requesting a return call.

## 2022-06-27 NOTE — Telephone Encounter (Signed)
Called pt she stated that Dr. Audie Box has retired. She asked if Dr. Linford Arnold would do this for her and I told her that she could and transferred her to the scheduler to make an appointment.

## 2022-07-22 ENCOUNTER — Other Ambulatory Visit (HOSPITAL_COMMUNITY)
Admission: RE | Admit: 2022-07-22 | Discharge: 2022-07-22 | Disposition: A | Discharge status: Home / Self Care | Payer: No Typology Code available for payment source | Source: Ambulatory Visit | Attending: Family Medicine | Admitting: Family Medicine

## 2022-07-22 ENCOUNTER — Encounter: Payer: Self-pay | Admitting: Family Medicine

## 2022-07-22 ENCOUNTER — Ambulatory Visit (INDEPENDENT_AMBULATORY_CARE_PROVIDER_SITE_OTHER): Payer: No Typology Code available for payment source | Admitting: Family Medicine

## 2022-07-22 VITALS — BP 139/64 | HR 75 | Ht 62.0 in | Wt 194.1 lb

## 2022-07-22 DIAGNOSIS — Z124 Encounter for screening for malignant neoplasm of cervix: Secondary | ICD-10-CM | POA: Insufficient documentation

## 2022-07-22 DIAGNOSIS — Z0000 Encounter for general adult medical examination without abnormal findings: Secondary | ICD-10-CM | POA: Diagnosis present

## 2022-07-22 NOTE — Progress Notes (Signed)
Complete physical exam  Patient: Jessica Mullen   DOB: Mar 07, 1961   61 y.o. Female  MRN: [id number]  Subjective:    Chief Complaint  Patient presents with   Annual Exam    Pap smear    Jessica Mullen is a 61 y.o. female who presents today for a complete physical exam. She reports consuming a general diet. The patient does not participate in regular exercise at present. She generally feels well. She reports sleeping well. She does not have additional problems to discuss today.    Most recent fall risk assessment:    09/08/2019   12:20 PM  Fall Risk   Falls in the past year? 0  Number falls in past yr: 0  Injury with Fall? 0     Most recent depression screenings:    09/29/2019    2:08 PM 09/08/2019   12:21 PM  PHQ 2/9 Scores  PHQ - 2 Score 2 0  PHQ- 9 Score 10         Patient Care Team: Agapito Games, MD as PCP - General   Outpatient Medications Prior to Visit  Medication Sig   valACYclovir (VALTREX) 1000 MG tablet Take 1 tablet (1,000 mg total) by mouth daily.   No facility-administered medications prior to visit.    ROS        Objective:     BP 139/64 (BP Location: Left Arm, Patient Position: Sitting, Cuff Size: Large)   Pulse 75   Ht 5\' 2"  (1.575 m)   Wt 194 lb 1.9 oz (88.1 kg)   SpO2 99%   BMI 35.51 kg/m    Physical Exam Vitals and nursing note reviewed. Exam conducted with a chaperone present.  Constitutional:      Appearance: Normal appearance. She is well-developed.  HENT:     Head: Normocephalic and atraumatic.     Right Ear: Tympanic membrane, ear canal and external ear normal.     Left Ear: Tympanic membrane, ear canal and external ear normal.     Nose: Nose normal.     Mouth/Throat:     Mouth: Mucous membranes are moist.     Pharynx: Oropharynx is clear. No oropharyngeal exudate or posterior oropharyngeal erythema.  Eyes:     Conjunctiva/sclera: Conjunctivae normal.     Pupils: Pupils are equal, round, and reactive  to light.  Neck:     Thyroid: No thyromegaly.  Cardiovascular:     Rate and Rhythm: Normal rate and regular rhythm.     Heart sounds: Normal heart sounds.  Pulmonary:     Effort: Pulmonary effort is normal.     Breath sounds: Normal breath sounds. No wheezing.  Chest:  Breasts:    Right: Normal.     Left: Normal.  Abdominal:     General: Bowel sounds are normal.     Palpations: Abdomen is soft.  Genitourinary:    General: Normal vulva.  Musculoskeletal:     Cervical back: Neck supple.  Lymphadenopathy:     Cervical: No cervical adenopathy.     Upper Body:     Right upper body: No supraclavicular, axillary or pectoral adenopathy.     Left upper body: No supraclavicular, axillary or pectoral adenopathy.  Skin:    General: Skin is warm and dry.  Neurological:     General: No focal deficit present.     Mental Status: She is alert and oriented to person, place, and time.  Psychiatric:  Behavior: Behavior normal.      No results found for any visits on 07/22/22.     Assessment & Plan:    Routine Health Maintenance and Physical Exam  Immunization History  Administered Date(s) Administered   Influenza-Unspecified 05/18/2017, 05/18/2018, 05/19/2019   Moderna Sars-Covid-2 Vaccination 12/09/2019, 01/06/2020   Tdap 09/29/2019    Health Maintenance  Topic Date Due   Zoster Vaccines- Shingrix (1 of 2) Never done   COVID-19 Vaccine (3 - 2023-24 season) 04/18/2022   PAP SMEAR-Modifier  05/11/2022   INFLUENZA VACCINE  11/16/2022 (Originally 03/18/2022)   MAMMOGRAM  03/13/2023   COLONOSCOPY (Pts 45-110yrs Insurance coverage will need to be confirmed)  07/24/2026   DTaP/Tdap/Td (2 - Td or Tdap) 09/28/2029   Hepatitis C Screening  Completed   HIV Screening  Completed   Pneumococcal Vaccine 34-71 Years old  Aged Out   HPV VACCINES  Aged Out    Discussed health benefits of physical activity, and encouraged her to engage in regular exercise appropriate for her age and  condition.  Problem List Items Addressed This Visit   None Visit Diagnoses     Wellness examination    -  Primary   Relevant Orders   Lipid Panel w/reflex Direct LDL   COMPLETE METABOLIC PANEL WITH GFR   CBC   Cytology - PAP   Screening for cervical cancer       Relevant Orders   Cytology - PAP       Keep up a regular exercise program and make sure you are eating a healthy diet Try to eat 4 servings of dairy a day, or if you are lactose intolerant take a calcium with vitamin D daily.  Encouraged her to consider getting an updated shingles vaccine.  Return in about 1 year (around 07/23/2023) for Wellness Exam.     Nani Gasser, MD

## 2022-07-23 LAB — CYTOLOGY - PAP
Comment: NEGATIVE
Diagnosis: NEGATIVE
High risk HPV: NEGATIVE

## 2022-07-23 NOTE — Progress Notes (Signed)
HI Jessica Mullen,  Your blood glucose was elevated so working to call the lab and see if we can add a hemoglobin A1c to see if we can better determine if your blood sugars have been elevated for more an extended period of time.  Liver and kidney function look great.  Total cholesterol and LDL look much better compared to 2 years ago.  Great job in bringing that down.  Your blood count is normal.  And your Pap smear looks great.  Recommend repeat Pap smear in 5 years.

## 2022-07-26 LAB — COMPLETE METABOLIC PANEL WITHOUT GFR
AG Ratio: 1.6 (calc) (ref 1.0–2.5)
ALT: 27 U/L (ref 6–29)
AST: 20 U/L (ref 10–35)
Albumin: 4.4 g/dL (ref 3.6–5.1)
Alkaline phosphatase (APISO): 70 U/L (ref 37–153)
BUN: 13 mg/dL (ref 7–25)
CO2: 29 mmol/L (ref 20–32)
Calcium: 9.3 mg/dL (ref 8.6–10.4)
Chloride: 103 mmol/L (ref 98–110)
Creat: 0.69 mg/dL (ref 0.50–1.03)
Globulin: 2.7 g/dL (ref 1.9–3.7)
Glucose, Bld: 126 mg/dL — ABNORMAL HIGH (ref 65–99)
Potassium: 4.3 mmol/L (ref 3.5–5.3)
Sodium: 141 mmol/L (ref 135–146)
Total Bilirubin: 0.5 mg/dL (ref 0.2–1.2)
Total Protein: 7.1 g/dL (ref 6.1–8.1)
eGFR: 100 mL/min/1.73m2

## 2022-07-26 LAB — CBC
HCT: 42.7 % (ref 35.0–45.0)
Hemoglobin: 14.4 g/dL (ref 11.7–15.5)
MCH: 30 pg (ref 27.0–33.0)
MCHC: 33.7 g/dL (ref 32.0–36.0)
MCV: 89 fL (ref 80.0–100.0)
MPV: 9.3 fL (ref 7.5–12.5)
Platelets: 284 10*3/uL (ref 140–400)
RBC: 4.8 10*6/uL (ref 3.80–5.10)
RDW: 12.2 % (ref 11.0–15.0)
WBC: 7.7 10*3/uL (ref 3.8–10.8)

## 2022-07-26 LAB — LIPID PANEL W/REFLEX DIRECT LDL
Cholesterol: 144 mg/dL
HDL: 38 mg/dL — ABNORMAL LOW
LDL Cholesterol (Calc): 84 mg/dL
Non-HDL Cholesterol (Calc): 106 mg/dL
Total CHOL/HDL Ratio: 3.8 (calc)
Triglycerides: 120 mg/dL

## 2022-07-26 LAB — HEMOGLOBIN A1C W/OUT EAG: Hgb A1c MFr Bld: 6.4 % of total Hgb — ABNORMAL HIGH (ref <5.7)

## 2022-07-28 ENCOUNTER — Encounter: Payer: Self-pay | Admitting: Family Medicine

## 2022-07-28 DIAGNOSIS — R7301 Impaired fasting glucose: Secondary | ICD-10-CM | POA: Insufficient documentation

## 2022-07-28 NOTE — Progress Notes (Signed)
HI Jessica Mullen, your A1c was 6.4 which is in the prediabetes range.  Prediabetes is greater than 5.7 up to 6.4.  Full-blown diabetes is 6.5 or higher.  I would really encourage you to work on cutting back on intake of sugars and sweets and also really work on portion controlling your carbohydrate intake.  Carbohydrates and starches get turned into sugar in the body.  Really work on trying to increase her vegetable intake and lean proteins would like to see you back in the office in about 2 to 3 months so that we can recheck your A1c and make sure that we are moving in the right direction and bringing those numbers down.

## 2022-08-15 ENCOUNTER — Encounter: Payer: Self-pay | Admitting: Family Medicine

## 2022-08-15 MED ORDER — OSELTAMIVIR PHOSPHATE 75 MG PO CAPS: 75.0000 mg | ORAL_CAPSULE | Freq: Every day | ORAL | 0 refills | Status: DC

## 2022-09-19 ENCOUNTER — Encounter: Payer: Self-pay | Admitting: Family Medicine

## 2022-09-23 ENCOUNTER — Ambulatory Visit
Admission: EM | Admit: 2022-09-23 | Discharge: 2022-09-23 | Disposition: A | Discharge status: Home / Self Care | Payer: No Typology Code available for payment source | Attending: Urgent Care | Admitting: Urgent Care

## 2022-09-23 ENCOUNTER — Encounter: Payer: Self-pay | Admitting: Emergency Medicine

## 2022-09-23 DIAGNOSIS — Z20822 Contact with and (suspected) exposure to covid-19: Secondary | ICD-10-CM | POA: Insufficient documentation

## 2022-09-23 DIAGNOSIS — Z1152 Encounter for screening for COVID-19: Secondary | ICD-10-CM | POA: Diagnosis not present

## 2022-09-23 DIAGNOSIS — R519 Headache, unspecified: Secondary | ICD-10-CM | POA: Diagnosis present

## 2022-09-23 DIAGNOSIS — M25572 Pain in left ankle and joints of left foot: Secondary | ICD-10-CM | POA: Diagnosis not present

## 2022-09-23 DIAGNOSIS — B349 Viral infection, unspecified: Secondary | ICD-10-CM | POA: Insufficient documentation

## 2022-09-23 LAB — POCT INFLUENZA A/B
Influenza A, POC: NEGATIVE
Influenza B, POC: NEGATIVE

## 2022-09-23 LAB — POC SARS CORONAVIRUS 2 AG -  ED: SARS Coronavirus 2 Ag: NEGATIVE

## 2022-09-23 NOTE — ED Provider Notes (Signed)
[facility]    CSN: [id number] Arrival date & time: 09/23/22  1022      History   Chief Complaint Chief Complaint  Patient presents with   Sore Throat    HPI RILY NICKEY is a 62 y.o. female.   62 year old female presents today due to concerns of a headache, sore throat, nasal congestion, fever, and dry cough since Sunday.  Patient reports her mom was diagnosed with COVID 1 week ago, her last known exposure to COVID was 7 days prior to her symptom onset.  Patient also reports some mild body aches and joint pain, but states that this is normal for her.  Patient has been taking over-the-counter DayQuil and NyQuil along with Tylenol and ibuprofen.  This will help intermittently with her headaches.  Patient was prescribed Tamiflu prophylactically back in December, but never had the flu herself.   Sore Throat    Past Medical History:  Diagnosis Date   Acute meniscal tear of left knee    CMV (cytomegalovirus infection) ([place])    Endometriosis    Environmental allergies    IBS (irritable bowel syndrome)    OA (osteoarthritis) of knee    BILATERAL KNEE   TMJ (dislocation of temporomandibular joint)    Wears glasses     Patient Active Problem List   Diagnosis Date Noted   IFG (impaired fasting glucose) 07/28/2022   Pain of joint of left ankle and foot 08/11/2019   Strain of peroneal tendon 07/21/2019   Left temporal headache 07/03/2015   Mitral valve prolapse syndrome 05/08/2012   Endometriosis    MVP (mitral valve prolapse)    FATIGUE 11/07/2008    Past Surgical History:  Procedure Laterality Date   APPENDECTOMY     CESAREAN [place] W/ HYSTEROSCOPY  10-31-2005   W/ BX AND ALBATION ENDOMETRIOSIS   CYSTO/  LEFT URETERAL STENT PLACEMENT  AS CHILD   EXPLORATORY LAPAROTOMY/  LEFT SALPINGOOPHORECTOMY/  ABLATION ENDOMETRIOSIS  04-20-2002   KNEE ARTHROSCOPY Left 10/21/2013   Procedure: LEFT ARTHROSCOPY KNEE WITH  DEBRIDEMENT;  Surgeon: Mauri Pole, MD;  Location: [facility];  Service: Orthopedics;  Laterality: Left;   TRANSTHORACIC ECHOCARDIOGRAM  05-09-2012   NORMAL STUDY/  EF 60-65%/  NO EVIDENCE MVP   URETERAL REIMPLANTION Left AGE 40    OB History     Gravida  2   Para  2   Term      Preterm      AB  0   Living  2      SAB      IAB      Ectopic      Multiple      Live Births               Home Medications    Prior to Admission medications   Medication Sig Start Date End Date Taking? Authorizing Provider  valACYclovir (VALTREX) 1000 MG tablet Take 1 tablet (1,000 mg total) by mouth daily. 11/08/21   Hali Marry, MD    Family History Family History  Problem Relation Age of Onset   Diabetes Father    Cancer Father        lung, leukemia   COPD Father    Heart disease Father    Hypertension Mother    Thyroid disease Mother    Depression Mother    Cancer Maternal Grandfather  COLON   Healthy Sister    Diabetes Brother    Healthy Sister     Social History Social History   Tobacco Use   Smoking status: Never   Smokeless tobacco: Never  Vaping Use   Vaping Use: Never used  Substance Use Topics   Alcohol use: Yes    Alcohol/week: 0.0 standard drinks of alcohol    Comment: Rare   Drug use: No     Allergies   Propoxyphene n-acetaminophen, Adhesive [tape], and Bee venom   Review of Systems Review of Systems As per HPI  Physical Exam Triage Vital Signs ED Triage Vitals  Enc Vitals Group     BP 09/23/22 1038 133/88     Pulse Rate 09/23/22 1038 70     Resp 09/23/22 1038 16     Temp 09/23/22 1038 98.1 F (36.7 C)     Temp Source 09/23/22 1038 Oral     SpO2 09/23/22 1038 98 %     Weight 09/23/22 1040 193 lb (87.5 kg)     Height 09/23/22 1040 5\' 2"  (1.575 m)     Head Circumference --      Peak Flow --      Pain Score 09/23/22 1039 4     Pain Loc --      Pain Edu? --      Excl. in [place]? --    No data  found.  Updated Vital Signs BP 133/88 (BP Location: Right Arm)   Pulse 70   Temp 98.1 F (36.7 C) (Oral)   Resp 16   Ht 5\' 2"  (1.575 m)   Wt 193 lb (87.5 kg)   SpO2 98%   BMI 35.30 kg/m   Visual Acuity Right Eye Distance:   Left Eye Distance:   Bilateral Distance:    Right Eye Near:   Left Eye Near:    Bilateral Near:     Physical Exam Vitals and nursing note reviewed.  Constitutional:      General: She is not in acute distress.    Appearance: She is well-developed. She is obese. She is not ill-appearing, toxic-appearing or diaphoretic.  HENT:     Head: Normocephalic and atraumatic.     Right Ear: Tympanic membrane, ear canal and external ear normal. No drainage, swelling or tenderness. No middle ear effusion. There is no impacted cerumen. Tympanic membrane is not erythematous.     Left Ear: Tympanic membrane, ear canal and external ear normal. No drainage, swelling or tenderness.  No middle ear effusion. There is no impacted cerumen. Tympanic membrane is not erythematous.     Nose: Nose normal. No congestion or rhinorrhea.     Mouth/Throat:     Mouth: Mucous membranes are moist. No oral lesions.     Pharynx: Oropharynx is clear. Uvula midline. No pharyngeal swelling, oropharyngeal exudate, posterior oropharyngeal erythema or uvula swelling.     Tonsils: No tonsillar exudate or tonsillar abscesses.  Eyes:     General: No scleral icterus.       Right eye: No discharge.        Left eye: No discharge.     Extraocular Movements: Extraocular movements intact.     Conjunctiva/sclera: Conjunctivae normal.     Pupils: Pupils are equal, round, and reactive to light.  Cardiovascular:     Rate and Rhythm: Normal rate and regular rhythm.     Pulses: Normal pulses.     Heart sounds: Normal heart sounds. No murmur heard.  No gallop.  Pulmonary:     Effort: Pulmonary effort is normal. No respiratory distress.     Breath sounds: Normal breath sounds. No stridor. No wheezing,  rhonchi or rales.  Chest:     Chest wall: No tenderness.  Musculoskeletal:        General: No swelling or tenderness. Normal range of motion.     Cervical back: Normal range of motion and neck supple. No tenderness.     Right lower leg: No edema.     Left lower leg: No edema.  Lymphadenopathy:     Cervical: No cervical adenopathy.  Skin:    General: Skin is warm and dry.     Findings: No erythema or rash.  Neurological:     General: No focal deficit present.     Mental Status: She is alert and oriented to person, place, and time.  Psychiatric:        Mood and Affect: Mood normal.      UC Treatments / Results  Labs (all labs ordered are listed, but only abnormal results are displayed) Labs Reviewed  SARS CORONAVIRUS 2 (TAT 6-24 HRS)  POCT INFLUENZA A/B  POC SARS CORONAVIRUS 2 AG -  ED    EKG   Radiology No results found.  Procedures Procedures (including critical care time)  Medications Ordered in UC Medications - No data to display  Initial Impression / Assessment and Plan / UC Course  I have reviewed the triage vital signs and the nursing notes.  Pertinent labs & imaging results that were available during my care of the patient were reviewed by me and considered in my medical decision making (see chart for details).     Viral syndrome -patient with exposure to COVID recently, symptoms sound consistent with this however in office test negative.  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  If positive, patient will need to remain out of work all week.  Patient is not a candidate for antiviral therapy given her lack of risk factors.  Supportive measures appears appropriate at this time.   Final Clinical Impressions(s) / UC Diagnoses   Final diagnoses:  Acute viral syndrome     Discharge Instructions      Your rapid COVID and flu test are negative. Given your symptoms and known exposure, I have sent  out a PCR confirmation COVID test. We will notify you once results are obtained, you can also access them on MyChart. Your symptoms sound viral. Please continue alternating ibuprofen and Tylenol as needed.   Purchase over-the-counter Oscillococcinum to help with body aches and fatigue     ED Prescriptions   None    PDMP not reviewed this encounter.   Chaney Malling, [place] 09/23/22 1118

## 2022-09-23 NOTE — Discharge Instructions (Signed)
Your rapid COVID and flu test are negative. Given your symptoms and known exposure, I have sent out a PCR confirmation COVID test. We will notify you once results are obtained, you can also access them on MyChart. Your symptoms sound viral. Please continue alternating ibuprofen and Tylenol as needed.   Purchase over-the-counter Oscillococcinum to help with body aches and fatigue

## 2022-09-23 NOTE — ED Triage Notes (Signed)
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

## 2022-09-24 LAB — SARS CORONAVIRUS 2 (TAT 6-24 HRS): SARS Coronavirus 2: NEGATIVE

## 2022-12-02 ENCOUNTER — Encounter: Payer: Self-pay | Admitting: Family Medicine

## 2022-12-31 ENCOUNTER — Ambulatory Visit (INDEPENDENT_AMBULATORY_CARE_PROVIDER_SITE_OTHER): Payer: No Typology Code available for payment source | Admitting: Family Medicine

## 2022-12-31 ENCOUNTER — Encounter: Payer: Self-pay | Admitting: Family Medicine

## 2022-12-31 VITALS — BP 124/75 | HR 84 | Ht 62.0 in | Wt 201.0 lb

## 2022-12-31 DIAGNOSIS — B009 Herpesviral infection, unspecified: Secondary | ICD-10-CM | POA: Diagnosis not present

## 2022-12-31 DIAGNOSIS — R7301 Impaired fasting glucose: Secondary | ICD-10-CM

## 2022-12-31 LAB — POCT GLYCOSYLATED HEMOGLOBIN (HGB A1C): Hemoglobin A1C: 6.3 % — AB (ref 4.0–5.6)

## 2022-12-31 MED ORDER — METFORMIN HCL ER 500 MG PO TB24: 500.0000 mg | ORAL_TABLET | Freq: Every day | ORAL | 1 refills | Status: DC

## 2022-12-31 NOTE — Assessment & Plan Note (Signed)
With her strong family history and A1c still in the low sixes we did discuss metformin and that there are good studies to show reduction in progression to diabetes with starting metformin during prediabetes stage.  She is open to trying the medication.  Discussed potential side effects.  Follow-up in 3 to 4 months.  Labs are up-to-date.

## 2022-12-31 NOTE — Progress Notes (Signed)
   Established Patient Office Visit  Subjective   Patient ID: Jessica Mullen, female    DOB: 27-May-1961  Age: 62 y.o. MRN: [id number]  Chief Complaint  Patient presents with   ifg    HPI   Impaired fasting glucose-no increased thirst or urination. No symptoms consistent with hypoglycemia.  She is a very strong family history diabetes in her father, brother and sister.  Her father and her brother were both insulin-dependent.  She still walking 5 to 6 miles a day at work but is scheduled for meniscal repair on one of her knees in June.  She feels like overall she does pretty good with her diet she does not eat a carb heavy or sugar heavy diet.    ROS    Objective:     BP 124/75   Pulse 84   Ht 5\' 2"  (1.575 m)   Wt 201 lb (91.2 kg)   SpO2 97%   BMI 36.76 kg/m    Physical Exam Vitals and nursing note reviewed.  Constitutional:      Appearance: She is well-developed.  HENT:     Head: Normocephalic and atraumatic.  Cardiovascular:     Rate and Rhythm: Normal rate and regular rhythm.     Heart sounds: Normal heart sounds.  Pulmonary:     Effort: Pulmonary effort is normal.     Breath sounds: Normal breath sounds.  Skin:    General: Skin is warm and dry.  Neurological:     Mental Status: She is alert and oriented to person, place, and time.  Psychiatric:        Behavior: Behavior normal.      Results for orders placed or performed in visit on 12/31/22  POCT glycosylated hemoglobin (Hb A1C)  Result Value Ref Range   Hemoglobin A1C 6.3 (A) 4.0 - 5.6 %   HbA1c POC (<> result, manual entry)     HbA1c, POC (prediabetic range)     HbA1c, POC (controlled diabetic range)        The 10-year ASCVD risk score (Arnett DK, et al., 2019) is: 3.1%    Assessment & Plan:   Problem List Items Addressed This Visit       Endocrine   IFG (impaired fasting glucose) - Primary    With her strong family history and A1c still in the low sixes we did discuss metformin  and that there are good studies to show reduction in progression to diabetes with starting metformin during prediabetes stage.  She is open to trying the medication.  Discussed potential side effects.  Follow-up in 3 to 4 months.  Labs are up-to-date.      Relevant Medications   metFORMIN (GLUCOPHAGE-XR) 500 MG 24 hr tablet   Other Relevant Orders   POCT glycosylated hemoglobin (Hb A1C) (Completed)     Other   Herpes simplex   Encouraged her to consider getting the shingles vaccine.  She did have a shingles outbreak about 10 years ago.  So she is safe to go ahead and progress with the vaccine.  Were happy to give at any point.  Ischial blood pressure is a little elevated today.  Will recheck.  Return in about 4 months (around 05/03/2023) for Pre-diabetes.   I spent 25 minutes on the day of the encounter to include pre-visit record review, face-to-face time with the patient and post visit ordering of test.   Nani Gasser, MD

## 2023-01-10 ENCOUNTER — Encounter: Payer: Self-pay | Admitting: Family Medicine

## 2023-01-28 ENCOUNTER — Ambulatory Visit: Payer: No Typology Code available for payment source | Admitting: Bariatrics

## 2023-01-28 ENCOUNTER — Encounter: Payer: Self-pay | Admitting: Bariatrics

## 2023-01-28 VITALS — BP 123/83 | HR 85 | Temp 98.4°F | Ht 61.0 in | Wt 194.0 lb

## 2023-01-28 DIAGNOSIS — R7303 Prediabetes: Secondary | ICD-10-CM

## 2023-01-28 DIAGNOSIS — E65 Localized adiposity: Secondary | ICD-10-CM

## 2023-01-28 DIAGNOSIS — Z6836 Body mass index (BMI) 36.0-36.9, adult: Secondary | ICD-10-CM

## 2023-01-28 DIAGNOSIS — E669 Obesity, unspecified: Secondary | ICD-10-CM

## 2023-01-28 NOTE — Progress Notes (Signed)
Office: [phone]  /  Fax: [phone]   Initial Visit  Jessica Mullen was seen in clinic today to evaluate for obesity. She is interested in losing weight to improve overall health and reduce the risk of weight related complications. She presents today to review program treatment options, initial physical assessment, and evaluation.     She was referred by: Self-Referral  When asked what else they would like to accomplish? She states: Adopt healthier eating patterns, Improve energy levels and physical activity, Improve existing medical conditions, and Improve quality of life  When asked how has your weight affected you? She states: Having fatigue and Having poor endurance  Some associated conditions: Prediabetes  Contributing factors: Family history  Weight promoting medications identified: None  Current nutrition plan: None and Portion control / smart choices  Current level of physical activity: walking  Current or previous pharmacotherapy: None  Response to medication: Never tried medications   Past medical history includes:   Past Medical History:  Diagnosis Date   Acute meniscal tear of left knee    CMV (cytomegalovirus infection) (HCC)    Endometriosis    Environmental allergies    IBS (irritable bowel syndrome)    OA (osteoarthritis) of knee    BILATERAL KNEE   TMJ (dislocation of temporomandibular joint)    Wears glasses      Objective:   BP 123/83   Pulse 85   Temp 98.4 F (36.9 C)   Ht 5\' 1"  (1.549 m)   Wt 194 lb (88 kg)   SpO2 96%   BMI 36.66 kg/m  She was weighed on the bioimpedance scale: Body mass index is 36.66 kg/m.  Peak Weight:194 lbs , Body Fat%:46 %, Visceral Fat Rating:14, Weight trend over the last 12 months: Increasing  General:  Alert, oriented and cooperative. Patient is in no acute distress.  Respiratory: Normal respiratory effort, no problems with respiration noted  Extremities: Normal range of motion.    Mental Status:  Normal mood and affect. Normal behavior. Normal judgment and thought content.   DIAGNOSTIC DATA REVIEWED:  BMET    Component Value Date/Time   NA 141 07/22/2022 0927   K 4.3 07/22/2022 0927   CL 103 07/22/2022 0927   CO2 29 07/22/2022 0927   GLUCOSE 126 (H) 07/22/2022 0927   BUN 13 07/22/2022 0927   CREATININE 0.69 07/22/2022 0927   CALCIUM 9.3 07/22/2022 0927   GFRNONAA >60 09/24/2020 1509   GFRNONAA 79 09/29/2019 1413   GFRAA 91 09/29/2019 1413   Lab Results  Component Value Date   HGBA1C 6.3 (A) 12/31/2022   HGBA1C 6.4 (H) 07/22/2022   No results found for: "INSULIN" CBC    Component Value Date/Time   WBC 7.7 07/22/2022 0927   RBC 4.80 07/22/2022 0927   HGB 14.4 07/22/2022 0927   HCT 42.7 07/22/2022 0927   PLT 284 07/22/2022 0927   MCV 89.0 07/22/2022 0927   MCH 30.0 07/22/2022 0927   MCHC 33.7 07/22/2022 0927   RDW 12.2 07/22/2022 0927   Iron/TIBC/Ferritin/ %Sat No results found for: "IRON", "TIBC", "FERRITIN", "IRONPCTSAT" Lipid Panel     Component Value Date/Time   CHOL 144 07/22/2022 0927   TRIG 120 07/22/2022 0927   HDL 38 (L) 07/22/2022 0927   CHOLHDL 3.8 07/22/2022 0927   VLDL 30 04/16/2016 1559   LDLCALC 84 07/22/2022 0927   Hepatic Function Panel     Component Value Date/Time   PROT 7.1 07/22/2022 0927   ALBUMIN 4.5 09/24/2020  1509   AST 20 07/22/2022 0927   ALT 27 07/22/2022 0927   ALKPHOS 75 09/24/2020 1509   BILITOT 0.5 07/22/2022 0927   BILIDIR 0.1 06/15/2012 1206   IBILI 0.5 06/15/2012 1206      Component Value Date/Time   TSH 3.02 09/29/2019 1413     Assessment and Plan:       Visceral Obesity.   She has a visceral fat rating of 14 per the bio-impedence scale.   Plan: The goal is a visceral fat rating of 13 or below.  Will work on the plan and increase exercise/begin exercise.  Information sheet on " Tips to lose belly fat ". Aware that belly fat may be equate to visceral fat, but many of the same tips can help both  subcutaneous and visceral fat.  Will minimize all carbohydrates ( sweets and starches ).    2.  Prediabetes:  She was recently diagnosed with prediabetes. She does have a family history.   Plan:  Will begin a plan for weight loss and lowering her carbohydrates.    Generalized Obesity: Current BMI 36    Obesity Treatment / Action Plan: Will return to the office in the near future for a plan for her diet and exercise.   Patient will work on garnering support from family and friends to begin weight loss journey. Will work on eliminating or reducing the presence of highly palatable, calorie dense foods in the home. Will complete provided nutritional and psychosocial assessment questionnaire before the next appointment. Will be scheduled for indirect calorimetry to determine resting energy expenditure in a fasting state.  This will allow [place] to create a reduced calorie, high-protein meal plan to promote loss of fat mass while preserving muscle mass. Counseled on the health benefits of losing 5%-15% of total body weight. Was counseled on nutritional approaches to weight loss and benefits of reducing processed foods and consuming plant-based foods and high quality protein as part of nutritional weight management. Was counseled on pharmacotherapy and role as an adjunct in weight management.   Obesity Education Performed Today:  She was weighed on the bioimpedance scale and results were discussed and documented in the synopsis.  We discussed obesity as a disease and the importance of a more detailed evaluation of all the factors contributing to the disease.  We discussed the importance of long term lifestyle changes which include nutrition, exercise and behavioral modifications as well as the importance of customizing this to her specific health and social needs.  We discussed the benefits of reaching a healthier weight to alleviate the symptoms of existing conditions and reduce the risks of the  biomechanical, metabolic and psychological effects of obesity.  Discussed New Patient/ Polices. Patient voiced understanding and allowed to ask questions.   Jessica Mullen appears to be in the action stage of change and states they are ready to start intensive lifestyle modifications and behavioral modifications.  30 minutes was spent today on this visit including the above counseling, pre-visit chart review, and post-visit documentation.  Reviewed by clinician on day of visit: allergies, medications, problem list, medical history, surgical history, family history, social history, and previous encounter notes.    Layla Kesling A. Lorretta HarpO.

## 2023-03-18 ENCOUNTER — Ambulatory Visit: Payer: No Typology Code available for payment source | Admitting: Bariatrics

## 2023-03-19 ENCOUNTER — Encounter: Payer: Self-pay | Admitting: Family Medicine

## 2023-03-19 ENCOUNTER — Ambulatory Visit (INDEPENDENT_AMBULATORY_CARE_PROVIDER_SITE_OTHER): Payer: No Typology Code available for payment source | Admitting: Family Medicine

## 2023-03-19 VITALS — BP 135/86 | HR 72 | Ht 62.0 in | Wt 188.0 lb

## 2023-03-19 DIAGNOSIS — H6123 Impacted cerumen, bilateral: Secondary | ICD-10-CM | POA: Diagnosis not present

## 2023-03-19 DIAGNOSIS — Z1231 Encounter for screening mammogram for malignant neoplasm of breast: Secondary | ICD-10-CM

## 2023-03-19 NOTE — Progress Notes (Signed)
   Established Patient Office Visit  Subjective   Patient ID: Jessica Mullen, female    DOB: 03/09/1961  Age: 62 y.o. MRN: [id number]  Chief Complaint  Patient presents with   Cerumen Impaction    HPI  She is here today for bilateral cerumen impaction.  She has problems with this before and lately she has been using mineral oil which she does think is helpful but it was getting to the point where she felt like she could not hear out of her ears and could not get in with the ENT to have it irrigated for couple of months.    ROS    Objective:     BP 135/86   Pulse 72   Ht 5\' 2"  (1.575 m)   Wt 188 lb (85.3 kg)   SpO2 99%   BMI 34.39 kg/m    Physical Exam Constitutional:      Appearance: Normal appearance.  HENT:     Head: Normocephalic.     Right Ear: There is impacted cerumen.     Left Ear: There is impacted cerumen.      No results found for any visits on 03/19/23.    The 10-year ASCVD risk score (Arnett DK, et al., 2019) is: 3.6%    Assessment & Plan:   Problem List Items Addressed This Visit   None Visit Diagnoses     Screening mammogram for breast cancer    -  Primary   Relevant Orders   MM 3D SCREENING MAMMOGRAM BILATERAL BREAST   Bilateral impacted cerumen          Cerumen  impaction with hearing loss-irrigation performed today.  Patient tolerated really well.  Canals and TMs look great on exam after removal.  Also placed order to get updated mammogram.    Indication: Cerumen impaction of the ear(s) Medical necessity statement: On physical examination, cerumen impairs clinically significant portions of the external auditory canal, and tympanic membrane. Noted obstructive, copious cerumen that cannot be removed without magnification and instrumentation Consent: Discussed benefits and risks of procedure and verbal consent obtained Procedure: Patient was prepped for the procedure. Utilized an otoscope to assess and take note of the ear canal,  the tympanic membrane, and the presence, amount, and placement of the cerumen. Gentle water irrigation and soft plastic curette was utilized to remove cerumen.  Post procedure examination: shows cerumen was completely removed. Patient tolerated procedure well. The patient is made aware that they may experience temporary vertigo, temporary hearing loss, and temporary discomfort. If these symptom last for more than 24 hours to call the clinic or proceed to the ED.    Return if symptoms worsen or fail to improve.    Nani Gasser, MD

## 2023-04-22 ENCOUNTER — Ambulatory Visit: Payer: No Typology Code available for payment source

## 2023-05-06 ENCOUNTER — Ambulatory Visit (INDEPENDENT_AMBULATORY_CARE_PROVIDER_SITE_OTHER): Payer: No Typology Code available for payment source | Admitting: Family Medicine

## 2023-05-06 ENCOUNTER — Encounter: Payer: Self-pay | Admitting: Family Medicine

## 2023-05-06 VITALS — BP 134/68 | HR 68 | Ht 62.0 in | Wt 186.0 lb

## 2023-05-06 DIAGNOSIS — R7301 Impaired fasting glucose: Secondary | ICD-10-CM

## 2023-05-06 LAB — POCT GLYCOSYLATED HEMOGLOBIN (HGB A1C): Hemoglobin A1C: 5.9 % — AB (ref 4.0–5.6)

## 2023-05-06 NOTE — Assessment & Plan Note (Signed)
A1c looks great today at 5.9 which is way down from prior of 6.3.  Tolerating the metformin okay has some occasional mild GI issues with it but it seems to be doing fair.  Our goal will be to get that A1c down to 5.7 and at that point we will plan to discontinue the metformin and continue to maintain with diet and exercise.   Lab Results  Component Value Date   HGBA1C 5.9 (A) 05/06/2023

## 2023-05-06 NOTE — Progress Notes (Signed)
Established Patient Office Visit  Subjective   Patient ID: Jessica Mullen, female    DOB: 06-22-1961  Age: 62 y.o. MRN: [id number]  Chief Complaint  Patient presents with   ifg    HPI Impaired fasting glucose-no increased thirst or urination. No symptoms consistent with hypoglycemia.  She is really doing great and make some fantastic changes.  She is really been diligent and cutting back on her carb intake now that she had her knee surgery and had her meniscus repaired she is moving a lot more.  She is actually down about 20 pounds.    ROS    Objective:     BP 134/68   Pulse 68   Ht 5\' 2"  (1.575 m)   Wt 186 lb (84.4 kg)   SpO2 96%   BMI 34.02 kg/m    Physical Exam Vitals and nursing note reviewed.  Constitutional:      Appearance: Normal appearance.  HENT:     Head: Normocephalic and atraumatic.  Eyes:     Conjunctiva/sclera: Conjunctivae normal.  Cardiovascular:     Rate and Rhythm: Normal rate and regular rhythm.  Pulmonary:     Effort: Pulmonary effort is normal.     Breath sounds: Normal breath sounds.  Skin:    General: Skin is warm and dry.  Neurological:     Mental Status: She is alert.  Psychiatric:        Mood and Affect: Mood normal.      Results for orders placed or performed in visit on 05/06/23  POCT HgB A1C  Result Value Ref Range   Hemoglobin A1C 5.9 (A) 4.0 - 5.6 %   HbA1c POC (<> result, manual entry)     HbA1c, POC (prediabetic range)     HbA1c, POC (controlled diabetic range)        The 10-year ASCVD risk score (Arnett DK, et al., 2019) is: 3.6%    Assessment & Plan:   Problem List Items Addressed This Visit       Endocrine   IFG (impaired fasting glucose) - Primary    A1c looks great today at 5.9 which is way down from prior of 6.3.  Tolerating the metformin okay has some occasional mild GI issues with it but it seems to be doing fair.  Our goal will be to get that A1c down to 5.7 and at that point we will plan to  discontinue the metformin and continue to maintain with diet and exercise.   Lab Results  Component Value Date   HGBA1C 5.9 (A) 05/06/2023         Relevant Orders   Basic Metabolic Panel (BMET)   POCT HgB A1C (Completed)    Encouraged her to think about getting her shingles vaccine we did discuss that today.  Mammogram is scheduled for this fall.  Discussed shingles vaccine.  Already has her mammogram scheduled.  Return in about 6 months (around 11/03/2023) for Diabetes follow-up.    Nani Gasser, MD

## 2023-05-07 LAB — BASIC METABOLIC PANEL
BUN/Creatinine Ratio: 20 (ref 12–28)
BUN: 14 mg/dL (ref 8–27)
CO2: 25 mmol/L (ref 20–29)
Calcium: 9.7 mg/dL (ref 8.7–10.3)
Chloride: 103 mmol/L (ref 96–106)
Creatinine, Ser: 0.71 mg/dL (ref 0.57–1.00)
Glucose: 107 mg/dL — ABNORMAL HIGH (ref 70–99)
Potassium: 4.4 mmol/L (ref 3.5–5.2)
Sodium: 144 mmol/L (ref 134–144)
eGFR: 97 mL/min/{1.73_m2} (ref >59)

## 2023-05-07 NOTE — Progress Notes (Signed)
Your lab work is within acceptable range and there are no concerning findings.   ?

## 2023-06-03 ENCOUNTER — Ambulatory Visit: Payer: No Typology Code available for payment source

## 2023-06-03 DIAGNOSIS — Z1231 Encounter for screening mammogram for malignant neoplasm of breast: Secondary | ICD-10-CM | POA: Diagnosis not present

## 2023-06-05 NOTE — Progress Notes (Signed)
Please call patient. Normal mammogram.  Repeat in 1 year.  

## 2023-07-29 ENCOUNTER — Ambulatory Visit (INDEPENDENT_AMBULATORY_CARE_PROVIDER_SITE_OTHER): Payer: No Typology Code available for payment source | Admitting: Family Medicine

## 2023-07-29 ENCOUNTER — Encounter: Payer: Self-pay | Admitting: Family Medicine

## 2023-07-29 VITALS — BP 124/58 | HR 73 | Ht 62.0 in | Wt 188.0 lb

## 2023-07-29 DIAGNOSIS — R7301 Impaired fasting glucose: Secondary | ICD-10-CM

## 2023-07-29 DIAGNOSIS — Z0000 Encounter for general adult medical examination without abnormal findings: Secondary | ICD-10-CM

## 2023-07-29 MED ORDER — METFORMIN HCL ER 500 MG PO TB24: 500.0000 mg | ORAL_TABLET | Freq: Every day | ORAL | 1 refills | Status: DC

## 2023-07-29 NOTE — Progress Notes (Deleted)
   Established Patient Office Visit  Subjective   Patient ID: Jessica Mullen, female    DOB: 15-Jun-1961  Age: 62 y.o. MRN: [id number]  Chief Complaint  Patient presents with   Annual Exam    HPI  {History (Optional):23778}  ROS    Objective:     BP (!) 124/58   Pulse 73   Ht 5\' 2"  (1.575 m)   Wt 188 lb (85.3 kg)   SpO2 97%   BMI 34.39 kg/m  {Vitals History (Optional):23777}  Physical Exam   No results found for any visits on 07/29/23.  {Labs (Optional):23779}  The 10-year ASCVD risk score (Arnett DK, et al., 2019) is: 3.1%    Assessment & Plan:   Problem List Items Addressed This Visit       Endocrine   IFG (impaired fasting glucose)   Other Visit Diagnoses     Wellness examination    -  Primary       No follow-ups on file.    Nani Gasser, MD

## 2023-07-29 NOTE — Progress Notes (Signed)
Complete physical exam  Patient: Jessica Mullen   DOB: [birth year]   62 y.o. Female  MRN: [id number]  Subjective:    Chief Complaint  Patient presents with   Annual Exam    Jessica Mullen is a 62 y.o. female who presents today for a complete physical exam. She reports consuming a general diet. The patient does not participate in regular exercise at present. She generally feels well. She reports sleeping poorly. She does not have additional problems to discuss today.    Most recent fall risk assessment:    03/19/2023    9:47 AM  Fall Risk   Falls in the past year? 0  Number falls in past yr: 0  Injury with Fall? 0  Risk for fall due to : No Fall Risks  Follow up Falls evaluation completed     Most recent depression screenings:    03/19/2023    9:47 AM 12/31/2022    9:07 AM  PHQ 2/9 Scores  PHQ - 2 Score 0 0         Patient Care Team: Agapito Games, MD as PCP - General   Outpatient Medications Prior to Visit  Medication Sig   [DISCONTINUED] metFORMIN (GLUCOPHAGE-XR) 500 MG 24 hr tablet Take 1 tablet (500 mg total) by mouth daily with breakfast.   No facility-administered medications prior to visit.    ROS        Objective:     BP (!) 124/58   Pulse 73   Ht 5\' 2"  (1.575 m)   Wt 188 lb (85.3 kg)   SpO2 97%   BMI 34.39 kg/m     Physical Exam Constitutional:      Appearance: Normal appearance.  HENT:     Head: Normocephalic and atraumatic.     Right Ear: Tympanic membrane, ear canal and external ear normal.     Left Ear: Tympanic membrane, ear canal and external ear normal.     Nose: Nose normal.     Mouth/Throat:     Pharynx: Oropharynx is clear.  Eyes:     Extraocular Movements: Extraocular movements intact.     Conjunctiva/sclera: Conjunctivae normal.     Pupils: Pupils are equal, round, and reactive to light.  Neck:     Thyroid: No thyromegaly.  Cardiovascular:     Rate and Rhythm: Normal rate and regular rhythm.  Pulmonary:      Effort: Pulmonary effort is normal.     Breath sounds: Normal breath sounds.  Abdominal:     General: Bowel sounds are normal.     Palpations: Abdomen is soft.     Tenderness: There is no abdominal tenderness.  Musculoskeletal:        General: No swelling.     Cervical back: Neck supple.  Skin:    General: Skin is warm and dry.  Neurological:     Mental Status: She is oriented to person, place, and time.  Psychiatric:        Mood and Affect: Mood normal.        Behavior: Behavior normal.      No results found for any visits on 07/29/23.      Assessment & Plan:    Routine Health Maintenance and Physical Exam  Immunization History  Administered Date(s) Administered   Influenza, Seasonal, Injecte, Preservative Fre 05/19/2023   Influenza-Unspecified 05/18/2017, 05/18/2018, 05/19/2019   Moderna Sars-Covid-2 Vaccination 12/09/2019, 01/06/2020   Tdap 09/29/2019    Health Maintenance  Topic Date  Due   Pneumococcal Vaccine 10-14 Years old (1 of 2 - PCV) 07/28/2024 (Originally 12/02/1966)   Zoster Vaccines- Shingrix (1 of 2) 07/28/2024 (Originally 12/02/2012)   COVID-19 Vaccine (3 - 2023-24 season) 08/13/2024 (Originally 04/19/2023)   MAMMOGRAM  06/02/2024   Colonoscopy  07/24/2026   Cervical Cancer Screening (HPV/Pap Cotest)  07/23/2027   DTaP/Tdap/Td (2 - Td or Tdap) 09/28/2029   INFLUENZA VACCINE  Completed   Hepatitis C Screening  Completed   HIV Screening  Completed   HPV VACCINES  Aged Out    Discussed health benefits of physical activity, and encouraged her to engage in regular exercise appropriate for her age and condition.  Problem List Items Addressed This Visit       Endocrine   IFG (impaired fasting glucose)   Relevant Medications   metFORMIN (GLUCOPHAGE-XR) 500 MG 24 hr tablet   Other Visit Diagnoses     Wellness examination    -  Primary   Relevant Orders   CMP14+EGFR   Lipid Panel With LDL/HDL Ratio   HgB A1c       Keep up a regular exercise  program and make sure you are eating a healthy diet Try to eat 4 servings of dairy a day, or if you are lactose intolerant take a calcium with vitamin D daily.  Your vaccines are up to date.   Return in about 1 year (around 07/28/2024) for Wellness Exam.     Nani Gasser, MD

## 2023-07-30 LAB — CMP14+EGFR
ALT: 24 [IU]/L (ref 0–32)
AST: 18 [IU]/L (ref 0–40)
Albumin: 4.5 g/dL (ref 3.8–4.9)
Alkaline Phosphatase: 86 [IU]/L (ref 44–121)
BUN/Creatinine Ratio: 19 (ref 12–28)
BUN: 16 mg/dL (ref 8–27)
Bilirubin Total: 0.5 mg/dL (ref 0.0–1.2)
CO2: 25 mmol/L (ref 20–29)
Calcium: 10 mg/dL (ref 8.7–10.3)
Chloride: 103 mmol/L (ref 96–106)
Creatinine, Ser: 0.83 mg/dL (ref 0.57–1.00)
Globulin, Total: 2.7 g/dL (ref 1.5–4.5)
Glucose: 125 mg/dL — ABNORMAL HIGH (ref 70–99)
Potassium: 4.6 mmol/L (ref 3.5–5.2)
Sodium: 142 mmol/L (ref 134–144)
Total Protein: 7.2 g/dL (ref 6.0–8.5)
eGFR: 81 mL/min/{1.73_m2} (ref >59)

## 2023-07-30 LAB — LIPID PANEL WITH LDL/HDL RATIO
Cholesterol, Total: 214 mg/dL — ABNORMAL HIGH (ref 100–199)
HDL: 46 mg/dL (ref >39)
LDL Chol Calc (NIH): 131 mg/dL — ABNORMAL HIGH (ref 0–99)
LDL/HDL Ratio: 2.8 {ratio} (ref 0.0–3.2)
Triglycerides: 206 mg/dL — ABNORMAL HIGH (ref 0–149)
VLDL Cholesterol Cal: 37 mg/dL (ref 5–40)

## 2023-07-30 LAB — HEMOGLOBIN A1C
Est. average glucose Bld gHb Est-mCnc: 140 mg/dL
Hgb A1c MFr Bld: 6.5 % — ABNORMAL HIGH (ref 4.8–5.6)

## 2023-07-30 NOTE — Progress Notes (Signed)
Hi Lorie, total cholesterol and LDL are mildly elevated just encouraged her to continue to work on healthy diet and regular exercise.  A1c is 6.5 which is technically in the diabetes range.  The metformin should help control your blood sugars.  Metabolic panel is normal.

## 2023-09-09 LAB — HM DIABETES EYE EXAM

## 2023-11-04 ENCOUNTER — Ambulatory Visit (INDEPENDENT_AMBULATORY_CARE_PROVIDER_SITE_OTHER): Payer: No Typology Code available for payment source | Admitting: Family Medicine

## 2023-11-04 ENCOUNTER — Encounter: Payer: Self-pay | Admitting: Family Medicine

## 2023-11-04 VITALS — BP 143/75 | HR 63 | Ht 62.0 in | Wt 194.0 lb

## 2023-11-04 DIAGNOSIS — E118 Type 2 diabetes mellitus with unspecified complications: Secondary | ICD-10-CM | POA: Diagnosis not present

## 2023-11-04 DIAGNOSIS — R7301 Impaired fasting glucose: Secondary | ICD-10-CM

## 2023-11-04 LAB — POCT GLYCOSYLATED HEMOGLOBIN (HGB A1C): Hemoglobin A1C: 6.4 % — AB (ref 4.0–5.6)

## 2023-11-04 MED ORDER — TRULICITY 0.75 MG/0.5ML ~~LOC~~ SOAJ: 0.7500 mg | SUBCUTANEOUS | 1 refills | Status: DC

## 2023-11-04 NOTE — Assessment & Plan Note (Addendum)
 Discussed options A1c 6.4 today on metformin .  We did discuss a GLP-1 is a potential option if her insurance would cover it I think it would help her with her weight loss.  Current BMI is 35 and help reduce her A1c she would really like to get it back under 6 if at all possible.  She does not feel like she could tolerate a higher dose of the metformin  because she already gets GI upset even on the 500mg .  Will see if we can get Trulicity covered with insurance.  Plan to follow-up in 3 to 4 months.  Did discuss how GLP-1's work and to monitor for constipation and other side effects.

## 2023-11-04 NOTE — Progress Notes (Addendum)
 Established Patient Office Visit  Subjective  Patient ID: Jessica Mullen, female    DOB: 02/05/1961  Age: 63 y.o. MRN: [id number]  Chief Complaint  Patient presents with   ifg   Allergic Rhinitis     HPI  For follow-up A1c the last time I saw her A1c had jumped up to 6.5 on metformin  which she had been on for over a year.  She was able to purchase on her own for short-term a Dexcom 7 and was able to monitor her glucose levels most of the time they ranged between 130 and 160 she never had any hypoglycemic events.  She has been really eating healthy she is cut out a lot of starches and carbs such as potatoes rice etc.  She has been walking regularly and she is actually much more active at work than she used to be.  She is just frustrated that she really has not been able to lose any weight.  In fact her weight is actually up about 6 pounds since I last saw her in December.  She is just frustrated with the changes that she is made that she has not made more progress with weight loss and with her A1c she is taking her metformin  regularly    ROS    Objective:     BP (!) 143/75   Pulse 63   Ht 5\' 2"  (1.575 m)   Wt 194 lb (88 kg)   SpO2 99%   BMI 35.48 kg/m    Physical Exam Vitals and nursing note reviewed.  Constitutional:      Appearance: Normal appearance.  HENT:     Head: Normocephalic and atraumatic.  Eyes:     Conjunctiva/sclera: Conjunctivae normal.  Cardiovascular:     Rate and Rhythm: Normal rate and regular rhythm.  Pulmonary:     Effort: Pulmonary effort is normal.     Breath sounds: Normal breath sounds.  Skin:    General: Skin is warm and dry.  Neurological:     Mental Status: She is alert.  Psychiatric:        Mood and Affect: Mood normal.      Results for orders placed or performed in visit on 11/04/23  POCT HgB A1C  Result Value Ref Range   Hemoglobin A1C 6.4 (A) 4.0 - 5.6 %   HbA1c POC (<> result, manual entry)     HbA1c, POC (prediabetic  range)     HbA1c, POC (controlled diabetic range)        The 10-year ASCVD risk score (Arnett DK, et al., 2019) is: 10%    Assessment & Plan:   Problem List Items Addressed This Visit       Endocrine   IFG (impaired fasting glucose) - Primary   Relevant Orders   POCT HgB A1C (Completed)   Controlled type 2 diabetes mellitus with complication, without long-term current use of insulin (HCC)   Discussed options A1c 6.4 today on metformin .  We did discuss a GLP-1 is a potential option if her insurance would cover it I think it would help her with her weight loss.  Current BMI is 35 and help reduce her A1c she would really like to get it back under 6 if at all possible.  She does not feel like she could tolerate a higher dose of the metformin  because she already gets GI upset even on the 500mg .  Will see if we can get Trulicity covered with insurance.  Plan to  follow-up in 3 to 4 months.  Did discuss how GLP-1's work and to monitor for constipation and other side effects.       That labs are up-to-date.  Will get a urine microalbumin at next office visit.  Return in about 3 months (around 02/11/2024) for Diabetes follow-up.    Duaine German, MD

## 2023-11-06 ENCOUNTER — Encounter: Payer: Self-pay | Admitting: Family Medicine

## 2023-11-06 DIAGNOSIS — E118 Type 2 diabetes mellitus with unspecified complications: Secondary | ICD-10-CM

## 2023-11-10 ENCOUNTER — Other Ambulatory Visit (HOSPITAL_COMMUNITY): Payer: Self-pay

## 2023-11-17 ENCOUNTER — Other Ambulatory Visit (HOSPITAL_COMMUNITY): Payer: Self-pay

## 2023-11-30 ENCOUNTER — Other Ambulatory Visit (HOSPITAL_COMMUNITY): Payer: Self-pay

## 2023-11-30 ENCOUNTER — Telehealth: Payer: Self-pay

## 2023-11-30 NOTE — Telephone Encounter (Signed)
 Pharmacy Patient Advocate Encounter   Received notification from Patient Pharmacy that prior authorization for Trulicity 0.75 is required/requested.   Insurance verification completed.   The patient is insured through [place] .   Per test claim: PA required; PA submitted to above mentioned insurance via CoverMyMeds Key/confirmation #/[facility] Status is pending

## 2023-12-01 ENCOUNTER — Other Ambulatory Visit (HOSPITAL_COMMUNITY): Payer: Self-pay

## 2023-12-01 NOTE — Telephone Encounter (Signed)
 Pharmacy Patient Advocate Encounter  Received notification from [employer] that Prior Authorization for Trulicity 0.75 has been DENIED.  Full denial letter will be uploaded to the media tab. See denial reason below.   PA #/Case ID/Reference #: [id number]

## 2023-12-03 ENCOUNTER — Telehealth: Payer: Self-pay | Admitting: Family Medicine

## 2023-12-03 NOTE — Telephone Encounter (Signed)
 Copied from CRM [id number]. Topic: General - Other >> Dec 03, 2023  3:29 PM Tisa Forester wrote: Reason for CRM: [facility] calling from [facility] - can call [phone] request to speak with Lynnie Saucier - patient has a form that need to be completed for diabetes program please have Lynnie Saucier to call [phone] the original  fax number is [phone] and the alternative fax number [phone] or can email

## 2023-12-07 NOTE — Telephone Encounter (Signed)
 Spoke w/Trinity and gave her the needed information for patient.

## 2023-12-14 NOTE — Telephone Encounter (Signed)
,   Can we resubmit for a heel and just put that she gets diarrhea with the metformin  per her note below and see if we can then get it approved.

## 2023-12-16 ENCOUNTER — Other Ambulatory Visit (HOSPITAL_COMMUNITY): Payer: Self-pay

## 2023-12-16 ENCOUNTER — Telehealth: Payer: Self-pay

## 2023-12-16 NOTE — Telephone Encounter (Signed)
 Pharmacy Patient Advocate Encounter   Received notification from Patient Pharmacy that prior authorization for Trulicity 0.75 is required/requested.   Insurance verification completed.   The patient is insured through [employer] .   Per test claim:patient has plan limitations. Please see encounter 11/30/23

## 2023-12-17 MED ORDER — TRULICITY 0.75 MG/0.5ML ~~LOC~~ SOAJ: 0.7500 mg | SUBCUTANEOUS | 0 refills | Status: DC

## 2023-12-17 NOTE — Telephone Encounter (Signed)
 Please initiate sppeal  I did update the note from March.

## 2023-12-18 ENCOUNTER — Other Ambulatory Visit (HOSPITAL_COMMUNITY): Payer: Self-pay

## 2023-12-21 NOTE — Telephone Encounter (Signed)
 It is controlled but she has diarrhea on it so if she is having a side effect .will they make an exemption?

## 2024-02-03 ENCOUNTER — Encounter: Payer: Self-pay | Admitting: Family Medicine

## 2024-02-03 ENCOUNTER — Ambulatory Visit (INDEPENDENT_AMBULATORY_CARE_PROVIDER_SITE_OTHER): Admitting: Family Medicine

## 2024-02-03 VITALS — BP 128/60 | HR 77 | Ht 62.0 in | Wt 188.1 lb

## 2024-02-03 DIAGNOSIS — E785 Hyperlipidemia, unspecified: Secondary | ICD-10-CM | POA: Diagnosis not present

## 2024-02-03 DIAGNOSIS — E1169 Type 2 diabetes mellitus with other specified complication: Secondary | ICD-10-CM | POA: Diagnosis not present

## 2024-02-03 DIAGNOSIS — E118 Type 2 diabetes mellitus with unspecified complications: Secondary | ICD-10-CM | POA: Diagnosis not present

## 2024-02-03 LAB — POCT GLYCOSYLATED HEMOGLOBIN (HGB A1C): Hemoglobin A1C: 6.1 % — AB (ref 4.0–5.6)

## 2024-02-03 MED ORDER — TRULICITY 1.5 MG/0.5ML ~~LOC~~ SOAJ: 1.5000 mg | SUBCUTANEOUS | 0 refills | Status: DC

## 2024-02-03 NOTE — Progress Notes (Signed)
 Called [facility] for her most recent eye exam. [phone]

## 2024-02-03 NOTE — Assessment & Plan Note (Signed)
 A1c looks great today at 6.1 she is doing really well and feels great on the medication we will going to bump the Trulicity  to 1.5 mg for the next month if she is still doing great we can even consider bumping to 3 mg after that she will let me know.  Continue to work on healthy diet regular exercise she is doing a great job with getting adequate protein intake N.  We discussed the importance of adding in some resistance training either with bands or weights.  Low up in 3 to 4 months.

## 2024-02-03 NOTE — Assessment & Plan Note (Signed)
 Last LDL was elevated at 131.  Plan to recheck again in the fall especially as she continues to do well and bringing her A1c down and losing weight.  Discussed current guideline recommendations for statin in all patients with diabetes.  Lab Results  Component Value Date   CHOL 214 (H) 07/29/2023   HDL 46 07/29/2023   LDLCALC 131 (H) 07/29/2023   TRIG 206 (H) 07/29/2023   CHOLHDL 3.8 07/22/2022    The 29-BMWU ASCVD risk score (Arnett DK, et al., 2019) is: 8.1%   Values used to calculate the score:     Age: 63 years     Clincally relevant sex: Female     Is Non-Hispanic African American: No     Diabetic: Yes     Tobacco smoker: No     Systolic Blood Pressure: 128 mmHg     Is BP treated: No     HDL Cholesterol: 46 mg/dL     Total Cholesterol: 214 mg/dL

## 2024-02-03 NOTE — Progress Notes (Signed)
 Established Patient Office Visit  Subjective  Patient ID: Jessica Mullen, female    DOB: 1961-06-06  Age: 63 y.o. MRN: [id number]  Chief Complaint  Patient presents with   Diabetes   Hypertension    HPI  Diabetes - no hypoglycemic events. No wounds or sores that are not healing well. No increased thirst or urination. Checking glucose at home. Taking medications as prescribed without any side effects. Down 6 lbs.   she has been really excited about the Trulicity .  She said she feels great on it.  She really has not had any side effects she has a history of IBS-D so it actually helped her regulate her bowels which has been pretty exciting for her.  She does wear a continuous glucose meter her current estimated A1c is 6.4 per her monitor.      ROS    Objective:     BP 128/60   Pulse 77   Ht 5' 2 (1.575 m)   Wt 188 lb 1.9 oz (85.3 kg)   SpO2 97%   BMI 34.41 kg/m    Physical Exam Vitals reviewed.  Constitutional:      Appearance: Normal appearance.  HENT:     Head: Normocephalic.  Pulmonary:     Effort: Pulmonary effort is normal.   Neurological:     Mental Status: She is alert and oriented to person, place, and time.   Psychiatric:        Mood and Affect: Mood normal.        Behavior: Behavior normal.      Results for orders placed or performed in visit on 02/03/24  POCT HgB A1C  Result Value Ref Range   Hemoglobin A1C 6.1 (A) 4.0 - 5.6 %   HbA1c POC (<> result, manual entry)     HbA1c, POC (prediabetic range)     HbA1c, POC (controlled diabetic range)        The 10-year ASCVD risk score (Arnett DK, et al., 2019) is: 8.1%    Assessment & Plan:   Problem List Items Addressed This Visit       Endocrine   Hyperlipidemia associated with type 2 diabetes mellitus (HCC)   Last LDL was elevated at 131.  Plan to recheck again in the fall especially as she continues to do well and bringing her A1c down and losing weight.  Discussed current guideline  recommendations for statin in all patients with diabetes.  Lab Results  Component Value Date   CHOL 214 (H) 07/29/2023   HDL 46 07/29/2023   LDLCALC 131 (H) 07/29/2023   TRIG 206 (H) 07/29/2023   CHOLHDL 3.8 07/22/2022    The 78-GNFA ASCVD risk score (Arnett DK, et al., 2019) is: 8.1%   Values used to calculate the score:     Age: 57 years     Clincally relevant sex: Female     Is Non-Hispanic African American: No     Diabetic: Yes     Tobacco smoker: No     Systolic Blood Pressure: 128 mmHg     Is BP treated: No     HDL Cholesterol: 46 mg/dL     Total Cholesterol: 214 mg/dL       Relevant Medications   Dulaglutide  (TRULICITY ) 1.5 MG/0.5ML SOAJ   Controlled type 2 diabetes mellitus with complication, without long-term current use of insulin (HCC) - Primary   A1c looks great today at 6.1 she is doing really well and feels great on the medication  we will going to bump the Trulicity  to 1.5 mg for the next month if she is still doing great we can even consider bumping to 3 mg after that she will let me know.  Continue to work on healthy diet regular exercise she is doing a great job with getting adequate protein intake N.  We discussed the importance of adding in some resistance training either with bands or weights.  Low up in 3 to 4 months.      Relevant Medications   Dulaglutide  (TRULICITY ) 1.5 MG/0.5ML SOAJ   Other Relevant Orders   POCT HgB A1C (Completed)   Urine Microalbumin w/creat. ratio   CMP14+EGFR    Return in about 3 months (around 05/12/2024) for Diabetes follow-up.    Duaine German, MD

## 2024-02-04 LAB — CMP14+EGFR
ALT: 18 IU/L (ref 0–32)
AST: 19 IU/L (ref 0–40)
Albumin: 4.6 g/dL (ref 3.9–4.9)
Alkaline Phosphatase: 88 IU/L (ref 44–121)
BUN/Creatinine Ratio: 19 (ref 12–28)
BUN: 14 mg/dL (ref 8–27)
Bilirubin Total: 0.3 mg/dL (ref 0.0–1.2)
CO2: 22 mmol/L (ref 20–29)
Calcium: 9.8 mg/dL (ref 8.7–10.3)
Chloride: 103 mmol/L (ref 96–106)
Creatinine, Ser: 0.74 mg/dL (ref 0.57–1.00)
Globulin, Total: 2.5 g/dL (ref 1.5–4.5)
Glucose: 93 mg/dL (ref 70–99)
Potassium: 4.4 mmol/L (ref 3.5–5.2)
Sodium: 142 mmol/L (ref 134–144)
Total Protein: 7.1 g/dL (ref 6.0–8.5)
eGFR: 92 mL/min/{1.73_m2} (ref >59)

## 2024-02-04 LAB — MICROALBUMIN / CREATININE URINE RATIO
Creatinine, Urine: 88.2 mg/dL
Microalb/Creat Ratio: 7 mg/g{creat} (ref 0–29)
Microalbumin, Urine: 6.4 ug/mL

## 2024-02-05 ENCOUNTER — Ambulatory Visit: Payer: Self-pay | Admitting: Family Medicine

## 2024-02-05 NOTE — Progress Notes (Signed)
 No excess protein in the urine which is great.  Metabolic panel looks great.

## 2024-03-10 ENCOUNTER — Other Ambulatory Visit: Payer: Self-pay | Admitting: Family Medicine

## 2024-03-10 DIAGNOSIS — E118 Type 2 diabetes mellitus with unspecified complications: Secondary | ICD-10-CM

## 2024-03-16 ENCOUNTER — Telehealth: Payer: Self-pay

## 2024-03-16 NOTE — Telephone Encounter (Signed)
 I cannot send labs without a signed release from patient. I called [facility] back and advised her and she is going to get the patient to sign a medical release form.

## 2024-03-16 NOTE — Telephone Encounter (Signed)
 Copied from CRM [id number]. Topic: Clinical - Lab/Test Results >> Mar 16, 2024  2:23 PM Carmell SAUNDERS wrote: Reason for CRM: Patient is participating in the diabetes mgmt program and [facility] needs to have the updated A1C value for this patient. Fax# [phone]

## 2024-04-11 ENCOUNTER — Telehealth: Payer: Self-pay | Admitting: Family Medicine

## 2024-04-11 NOTE — Telephone Encounter (Signed)
 Copied from CRM [id number]. Topic: Clinical - Medication Question >> Apr 11, 2024 11:45 AM Kevelyn M wrote: Reason for CRM: Camie with [facility] needs the patient's most recent A1C lab faxed over.  Fax#:[phone] Call#:[phone]

## 2024-04-20 ENCOUNTER — Telehealth: Payer: Self-pay

## 2024-04-20 NOTE — Telephone Encounter (Signed)
 Attempted call to patient regarding no record release signed to fax this information in patient chart.  Left a voice mail message requesting a return call.

## 2024-04-20 NOTE — Telephone Encounter (Signed)
 Spoke with patient . Gave verbal consent to send fax And will sign release at her next upcoming appt on 05/04/2024  Form faxed =kh

## 2024-04-20 NOTE — Telephone Encounter (Signed)
 Copied from CRM [id number]. Topic: Clinical - Medication Question >> Apr 11, 2024 11:45 AM Kevelyn M wrote: Reason for CRM: Camie with [facility] needs the patient's most recent A1C lab faxed over.  Fax#:[phone] Call#:[phone] >> Apr 20, 2024 11:28 AM Marda MATSU wrote: Camie with [facility] Calling on status of labs being faxed to [facility] for patient to continue in the Diabetes Management program  fax# [phone]

## 2024-04-21 NOTE — Telephone Encounter (Signed)
 Duplicate Request -   Per Jessica Mullen's note on 04/20/24 - Spoke with patient . Gave verbal consent to send fax  And will sign release at her next upcoming appt on 05/04/2024   Form faxed =kh

## 2024-04-21 NOTE — Telephone Encounter (Unsigned)
 Copied from CRM [id number]. Topic: Clinical - Medication Question >> Apr 11, 2024 11:45 AM Kevelyn M wrote: Reason for CRM: Camie with [facility] needs the patient's most recent A1C lab faxed over.  Fax#:[phone] Call#:[phone] >> Apr 20, 2024 11:28 AM Marda MATSU wrote: Camie with [facility] Calling on status of labs being faxed to [facility] for patient to continue in the Diabetes Management program  fax# [phone]

## 2024-04-29 NOTE — Telephone Encounter (Signed)
 Results sent. Confirmation received

## 2024-05-04 ENCOUNTER — Encounter: Payer: Self-pay | Admitting: Family Medicine

## 2024-05-04 ENCOUNTER — Ambulatory Visit (INDEPENDENT_AMBULATORY_CARE_PROVIDER_SITE_OTHER): Admitting: Family Medicine

## 2024-05-04 VITALS — BP 118/78 | HR 71 | Ht 62.0 in | Wt 185.0 lb

## 2024-05-04 DIAGNOSIS — E118 Type 2 diabetes mellitus with unspecified complications: Secondary | ICD-10-CM

## 2024-05-04 DIAGNOSIS — Z23 Encounter for immunization: Secondary | ICD-10-CM | POA: Diagnosis not present

## 2024-05-04 DIAGNOSIS — Z6833 Body mass index (BMI) 33.0-33.9, adult: Secondary | ICD-10-CM | POA: Diagnosis not present

## 2024-05-04 LAB — POCT GLYCOSYLATED HEMOGLOBIN (HGB A1C): Hemoglobin A1C: 5.9 % — AB (ref 4.0–5.6)

## 2024-05-04 MED ORDER — TRULICITY 3 MG/0.5ML ~~LOC~~ SOAJ: 3.0000 mg | SUBCUTANEOUS | 1 refills | Status: DC

## 2024-05-04 NOTE — Progress Notes (Signed)
 Established Patient Office Visit  Subjective  Patient ID: Jessica Mullen, female    DOB: 03-Aug-1961  Age: 63 y.o. MRN: [id number]  Chief Complaint  Patient presents with   Diabetes   Hypertension    HPI  Discussed the use of AI scribe software for clinical note transcription with the patient, who gave verbal consent to proceed.  History of Present Illness CHELLY Mullen is a 63 year old female with diabetes who presents for follow-up on her A1c and weight management.  Glycemic control - A1c is 5.9%. - Continuous glucose monitor (CGM) readings are slightly higher than A1c. - No significant nausea with current medication regimen.  Weight management - Gradual weight loss of approximately 1 pound per month. - Monitors muscle and bone mass using a scale.  Constipation - Constipation occurs particularly after injection. - Symptoms improve with increased water intake, dietary fiber, and physical activity. - Physical activity leads to improved bowel movements.  Physical activity - Walks frequently at work and goes for walks a couple of times per week. - Engages in less strength training than previously.     ROS    Objective:     BP 118/78 (BP Location: Right Arm, Cuff Size: Large)   Pulse 71   Ht 5' 2 (1.575 m)   Wt 185 lb (83.9 kg)   SpO2 100%   BMI 33.84 kg/m    Physical Exam Vitals and nursing note reviewed.  Constitutional:      Appearance: Normal appearance.  HENT:     Head: Normocephalic and atraumatic.  Eyes:     Conjunctiva/sclera: Conjunctivae normal.  Cardiovascular:     Rate and Rhythm: Normal rate and regular rhythm.  Pulmonary:     Effort: Pulmonary effort is normal.     Breath sounds: Normal breath sounds.  Skin:    General: Skin is warm and dry.  Neurological:     Mental Status: She is alert.  Psychiatric:        Mood and Affect: Mood normal.      Results for orders placed or performed in visit on 05/04/24  POCT HgB A1C   Result Value Ref Range   Hemoglobin A1C 5.9 (A) 4.0 - 5.6 %   HbA1c POC (<> result, manual entry)     HbA1c, POC (prediabetic range)     HbA1c, POC (controlled diabetic range)        The 10-year ASCVD risk score (Arnett DK, et al., 2019) is: 7%    Assessment & Plan:   Problem List Items Addressed This Visit       Endocrine   Controlled type 2 diabetes mellitus with complication, without long-term current use of insulin (HCC) - Primary   Type 2 diabetes mellitus Well-controlled with A1c of 5.9%. - Increase medication dose to 3 mg for weight loss. Monitor for constipation and nausea. - Encourage regular protein intake with meals. - Monitor for constipation; consider stool softener or Miralax if needed. - Consider starting statin for cardiovascular risk reduction. - Urine microalbumin is normal.       Relevant Medications   Dulaglutide  (TRULICITY ) 3 MG/0.5ML SOAJ   Other Relevant Orders   POCT HgB A1C (Completed)   Other Visit Diagnoses       Encounter for immunization       Relevant Orders   Pneumococcal conjugate vaccine 20-valent (Completed)     BMI 33.0-33.9,adult          Assessment and Plan Assessment & Plan  Overweight/BMI 33  Gradual weight loss of one pound per month. Interested in more aggressive weight loss. - Increase medication dose to 3 mg for weight loss. - Incorporate strength training exercises twice a week for 20 minutes. - Continue regular walking and physical activity.  General Health Maintenance Plans for flu shot at work and pneumococcal vaccine today. Mammogram scheduled for next month. - Administer Prevnar 20 pneumococcal vaccine today. - Receive flu shot at work. - Complete scheduled mammogram next month.   Return in about 4 months (around 09/05/2024) for Diabetes follow-up.    Jessica Byars, MD

## 2024-05-04 NOTE — Assessment & Plan Note (Signed)
 Type 2 diabetes mellitus Well-controlled with A1c of 5.9%. - Increase medication dose to 3 mg for weight loss. Monitor for constipation and nausea. - Encourage regular protein intake with meals. - Monitor for constipation; consider stool softener or Miralax if needed. - Consider starting statin for cardiovascular risk reduction. - Urine microalbumin is normal.

## 2024-06-15 ENCOUNTER — Encounter: Payer: Self-pay | Admitting: Family Medicine

## 2024-06-15 DIAGNOSIS — E118 Type 2 diabetes mellitus with unspecified complications: Secondary | ICD-10-CM

## 2024-06-17 MED ORDER — DEXCOM G7 SENSOR MISC: 1 refills | Status: AC

## 2024-06-17 NOTE — Telephone Encounter (Signed)
 Ok to send Rx for Dexcom 7

## 2024-06-17 NOTE — Telephone Encounter (Signed)
 Prescription for Dexcom G7  ( 10 day ) has been sent to pharmacy .

## 2024-08-02 ENCOUNTER — Other Ambulatory Visit: Payer: Self-pay | Admitting: Family Medicine

## 2024-08-02 DIAGNOSIS — Z1231 Encounter for screening mammogram for malignant neoplasm of breast: Secondary | ICD-10-CM

## 2024-08-03 ENCOUNTER — Ambulatory Visit: Payer: No Typology Code available for payment source | Admitting: Family Medicine

## 2024-08-03 ENCOUNTER — Encounter: Payer: Self-pay | Admitting: Family Medicine

## 2024-08-03 VITALS — BP 136/82 | HR 75 | Ht 62.0 in | Wt 184.8 lb

## 2024-08-03 DIAGNOSIS — E1169 Type 2 diabetes mellitus with other specified complication: Secondary | ICD-10-CM | POA: Diagnosis not present

## 2024-08-03 DIAGNOSIS — E118 Type 2 diabetes mellitus with unspecified complications: Secondary | ICD-10-CM

## 2024-08-03 DIAGNOSIS — E785 Hyperlipidemia, unspecified: Secondary | ICD-10-CM

## 2024-08-03 DIAGNOSIS — Z0000 Encounter for general adult medical examination without abnormal findings: Secondary | ICD-10-CM

## 2024-08-03 LAB — POCT GLYCOSYLATED HEMOGLOBIN (HGB A1C): HbA1c, POC (controlled diabetic range): 5.9 % (ref 0.0–7.0)

## 2024-08-03 MED ORDER — TRULICITY 4.5 MG/0.5ML ~~LOC~~ SOAJ: 4.5000 mg | SUBCUTANEOUS | 1 refills | Status: AC

## 2024-08-03 MED ORDER — ROSUVASTATIN CALCIUM 10 MG PO TABS: 10.0000 mg | ORAL_TABLET | Freq: Every day | ORAL | 3 refills | Status: AC

## 2024-08-03 NOTE — Progress Notes (Signed)
 Complete physical exam  Patient: Jessica Mullen    DOB: 02-26-1961 63 y.o.   MRN: [id number]  Chief Complaint  Patient presents with   Diabetes    Diabetes follow up visit- Patient states she has had flu shot with employer around end of October.  Declines shingles vaccine today     Subjective:     Discussed the use of AI scribe software for clinical note transcription with the patient, who gave verbal consent to proceed.  History of Present Illness AKANE TESSIER is a 63 year old female with diabetes who presents for routine follow-up and management of her condition.  Glycemic control and diabetes management - Diabetes managed with current medication regimen and glucose monitoring. - Currently using a Stelo monitor; plans to resume Dexcom in approximately two weeks. - Dexcom provides hypoglycemia alerts, which Stelo does not. - No significant hypoglycemic episodes; lowest glucose readings in the 60s mg/dL. - Symptoms of hyperglycemia include sleepiness and irritability. - Difficulty with word retrieval, associated with high glucose levels. - A1c checked via finger stick; awaiting laboratory results. - No non-healing cuts, sores, or wounds observed.  Immunization status - Shingles vaccine not yet received. - Plans to schedule vaccine when able to take time off, anticipating possible side effects after the second dose.  Ophthalmologic health - Due for an eye exam in January or February; plans to schedule soon.  Lipid monitoring and musculoskeletal symptoms - Cholesterol levels last checked December of previous year; due for reassessment. - Muscle aches present in the left shoulder.  Peripheral sensory symptoms - Cold feet, especially at night. - Uses a heating pad for symptomatic relief.  Appetite and weight management - Current medication regimen assists with appetite and weight control. - Shares meals with her grandson and uses smaller plates to manage  portion sizes.  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 - Cautious with medications that may impact hepatic function.   Most recent fall risk assessment:    08/03/2024   10:47 AM  Fall Risk   Falls in the past year? 0  Number falls in past yr: 0  Injury with Fall? 0  Risk for fall due to : No Fall Risks  Follow up Falls evaluation completed     Most recent depression screenings:    08/03/2024   10:47 AM 11/04/2023   10:59 AM  PHQ 2/9 Scores  PHQ - 2 Score 1 0  PHQ- 9 Score 6         Patient Care Team: Alvan Dorothyann BIRCH, MD as PCP - General   ROS    Objective:    BP 136/82   Pulse 75   Ht 5' 2 (1.575 m)   Wt 184 lb 12 oz (83.8 kg)   SpO2 99%   BMI 33.79 kg/m     Physical Exam Constitutional:      Appearance: Normal appearance.  HENT:     Head: Normocephalic and atraumatic.     Right Ear: Tympanic membrane, ear canal and external ear normal.     Left Ear: Tympanic membrane, ear canal and external ear normal.     Nose: Nose normal.     Mouth/Throat:     Pharynx: Oropharynx is clear.  Eyes:     Extraocular Movements: Extraocular movements intact.     Conjunctiva/sclera: Conjunctivae normal.     Pupils: Pupils are equal, round, and reactive to light.  Neck:  Thyroid : No thyromegaly.  Cardiovascular:     Rate and Rhythm: Normal rate and regular rhythm.  Pulmonary:     Effort: Pulmonary effort is normal.     Breath sounds: Normal breath sounds.  Abdominal:     General: Bowel sounds are normal.     Palpations: Abdomen is soft.     Tenderness: There is no abdominal tenderness.  Musculoskeletal:        General: No swelling.     Cervical back: Neck supple.  Skin:    General: Skin is warm and dry.  Neurological:     Mental Status: She is oriented to person, place, and time.  Psychiatric:        Mood and Affect: Mood normal.        Behavior: Behavior normal.        Results for orders placed or performed in visit on 08/03/24  POCT HgB A1C  Result Value Ref Range   Hemoglobin A1C     HbA1c POC (<> result, manual entry)     HbA1c, POC (prediabetic range)     HbA1c, POC (controlled diabetic range) 5.9 0.0 - 7.0 %        Assessment & Plan:    Routine Health Maintenance and Physical Exam Immunization History  Administered Date(s) Administered   Influenza, Seasonal, Injecte, Preservative Fre 05/19/2023   Influenza-Unspecified 05/18/2017, 05/18/2018, 05/19/2019, 06/17/2024   Moderna Sars-Covid-2 Vaccination 12/09/2019, 01/06/2020   PNEUMOCOCCAL CONJUGATE-20 05/04/2024   Tdap 09/29/2019    Health Maintenance  Topic Date Due   Zoster Vaccines- Shingrix (1 of 2) Never done   COVID-19 Vaccine (3 - 2025-26 season) 04/18/2024   Mammogram  06/02/2024   OPHTHALMOLOGY EXAM  09/08/2024   HEMOGLOBIN A1C  02/01/2025   Diabetic kidney evaluation - eGFR measurement  02/02/2025   Diabetic kidney evaluation - Urine ACR  02/02/2025   FOOT EXAM  02/02/2025   Colonoscopy  07/24/2026   Cervical Cancer Screening (HPV/Pap Cotest)  07/23/2027   DTaP/Tdap/Td (2 - Td or Tdap) 09/28/2029   Pneumococcal Vaccine: 50+ Years  Completed   Influenza Vaccine  Completed   Hepatitis C Screening  Completed   HIV Screening  Completed   Hepatitis B Vaccines 19-59 Average Risk  Aged Out   HPV VACCINES  Aged Out   Meningococcal B Vaccine  Aged Out    Discussed health benefits of physical activity, and encouraged her to engage in regular exercise appropriate for her age and condition.  Problem List Items Addressed This Visit       Endocrine   Hyperlipidemia associated with type 2 diabetes mellitus (HCC) - Primary   Type 2 diabetes mellitus with hyperlipidemia Diabetes well-controlled with A1c of 5.9%. Hyperlipidemia discussed as a cardiovascular risk factor. Reassured her about statin therapy options despite past muscle aches. Emphasized liver function monitoring due  to past CMV infection. - Started Crestor  (rosuvastatin ) with dose titration option. - Ordered lipid panel. - Monitor liver function tests in two months.      Relevant Medications   rosuvastatin  (CRESTOR ) 10 MG tablet   Dulaglutide  (TRULICITY ) 4.5 MG/0.5ML SOAJ   Other Relevant Orders   CMP14+EGFR   Lipid Panel With LDL/HDL Ratio   CBC   Controlled type 2 diabetes mellitus with complication, without long-term current use of insulin (HCC)   A1C looks great!!!! Will bump up Trulicity  to 4.5mg  daily.   Discussed need for statin.   She is ok to start Crestor .       Relevant  Medications   rosuvastatin  (CRESTOR ) 10 MG tablet   Dulaglutide  (TRULICITY ) 4.5 MG/0.5ML SOAJ   Other Relevant Orders   CMP14+EGFR   Lipid Panel With LDL/HDL Ratio   POCT HgB A1C (Completed)   CBC    Assessment and Plan Assessment & Plan   Overweight BMI 33. She reports reduced hunger and beneficial meal splitting. -- Increase Trulicity  to 4.5mg , monitor for constipation on the medication.   General Health Maintenance Discussed regular screenings and vaccinations. Mammogram scheduled for October. Eye exam planned for January or February. Discussed shingles vaccine timing and side effects. - Ensure mammogram completion in October. - Schedule eye exam in January or February. - Consider shingles vaccine timing.    Return in about 24 weeks (around 01/18/2025) for Diabetes follow-up, Hypertension.    Dorothyann Byars, MD [facility] Primary Care & Sports Medicine at [facility]

## 2024-08-03 NOTE — Assessment & Plan Note (Signed)
 A1C looks great!!!! Will bump up Trulicity  to 4.5mg  daily.   Discussed need for statin.   She is ok to start Crestor .

## 2024-08-03 NOTE — Patient Instructions (Signed)
 Recheck liver and lipids in 2 months on the medication.

## 2024-08-03 NOTE — Assessment & Plan Note (Signed)
 Type 2 diabetes mellitus with hyperlipidemia Diabetes well-controlled with A1c of 5.9%. Hyperlipidemia discussed as a cardiovascular risk factor. Reassured her about statin therapy options despite past muscle aches. Emphasized liver function monitoring due to past CMV infection. - Started Crestor  (rosuvastatin ) with dose titration option. - Ordered lipid panel. - Monitor liver function tests in two months.

## 2024-08-04 ENCOUNTER — Ambulatory Visit: Payer: Self-pay | Admitting: Family Medicine

## 2024-08-04 LAB — CBC
Hematocrit: 43 % (ref 34.0–46.6)
Hemoglobin: 14 g/dL (ref 11.1–15.9)
MCH: 29.4 pg (ref 26.6–33.0)
MCHC: 32.6 g/dL (ref 31.5–35.7)
MCV: 90 fL (ref 79–97)
Platelets: 302 x10E3/uL (ref 150–450)
RBC: 4.76 x10E6/uL (ref 3.77–5.28)
RDW: 12.7 % (ref 11.7–15.4)
WBC: 8.3 x10E3/uL (ref 3.4–10.8)

## 2024-08-04 LAB — CMP14+EGFR
ALT: 18 IU/L (ref 0–32)
AST: 14 IU/L (ref 0–40)
Albumin: 4.2 g/dL (ref 3.9–4.9)
Alkaline Phosphatase: 80 IU/L (ref 49–135)
BUN/Creatinine Ratio: 19 (ref 12–28)
BUN: 15 mg/dL (ref 8–27)
Bilirubin Total: 0.5 mg/dL (ref 0.0–1.2)
CO2: 25 mmol/L (ref 20–29)
Calcium: 9.4 mg/dL (ref 8.7–10.3)
Chloride: 102 mmol/L (ref 96–106)
Creatinine, Ser: 0.77 mg/dL (ref 0.57–1.00)
Globulin, Total: 2.4 g/dL (ref 1.5–4.5)
Glucose: 98 mg/dL (ref 70–99)
Potassium: 4.4 mmol/L (ref 3.5–5.2)
Sodium: 140 mmol/L (ref 134–144)
Total Protein: 6.6 g/dL (ref 6.0–8.5)
eGFR: 88 mL/min/1.73 (ref >59)

## 2024-08-04 LAB — LIPID PANEL WITH LDL/HDL RATIO
Cholesterol, Total: 179 mg/dL (ref 100–199)
HDL: 47 mg/dL (ref >39)
LDL Chol Calc (NIH): 108 mg/dL — ABNORMAL HIGH (ref 0–99)
LDL/HDL Ratio: 2.3 ratio (ref 0.0–3.2)
Triglycerides: 136 mg/dL (ref 0–149)
VLDL Cholesterol Cal: 24 mg/dL (ref 5–40)

## 2024-08-04 NOTE — Progress Notes (Signed)
 Hi Ondrea, LDL looks much better down to 108 and your total cholesterol and triglycerides look great as well.  Blood count and metabolic panel look great.

## 2024-08-05 ENCOUNTER — Ambulatory Visit (INDEPENDENT_AMBULATORY_CARE_PROVIDER_SITE_OTHER)

## 2024-08-05 DIAGNOSIS — Z1231 Encounter for screening mammogram for malignant neoplasm of breast: Secondary | ICD-10-CM | POA: Diagnosis not present

## 2024-08-09 ENCOUNTER — Ambulatory Visit: Payer: Self-pay | Admitting: Family Medicine

## 2024-08-09 NOTE — Progress Notes (Signed)
 Please call patient. Normal mammogram.  Repeat in 1 year.

## 2024-08-31 ENCOUNTER — Encounter: Payer: Self-pay | Admitting: Family Medicine

## 2024-08-31 ENCOUNTER — Ambulatory Visit (INDEPENDENT_AMBULATORY_CARE_PROVIDER_SITE_OTHER): Admitting: Family Medicine

## 2024-08-31 VITALS — BP 124/58 | HR 72 | Ht 62.0 in | Wt 179.0 lb

## 2024-08-31 DIAGNOSIS — Z0000 Encounter for general adult medical examination without abnormal findings: Secondary | ICD-10-CM | POA: Diagnosis not present

## 2024-08-31 NOTE — Progress Notes (Signed)
 "  Complete physical exam  Patient: Jessica Mullen    DOB: 1960-11-22 64 y.o.   MRN: [id number]  Chief Complaint  Patient presents with   Annual Exam    Subjective:    Jessica Mullen is a 64 y.o. female who presents today for a complete physical exam. She reports consuming a general diet with lots of veggies. Working on [employer].  Has been doing resistance training. She generally feels well. She reports sleeping fairly well. She does not have additional problems to discuss today.     Most recent fall risk assessment:    08/03/2024   10:47 AM  Fall Risk   Falls in the past year? 0  Number falls in past yr: 0  Injury with Fall? 0  Risk for fall due to : No Fall Risks  Follow up Falls evaluation completed     Most recent depression screenings:    08/31/2024    9:52 AM 08/03/2024   10:47 AM  PHQ 2/9 Scores  PHQ - 2 Score 1 1  PHQ- 9 Score 5 6        Patient Care Team: Alvan Dorothyann BIRCH, MD as PCP - General   ROS    Objective:    BP (!) 124/58   Pulse 72   Ht 5' 2 (1.575 m)   Wt 179 lb (81.2 kg)   SpO2 97%   BMI 32.74 kg/m     Physical Exam Constitutional:      Appearance: Normal appearance.  HENT:     Head: Normocephalic and atraumatic.     Right Ear: Tympanic membrane, ear canal and external ear normal.     Left Ear: Tympanic membrane, ear canal and external ear normal.     Nose: Nose normal.     Mouth/Throat:     Pharynx: Oropharynx is clear.  Eyes:     Extraocular Movements: Extraocular movements intact.     Conjunctiva/sclera: Conjunctivae normal.     Pupils: Pupils are equal, round, and reactive to light.  Neck:     Thyroid : No thyromegaly.  Cardiovascular:     Rate and Rhythm: Normal rate and regular rhythm.  Pulmonary:     Effort: Pulmonary effort is normal.     Breath sounds: Normal breath sounds.  Abdominal:     General: Bowel sounds are normal.     Palpations: Abdomen is soft.     Tenderness: There is no abdominal  tenderness.  Musculoskeletal:        General: No swelling.     Cervical back: Neck supple.  Skin:    General: Skin is warm and dry.  Neurological:     Mental Status: She is oriented to person, place, and time.  Psychiatric:        Mood and Affect: Mood normal.        Behavior: Behavior normal.       No results found for any visits on 08/31/24.      Assessment & Plan:    Routine Health Maintenance and Physical Exam Immunization History  Administered Date(s) Administered   Influenza, Seasonal, Injecte, Preservative Fre 05/19/2023   Influenza-Unspecified 05/18/2017, 05/18/2018, 05/19/2019, 06/17/2024   Moderna Sars-Covid-2 Vaccination 12/09/2019, 01/06/2020   PNEUMOCOCCAL CONJUGATE-20 05/04/2024   Tdap 09/29/2019    Health Maintenance  Topic Date Due   Zoster Vaccines- Shingrix (1 of 2) Never done   COVID-19 Vaccine (3 - 2025-26 season) 04/18/2024   OPHTHALMOLOGY EXAM  09/08/2024   HEMOGLOBIN A1C  02/01/2025   Diabetic kidney evaluation - Urine ACR  02/02/2025   FOOT EXAM  02/02/2025   Diabetic kidney evaluation - eGFR measurement  08/03/2025   Mammogram  08/05/2025   Colonoscopy  07/24/2026   Cervical Cancer Screening (HPV/Pap Cotest)  07/23/2027   DTaP/Tdap/Td (2 - Td or Tdap) 09/28/2029   Pneumococcal Vaccine: 50+ Years  Completed   Influenza Vaccine  Completed   Hepatitis C Screening  Completed   HIV Screening  Completed   Hepatitis B Vaccines 19-59 Average Risk  Aged Out   HPV VACCINES  Aged Out   Meningococcal B Vaccine  Aged Out    Discussed health benefits of physical activity, and encouraged her to engage in regular exercise appropriate for her age and condition.  Problem List Items Addressed This Visit   None Visit Diagnoses       Wellness examination    -  Primary      Doing well.  Labs are up to date.   Keep up the exercise.   Discussed need for shingles vaccine.      No follow-ups on file.    Dorothyann Byars, MD [facility]  Primary Care & Sports Medicine at [facility]    "

## 2025-01-18 ENCOUNTER — Ambulatory Visit: Admitting: Family Medicine
